# Patient Record
Sex: Female | Born: 1960 | Race: White | Hispanic: Yes | Smoking: Never smoker
Health system: Southern US, Community
[De-identification: ages and names within clinical notes are randomized; demographics above are authoritative.]

## PROBLEM LIST (undated history)

## (undated) DIAGNOSIS — Z8719 Personal history of other diseases of the digestive system: Secondary | ICD-10-CM

## (undated) DIAGNOSIS — R0989 Other specified symptoms and signs involving the circulatory and respiratory systems: Secondary | ICD-10-CM

## (undated) DIAGNOSIS — N920 Excessive and frequent menstruation with regular cycle: Secondary | ICD-10-CM

## (undated) DIAGNOSIS — K2270 Barrett's esophagus without dysplasia: Secondary | ICD-10-CM

## (undated) DIAGNOSIS — Z862 Personal history of diseases of the blood and blood-forming organs and certain disorders involving the immune mechanism: Secondary | ICD-10-CM

## (undated) DIAGNOSIS — N840 Polyp of corpus uteri: Secondary | ICD-10-CM

## (undated) DIAGNOSIS — G4730 Sleep apnea, unspecified: Secondary | ICD-10-CM

## (undated) DIAGNOSIS — R09A2 Foreign body sensation, throat: Secondary | ICD-10-CM

## (undated) DIAGNOSIS — L719 Rosacea, unspecified: Secondary | ICD-10-CM

## (undated) DIAGNOSIS — O039 Complete or unspecified spontaneous abortion without complication: Secondary | ICD-10-CM

## (undated) DIAGNOSIS — K219 Gastro-esophageal reflux disease without esophagitis: Secondary | ICD-10-CM

## (undated) DIAGNOSIS — C50912 Malignant neoplasm of unspecified site of left female breast: Secondary | ICD-10-CM

## (undated) DIAGNOSIS — R0602 Shortness of breath: Secondary | ICD-10-CM

## (undated) DIAGNOSIS — R198 Other specified symptoms and signs involving the digestive system and abdomen: Secondary | ICD-10-CM

## (undated) DIAGNOSIS — A048 Other specified bacterial intestinal infections: Secondary | ICD-10-CM

## (undated) DIAGNOSIS — M19049 Primary osteoarthritis, unspecified hand: Secondary | ICD-10-CM

## (undated) DIAGNOSIS — D219 Benign neoplasm of connective and other soft tissue, unspecified: Secondary | ICD-10-CM

## (undated) DIAGNOSIS — E785 Hyperlipidemia, unspecified: Secondary | ICD-10-CM

## (undated) HISTORY — DX: Gastro-esophageal reflux disease without esophagitis: K21.9

## (undated) HISTORY — PX: MYOMECTOMY ABDOMINAL APPROACH: SUR870

## (undated) HISTORY — DX: Barrett's esophagus without dysplasia: K22.70

## (undated) HISTORY — DX: Sleep apnea, unspecified: G47.30

## (undated) HISTORY — PX: DILATION AND CURETTAGE OF UTERUS: SHX78

## (undated) HISTORY — DX: Complete or unspecified spontaneous abortion without complication: O03.9

## (undated) HISTORY — PX: MYOMECTOMY: SHX85

## (undated) HISTORY — DX: Other specified symptoms and signs involving the circulatory and respiratory systems: R09.89

## (undated) HISTORY — PX: APPENDECTOMY: SHX54

## (undated) HISTORY — DX: Rosacea, unspecified: L71.9

## (undated) HISTORY — PX: WISDOM TOOTH EXTRACTION: SHX21

## (undated) HISTORY — PX: ESOPHAGOGASTRODUODENOSCOPY: SHX1529

## (undated) HISTORY — DX: Other specified bacterial intestinal infections: A04.8

## (undated) HISTORY — DX: Excessive and frequent menstruation with regular cycle: N92.0

## (undated) HISTORY — DX: Benign neoplasm of connective and other soft tissue, unspecified: D21.9

## (undated) HISTORY — DX: Foreign body sensation, throat: R09.A2

## (undated) HISTORY — DX: Other specified symptoms and signs involving the digestive system and abdomen: R19.8

---

## 2004-03-01 ENCOUNTER — Other Ambulatory Visit: Admission: RE | Admit: 2004-03-01 | Discharge: 2004-03-01 | Payer: Self-pay | Admitting: Obstetrics and Gynecology

## 2004-09-18 ENCOUNTER — Inpatient Hospital Stay (HOSPITAL_COMMUNITY): Admission: AD | Admit: 2004-09-18 | Discharge: 2004-09-18 | Payer: Self-pay | Admitting: Obstetrics and Gynecology

## 2004-09-24 ENCOUNTER — Ambulatory Visit (HOSPITAL_COMMUNITY): Admission: RE | Admit: 2004-09-24 | Discharge: 2004-09-24 | Payer: Self-pay | Admitting: Obstetrics and Gynecology

## 2004-10-14 ENCOUNTER — Emergency Department (HOSPITAL_COMMUNITY): Admission: EM | Admit: 2004-10-14 | Discharge: 2004-10-14 | Payer: Self-pay | Admitting: Emergency Medicine

## 2004-11-04 ENCOUNTER — Other Ambulatory Visit: Admission: RE | Admit: 2004-11-04 | Discharge: 2004-11-04 | Payer: Self-pay | Admitting: Gynecology

## 2004-12-01 ENCOUNTER — Ambulatory Visit: Payer: Self-pay | Admitting: Hematology & Oncology

## 2005-01-25 ENCOUNTER — Ambulatory Visit: Payer: Self-pay | Admitting: Hematology & Oncology

## 2005-03-08 ENCOUNTER — Ambulatory Visit (HOSPITAL_COMMUNITY): Admission: RE | Admit: 2005-03-08 | Discharge: 2005-03-08 | Payer: Self-pay | Admitting: Gynecology

## 2005-05-20 ENCOUNTER — Emergency Department (HOSPITAL_COMMUNITY): Admission: EM | Admit: 2005-05-20 | Discharge: 2005-05-20 | Payer: Self-pay | Admitting: Emergency Medicine

## 2005-07-05 ENCOUNTER — Other Ambulatory Visit: Admission: RE | Admit: 2005-07-05 | Discharge: 2005-07-05 | Payer: Self-pay | Admitting: Gynecology

## 2005-09-21 ENCOUNTER — Inpatient Hospital Stay (HOSPITAL_COMMUNITY): Admission: AD | Admit: 2005-09-21 | Discharge: 2005-09-21 | Payer: Self-pay | Admitting: Gynecology

## 2005-11-09 ENCOUNTER — Observation Stay (HOSPITAL_COMMUNITY): Admission: AD | Admit: 2005-11-09 | Discharge: 2005-11-09 | Payer: Self-pay | Admitting: Gynecology

## 2005-11-09 ENCOUNTER — Encounter (INDEPENDENT_AMBULATORY_CARE_PROVIDER_SITE_OTHER): Payer: Self-pay | Admitting: Specialist

## 2006-07-12 ENCOUNTER — Emergency Department (HOSPITAL_COMMUNITY): Admission: EM | Admit: 2006-07-12 | Discharge: 2006-07-12 | Payer: Self-pay | Admitting: Emergency Medicine

## 2006-08-15 HISTORY — PX: OTHER SURGICAL HISTORY: SHX169

## 2006-09-19 ENCOUNTER — Inpatient Hospital Stay (HOSPITAL_COMMUNITY): Admission: RE | Admit: 2006-09-19 | Discharge: 2006-09-21 | Payer: Self-pay | Admitting: Obstetrics and Gynecology

## 2006-09-19 ENCOUNTER — Encounter (INDEPENDENT_AMBULATORY_CARE_PROVIDER_SITE_OTHER): Payer: Self-pay | Admitting: *Deleted

## 2008-05-07 ENCOUNTER — Other Ambulatory Visit: Admission: RE | Admit: 2008-05-07 | Discharge: 2008-05-07 | Payer: Self-pay | Admitting: Gynecology

## 2008-08-15 HISTORY — PX: HYSTEROSCOPY: SHX211

## 2009-04-10 ENCOUNTER — Encounter (INDEPENDENT_AMBULATORY_CARE_PROVIDER_SITE_OTHER): Payer: Self-pay | Admitting: Obstetrics and Gynecology

## 2009-04-10 ENCOUNTER — Ambulatory Visit (HOSPITAL_COMMUNITY): Admission: RE | Admit: 2009-04-10 | Discharge: 2009-04-10 | Payer: Self-pay | Admitting: Obstetrics and Gynecology

## 2009-04-10 HISTORY — PX: OTHER SURGICAL HISTORY: SHX169

## 2010-09-05 ENCOUNTER — Encounter: Payer: Self-pay | Admitting: Obstetrics and Gynecology

## 2010-11-20 LAB — CBC
HCT: 36.3 % (ref 36.0–46.0)
MCHC: 34.3 g/dL (ref 30.0–36.0)
MCV: 85.4 fL (ref 78.0–100.0)
Platelets: 211 10*3/uL (ref 150–400)
RDW: 18.2 % — ABNORMAL HIGH (ref 11.5–15.5)

## 2010-11-20 LAB — HCG, SERUM, QUALITATIVE: Preg, Serum: NEGATIVE

## 2010-12-28 NOTE — H&P (Signed)
NAME:  Meagan [name], Meagan [name] NO.:  [id number]   MEDICAL RECORD NO.:  [id number]          PATIENT TYPE:  AMB   LOCATION:  SDC                           FACILITY:  [facility]   PHYSICIAN:  Juluis Mire, M.D.   DATE OF BIRTH:  06/15/1961   DATE OF ADMISSION:  04/10/2009  DATE OF DISCHARGE:                              HISTORY & PHYSICAL   The patient is a 50 year old gravida 4, para 0, abortus 4, presents for  hysteroscopy along with diagnostic laparoscopy standby.   The patient is being managed by Dr. Teodora Medici for infertility and  recurrent pregnancy loss.  We did a saline infusion ultrasound, revealed  an endometrial polypoid mass and some small recurrent fibroids.  She is  having pain with intercourse, which is making it difficult to conceive.  She had a previous myomectomy in 2008.  Because of this, we are going to  proceed with hysteroscopic evaluation and diagnostic laparoscopy.   ALLERGIES:  In terms of allergies, no known drug allergies.   MEDICATIONS:  None.   PAST MEDICAL HISTORY:  Usual childhood diseases.   SURGERY:  In terms of surgery, had previous myomectomy and has had 4  miscarriages.   FAMILY HISTORY:  Noncontributory.   SOCIAL HISTORY:  No tobacco or alcohol use.   REVIEW OF SYSTEMS:  Noncontributory.   PHYSICAL EXAMINATION:  GENERAL:  The patient is afebrile.  VITAL SIGNS:  Stable.  HEENT:  The patient is normocephalic.  Pupils are equal, round, and  reactive to light and accommodation.  Extraocular movements were intact.  Sclerae and conjunctivae were clear.  Oropharynx clear.  NECK:  Without thyromegaly.  BREASTS:  Not examined.  LUNGS:  Clear.  CARDIOVASCULAR SYSTEM:  Regular rate without murmurs or gallops.  ABDOMEN:  Benign.  No mass, organomegaly, or tenderness.  PELVIC:  Normal external genitalia.  Vaginal mucosa clear.  Cervix  unremarkable.  Uterus normal size, shape, and contour.  Adnexa free of  masses or tenderness.  EXTREMITIES:  Trace edema.  NEUROLOGIC:  Grossly within normal limits.   IMPRESSION:  1. Endometrial polyp.  2. Dyspareunia and pelvic pain, possibly secondary to pelvic      adhesions.  3. Prior myomectomy.  4. History of recurrent pregnancy losses.   PLAN:  The patient to undergo hysteroscopy along with diagnostic  laparoscopy.  The risks of surgery have been discussed including the  risk of infection.  Risk of hemorrhage that could require transfusion  with the risk of AIDS or hepatitis.  The risk of injury to adjacent  organs including bladder, bowel, ureters that could require further  exploratory surgery.  Risk of deep venous thrombosis and pulmonary  embolus.  The patient expressed understanding the potential risks and  complications.      Juluis Mire, M.D.  Electronically Signed     JSM/MEDQ  D:  04/10/2009  T:  04/10/2009  Job:  [id number]

## 2010-12-28 NOTE — Op Note (Signed)
Meagan [name], Meagan [name]                   ACCOUNT NO.:  [id number]   MEDICAL RECORD NO.:  [id number]          PATIENT TYPE:  AMB   LOCATION:  SDC                           [facility]   PHYSICIAN:  Juluis Mire, M.D.   DATE OF BIRTH:  Feb 24, 1961   DATE OF PROCEDURE:  04/10/2009  DATE OF DISCHARGE:                               OPERATIVE REPORT   PREOPERATIVE DIAGNOSES:  Endometrial polyp.  Pelvic pain and  dyspareunia.   POSTOPERATIVE DIAGNOSES:  Endometrial polyp.  Pelvic adhesions and  endometriosis.  Uterine fibroids.   OPERATIVE PROCEDURES:  Paracervical block.  Hysteroscopy with resection  of endometrial polyp and endometrial curettings.  Open laparoscopy,  lysis of adhesions and cautery of endometriotic implant.   SURGEON:  Juluis Mire, MD   ANESTHESIA:  General endotracheal.   ESTIMATED BLOOD LOSS:  Minimal.   PACKS AND DRAINS:  None.   INTRAOPERATIVE BLOOD PLACED:  None.   COMPLICATIONS:  None.   INDICATIONS:  As dictated in history and physical.   PROCEDURE IN DETAILS:  The patient was taken to OR and placed in supine  position.  After satisfactory level of general endotracheal anesthesia  was obtained, the patient was placed in the dorsal lithotomy position  using the Allen stirrups.  The abdomen, perineum and vagina were prepped  out with Betadine.  The patient was then draped out for hysteroscopy.  A  speculum was placed in the vaginal vault.  Cervix was grasped with a  single-tooth tenaculum.  A paracervical block with 1% Nesacaine was  instituted.  Uterus sounded to approximately 11 cm.  The cervix was  serially dilated to a size 33 Pratt dilator.  Operative hysteroscope was  introduced.  Intrauterine cavity was distended using sorbitol.  Visualization revealed a polyp extending from the uterine fundus, this  was resected and sent for pathology.  The remaining endometrial cavity  was clear.  Both tubal ostium were visualized, noted to be unremarkable.  Total deficit was 150 mL.  Endometrial curettings were also obtained.  A  Hulka tenaculum was then put in place, and the patient's bladder was  emptied by catheterization.   The patient was draped out for laparoscopy.  Subumbilical incision was  made with a knife and carried through subcutaneous tissue.  The fascia  was entered sharply and extended laterally.  The peritoneum was entered  with blunt pressure.  Open laparoscopic trocar was put in place and  secured.  The abdomen was insufflated with carbon dioxide.  The  laparoscope was introduced.  There was no evidence of injury to adjacent  organs.  A 5-mm trocar was put in place in the suprapubic area under  direct visualization.  Visualization revealed the appendix to be  retrocecal, unremarkable.  There was a few pericecal adhesions.  The  upper abdomen including liver tip of the gallbladder was clear.  Both  lateral gutters were clear.  Uterus was enlarged and somewhat irregular  secondary to uterine fibroids.  On the posterior wall, she had 1 small  subserosal fibroid.  The uterus was elevated.  There was some flimsy  adhesions from the right ovary to the pelvic sidewall and from the end  of the fallopian tube to the pelvic sidewall.  There was a piece of  epiploic from the sigmoid adherent to the left ovary.  The left tube was  unremarkable.  The sigmoid colon was a little adherent to the top of the  pelvic brim.  The cul-de-sac was otherwise clear.  We brought in the  monopolar scissors.  We first removed the piece of epiploic from the  left ovary.  At this point that was free and the tube was nonadherent.  The end of the tube was slightly phimosed, but otherwise looked healthy.  We then took down a little bit of the sigmoid colon from the pelvic  sidewall, so that the tube and ovary would be more free.  We then went  to the right side, elevated the ovary, we took down the flimsy adhesions  using the unipolar scissors.  At this  point in time, the ovary and right  were free.  The 1 implant of endometriosis on the uterine fundus was  also cauterized.  At this point in time, we visualized, had excellent  hemostasis.  Both tubes and ovaries were adequately free.  There was no  evidence of other pelvic pathology.  The abdomen was deflated with  carbon dioxide.  All trocars were removed.  Subumbilical fascia was  closed with figure-of-eight of 0 Vicryl.  Skin was closed with  interrupted subcuticulars of 4-0 Vicryl.  Suprapubic incision was closed  with Dermabond.  The Hulka tenaculum was removed.  The patient was  recatheterized.  Urine was clear.  At this point in time, the patient  was taken out of the dorsal lithotomy position.  Once alert and  extubated, transferred to recovery room in good condition.  Sponge,  instrument, and needle count reported as correct by circulating nurse  x2.      Juluis Mire, M.D.  Electronically Signed     JSM/MEDQ  D:  04/10/2009  T:  04/10/2009  Job:  [id number]

## 2010-12-31 NOTE — H&P (Signed)
Meagan [name], Meagan [name]                   ACCOUNT NO.:  [id number]   MEDICAL RECORD NO.:  [id number]          PATIENT TYPE:  INP   LOCATION:                                [facility]   PHYSICIAN:  Ivor Costa. Farrel Gobble, M.D. DATE OF BIRTH:  07-Jan-1961   DATE OF ADMISSION:  DATE OF DISCHARGE:                                HISTORY & PHYSICAL   CHIEF COMPLAINT:  PPROM at 15 weeks.   HISTORY OF PRESENT ILLNESS:  The patient is a 50 year old G3, P0 with an  estimated date of confinement of May 03, 2006, estimated gestational  age of [redacted] weeks who had been followed in the office closely in this  pregnancy because of multiple uterine fibroids and heavy vaginal bleeding  felt to be secondary to that.  The patient also was noted to have a large  subchorionic hematoma.  The patient ruptured four days ago, but failed to  call the office thinking it was release of the hematoma; however, she was  due to have a follow-up ultrasound to follow up the hematoma last week and  presented now for follow-up.  Her ultrasound unfortunately today is  consistent with PPROM.  There is anhydramnios with a fetus at 15 weeks in  the frank breech presentation.  Fetal heart rate was seen today.  The  hematoma also is present, but is markedly smaller in size.  The findings  were discussed with the patient as well as the gravity of the situation and  they elected to end the pregnancy at this point.  The patient was therefore  transferred from the office to labor and delivery for induction of labor.  Her pregnancy has been complicated, as mentioned above, by multiple uterine  fibroids, the four largest of which are between 6-8 cm; however, there are  at least eight fibroids that were seen by ultrasound and as mentioned above,  a large hematoma that has been followed closely in the pregnancy.  She is  A+.  Antibody negative.  RPR nonreactive.  Rubella immune.  Hepatitis B  surface nonreactive.  HIV nonreactive.  Of note,  the patient's AFP came back  elevated with a Down syndrome risk of 1 in 35 this morning.   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  The patient has multiple fibroid  uterus and had declined a multiple myomectomy in the past.  Her Pap smears  have been normal.  Please refer to the [facility].   PHYSICAL EXAMINATION:  GENERAL:  She is an appropriately sad-appearing  gravida in no acute distress.  HEART:  Regular rate.  LUNGS:  Clear to auscultation.  ABDOMEN:  Markedly large size greater than dates.  The fundal area was at  the xiphoid.  Multiple fibroids are palpable.  The uterus is nontender.  PELVIC:  Sterile speculum examination:  There is no fluid in the vagina.  The cervix is slightly open.  Bimanual examination:  The cervix is open  fingertip to 1 with long length.  Fetal  part was not palpable.   ASSESSMENT:  Premature preterm rupture of membranes with anhydramnios at 15  weeks with multiple uterine fibroids.  The patient was admitted to labor and  delivery for Cytotec induction of labor.  She is aware of increased  risk for postpartum hemorrhage and will therefore be typed and crossed for 2  units.  In addition, the patient will be started on antibiotics because of  the prolonged rupture.  Routine laboratories will otherwise be obtained  today.  All questions were addressed.      Ivor Costa. Farrel Gobble, M.D.  Electronically Signed     THL/MEDQ  D:  11/09/2005  T:  11/09/2005  Job:  [id number]

## 2010-12-31 NOTE — Op Note (Signed)
NAME:  Meagan [name], Meagan [name]            ACCOUNT NO.:  [id number]   MEDICAL RECORD NO.:  [id number]          PATIENT TYPE:  INP   LOCATION:  9160                          [facility]ACILITY:  [facility]   PHYSICIAN:  Juan H. Lily Peer, M.D.DATE OF BIRTH:  1961/04/15   DATE OF PROCEDURE:  11/09/2005  DATE OF DISCHARGE:                                 OPERATIVE REPORT   HISTORY:  The patient is a 50 year old, gravida 3, para 0 currently [redacted] weeks  gestation, who had presented to the office this morning complaining of  ruptured membranes since approximately three days ago.  The patient with  known history of leiomyomatous uteri had a subcarina hematoma.  The patient  was admitted for evacuation of fetus due to preterm premature rupture of  membranes and leiomyomatous uteri.  The patient had been typed and crossed  for two units of packed red blood cells.  She received Cytotec 200 mcg q.4h.  and received two doses.  She had an epidural for pain relief.  The patient  had 2047 hours the fetus is believed to be expressed in the introitus.  At  2057, delivery of the fetus.  Of note, the head separated from the rest of  the torso requiring application of ovum forceps to remove the fetus to  reconstruct the entire fetus and the placenta and cord was removed  afterwards.  The patient was started on Pitocin drip as a uterotonic agent.  She had received a gram of cefoxitin prophylactically on admission.  Her  vital signs were stable.   The patient will be observed for a few hours in the hospital and plan is to  have her continue on Methergine 0.2 mg IM q.4h. and we will place her on  Keflex 500 mg q.i.d. for a 10-day course.  I have given her a prescription  for Lortab 7.5/500 to take 1 p.o. q.4-6h. p.r.n. pain.  The patient's blood  was A positive.  Aerobic and anaerobic cultures were obtained from inside  the cervical canal intrauterine segment.  Also, a piece of fascia lata was  removed and submitted for chromosomal  analysis.  The father of the baby  visualized the fetus but we thought that the impact on the mother due to  separation of the head from the rest of the body would be too traumatic for  her, although footprints will be provided for her for her memory.  It  appears that the fetus was a female on gross inspection.  No other gross  abnormalities was noted at this time.  We will await for the chromosomal  analysis.  Of note, the patient was informed today that she when she had her  first trimester screen that she her fetus was at risk for aneuploidy.  The  patient will be placed on Lupron 11.25 mg IM when she is seen in the office  in two weeks and plan no proceeding with an abdominal myomectomy in three  months and once we get her iron levels and also plan on some form of iron  supplementation so that we can offer the patient to donate her  blood for  possible autologous transfusion if needed at the time of myomectomy.      Juan H. Lily Peer, M.D.  Electronically Signed     JHF/MEDQ  D:  11/09/2005  T:  11/09/2005  Job:  [id number]

## 2010-12-31 NOTE — H&P (Signed)
NAME:  Meagan [name], Meagan [name] NO.:  [id number]   MEDICAL RECORD NO.:  [id number]          PATIENT TYPE:  INP   LOCATION:                                [facility]   PHYSICIAN:  Juluis Mire, M.D.   DATE OF BIRTH:  07-21-1961   DATE OF ADMISSION:  09/19/2006  DATE OF DISCHARGE:                              HISTORY & PHYSICAL   HISTORY:  The patient is 50 year old gravida 4, para 0, abortus 49 female  who presents for myomectomy.   The patient has had a history of recurrent pregnancy losses.  Does have  multiple fibroids.  In view of this the patient now presents for  myomectomy.  She notes that her cycles are approximately 8 days.  She  had does have 4 days of heavy flow with associated back and pelvic  pressure.  We had done a saline infusion ultrasound that did reveal  multiple fibroids.  She does have one large fibroid with endometrial  compression.   ALLERGIES:  NO KNOWN DRUG ALLERGIES.   MEDICATIONS:  None.   PAST MEDICAL HISTORY:  Usual childhood diseases.  No significant  sequelae.  No previous surgical history.   OBSTETRICAL HISTORY:  She is had four miscarriages.   FAMILY HISTORY:  Noncontributory.   SOCIAL HISTORY:  No tobacco or alcohol use.  Review of systems is  noncontributory.   PHYSICAL EXAMINATION:  VITAL SIGNS:  The patient afebrile, stable vital  signs.  HEENT EXAMINATION:  The patient is normocephalic.  Pupils equal, round,  react to light accommodation.  LUNGS:  Clear.  CARDIOVASCULAR SYSTEM:  Regular rate without murmurs or gallops.  ABDOMINAL EXAM:  Benign.  No mass, organomegaly or tenderness.  PELVIC:  Normal external genitalia.  Vaginal is clear.  Cervix  unremarkable.  Uterus approximately 12 weeks in size irregular,  consistent with known fibroids.  Adnexa unremarkable.  EXTREMITIES:  Trace edema.  NEUROLOGICAL EXAM:  Grossly within normal limits.   IMPRESSION:  1. Multiple fibroids with associated symptomatology.  2.  History of recurrent pregnancy losses.   PLAN:  The patient will undergo exploratory surgery with myomectomy.  Risks of surgery have been discussed; including the risk of infection,  the risk of hemorrhage that could require transfusion, the risk of AIDS  or hepatitis, risk of injury to adjacent organs including bladder,  bowel, or ureters that could require further exploratory surgery; the  risk of deep venous thrombosis and pulmonary embolus.  She does  understand the potential for recurrent  fibroids, bleeding, continued symptomatology.  Finally we discussed that  removing the fibroids may not completely relieve this issue of  miscarriage.  Due to her age and surgery she could lead to increase  issues with infertility.  The patient understands the indications and  risks.      Juluis Mire, M.D.  Electronically Signed     JSM/MEDQ  D:  09/19/2006  T:  09/19/2006  Job:  [id number]

## 2010-12-31 NOTE — Op Note (Signed)
NAME:  Meagan [name], Meagan [name]            ACCOUNT NO.:  [id number]   MEDICAL RECORD NO.:  [id number]          PATIENT TYPE:  AMB   LOCATION:  SDC                           [facility]   PHYSICIAN:  Juluis Mire, M.D.   DATE OF BIRTH:  Jul 03, 1961   DATE OF PROCEDURE:  09/19/2006  DATE OF DISCHARGE:                               OPERATIVE REPORT   PREOPERATIVE DIAGNOSIS:  Uterine fibroids.   POSTOPERATIVE DIAGNOSES:  1. Uterine fibroids.  2. Pelvic adhesions.  3. Cystic enlargement of the left ovary.   PROCEDURE:  Exploratory laparotomy with multiple myomectomies, lysis of  adhesions, left ovarian cystectomy.   SURGEON:  Juluis Mire, M.D.   ASSISTANT:  Duke Salvia. Marcelle Overlie, M.D.   ANESTHESIA:  General endotracheal.   ESTIMATED BLOOD LOSS:  200-300 mL.   PACKS AND DRAINS:  None.   INTRAOPERATIVE BLOOD PLACED:  None.   COMPLICATIONS:  None.   INDICATIONS:  Dictated in the history and physical.   DESCRIPTION OF PROCEDURE:  Patient taken to the OR and placed in the  supine position.  After satisfactory level of general endotracheal  anesthesia was obtained, the patient's abdomen was prepped out with  Betadine.  Foley was placed to straight drain.  Patient was then draped  in sterile field.  Low transverse skin incision was made with a knife  and carried through subcutaneous tissue.  Fascia was identified and  entered sharply and incision in fascia extended laterally.  The fascia  was taken off the muscle superiorly and inferiorly.  Rectus muscles were  separated in the midline.  Peritoneum was entered sharply, incision of  perineum extended both superiorly and inferiorly.  The uterus was then  delivered through the incision.  Multiple fibroids were noted.  She had  dense adhesions particularly involving the left tube and ovary and the  ovary had two cystic enlargement areas that looked initially like  endometriomas.  At this point in time we performed multiple  myomectomies  over the uterus.  We had two incisions anteriorly to posteriorly, the  deep parts of the incisions were closed with figure-of-eights of 0  Vicryl.  The superficial serosa was closed with running suture of 3-0  Monocryl.  She had two pedunculated fibroids on the fundal area.  These  were excised and serosa was closed with a running suture of 3-0  Monocryl.  After this, it felt like all fibroids had been adequately  removed.  Small seedlings were removed from the surface of the uterus.  We had fairly good hemostasis and clear urine output at this point in  time.  We then performed lysis of adhesions involving the left tube and  ovary.  Left tube, the fimbriated end actually looked okay.  We then  removed two cysts from the left ovary.  These appeared to be simple in  nature with clear fluid.  The cortex was then closed with a running  suture of 4-0 Monocryl.  We had good hemostasis at this point, clear  urine output and all fibroids had been adequately removed.  We did not  feel like we  entered the endometrial cavity during the procedure, but  again there were numerous fibroids removed, so if the patient concedes,  cesarean section would be indicated.  At this point in time, the uterus  returned to the abdominal cavity.  Appendix visualized and noted to be  normal.  Interceed was put in place to hopefully prevent adhesions.  At  this point in time, perineum was closed with suture of 3-0 Vicryl.  Muscles were closed with interrupted sutures of 3-0 Vicryl, fascia  closed with a suture of 0 PDS.  Skin was closed staples and Steri-  Strips.  Sponge, instrument and needle count was reported as correct by  circulating nurse x2.  Foley catheter remained clear at time of closure.  The patient tolerated the procedure well and was returned to the  recovery room in good condition.      Juluis Mire, M.D.  Electronically Signed     JSM/MEDQ  D:  09/19/2006  T:  09/19/2006  Job:   [id number]

## 2010-12-31 NOTE — Discharge Summary (Signed)
NAME:  Meagan [name], Meagan [name]            ACCOUNT NO.:  [id number]   MEDICAL RECORD NO.:  [id number]          PATIENT TYPE:  INP   LOCATION:  9317                          [facility]ILITY[facility]   PHYSICIAN:  Juluis Mire, M.D.   DATE OF BIRTH:  11/16/1960   DATE OF ADMISSION:  09/19/2006  DATE OF DISCHARGE:  09/21/2006                               DISCHARGE SUMMARY   ADMISSION DIAGNOSIS:  Uterine fibroids.   POSTOPERATIVE DIAGNOSES:  1. Uterine fibroids.  2. Pelvic adhesions.  3. Left ovarian cyst.   OPERATIVE PROCEDURE:  Exploratory laparotomy with multiple myomectomies.  Lysis of adhesions. Left ovarian cystectomy.   For complete history and physical, please see dictated note.   COURSE IN THE HOSPITAL:  The patient underwent above-noted surgery.  Postoperative did well. Postoperative hemoglobin 8.6. At the time of  discharge, was afebrile with stable vital signs. Abdomen was soft. Bowel  sounds were active. She was passing flatus. Low-transverse incision was  intact. Voiding without difficulty.   In terms of complications, none encountered during stay in hospital.  Patient discharged home in stable condition.   DISPOSITION:  Routine postoperative instructions were given. She is to  avoid heavy lifting or driving a car. Discharged home on Tylox as needed  for pain. She is watch for signs of infection, nausea, vomiting or  increasing abdominal pain or discomfort.      Juluis Mire, M.D.  Electronically Signed     JSM/MEDQ  D:  09/21/2006  T:  09/21/2006  Job:  [id number]

## 2011-08-16 HISTORY — PX: POLYPECTOMY: SHX149

## 2011-08-22 NOTE — H&P (Addendum)
51 yo with menorrhagia and endometrial polyp presents for surgical mngt  PMHx:  Infertility, increase cholesterol PSHx:  Myomectomy, laparoscopy Shx:  Negative tobacco and drug use Meds:  lipitor All:  None  AF, VSS Gen - NAD CV - RRR Lungs - clear Abd - soft, NT PV - defered  [place] - endometrial mass noted after saline infusion.  Ovaries appear wnl  A/P:  Menorrhagia, endometrial mass - suspect polyp Hysteroscopy with dilation and curettage.  R/B/A d/w patient  Discussed procedure with pt and questions answered.  Informed consent.

## 2011-08-23 ENCOUNTER — Encounter (HOSPITAL_BASED_OUTPATIENT_CLINIC_OR_DEPARTMENT_OTHER): Payer: Self-pay | Admitting: *Deleted

## 2011-08-23 NOTE — Progress Notes (Signed)
NPO AFTER MN. ARRIVES AT 0600. NEEDS CBC, URINE PREG. AND EKG.

## 2011-08-25 ENCOUNTER — Encounter (HOSPITAL_BASED_OUTPATIENT_CLINIC_OR_DEPARTMENT_OTHER): Payer: Self-pay | Admitting: Anesthesiology

## 2011-08-25 ENCOUNTER — Encounter (HOSPITAL_BASED_OUTPATIENT_CLINIC_OR_DEPARTMENT_OTHER): Payer: Self-pay | Admitting: *Deleted

## 2011-08-25 ENCOUNTER — Other Ambulatory Visit: Payer: Self-pay | Admitting: Obstetrics and Gynecology

## 2011-08-25 ENCOUNTER — Ambulatory Visit (HOSPITAL_BASED_OUTPATIENT_CLINIC_OR_DEPARTMENT_OTHER): Payer: PRIVATE HEALTH INSURANCE | Admitting: Anesthesiology

## 2011-08-25 ENCOUNTER — Ambulatory Visit (HOSPITAL_BASED_OUTPATIENT_CLINIC_OR_DEPARTMENT_OTHER)
Admission: RE | Admit: 2011-08-25 | Discharge: 2011-08-25 | Disposition: A | Discharge status: Home / Self Care | Payer: PRIVATE HEALTH INSURANCE | Source: Ambulatory Visit | Attending: Obstetrics and Gynecology | Admitting: Obstetrics and Gynecology

## 2011-08-25 ENCOUNTER — Encounter (HOSPITAL_BASED_OUTPATIENT_CLINIC_OR_DEPARTMENT_OTHER)
Admission: RE | Disposition: A | Discharge status: Home / Self Care | Payer: Self-pay | Source: Ambulatory Visit | Attending: Obstetrics and Gynecology

## 2011-08-25 DIAGNOSIS — E7800 Pure hypercholesterolemia, unspecified: Secondary | ICD-10-CM | POA: Insufficient documentation

## 2011-08-25 DIAGNOSIS — Z79899 Other long term (current) drug therapy: Secondary | ICD-10-CM | POA: Insufficient documentation

## 2011-08-25 DIAGNOSIS — N840 Polyp of corpus uteri: Secondary | ICD-10-CM | POA: Insufficient documentation

## 2011-08-25 DIAGNOSIS — N979 Female infertility, unspecified: Secondary | ICD-10-CM | POA: Insufficient documentation

## 2011-08-25 DIAGNOSIS — N920 Excessive and frequent menstruation with regular cycle: Secondary | ICD-10-CM | POA: Insufficient documentation

## 2011-08-25 HISTORY — PX: HYSTEROSCOPY WITH D & C: SHX1775

## 2011-08-25 HISTORY — DX: Hyperlipidemia, unspecified: E78.5

## 2011-08-25 HISTORY — DX: Polyp of corpus uteri: N84.0

## 2011-08-25 HISTORY — DX: Personal history of diseases of the blood and blood-forming organs and certain disorders involving the immune mechanism: Z86.2

## 2011-08-25 HISTORY — DX: Primary osteoarthritis, unspecified hand: M19.049

## 2011-08-25 LAB — HCG, QUANTITATIVE, PREGNANCY: hCG, Beta Chain, Quant, S: <1 m[IU]/mL (ref <5)

## 2011-08-25 LAB — CBC
MCH: 27.1 pg (ref 26.0–34.0)
MCHC: 32.8 g/dL (ref 30.0–36.0)
Platelets: 247 10*3/uL (ref 150–400)
RDW: 15.7 % — ABNORMAL HIGH (ref 11.5–15.5)

## 2011-08-25 SURGERY — DILATATION AND CURETTAGE /HYSTEROSCOPY
Anesthesia: Spinal | Site: Uterus | Wound class: Clean Contaminated

## 2011-08-25 MED ORDER — HYDROCODONE-ACETAMINOPHEN 5-325 MG PO TABS
1.0000 | ORAL_TABLET | Freq: Once | ORAL | Status: AC
  Administered 2011-08-25: 1 via ORAL

## 2011-08-25 MED ORDER — FENTANYL CITRATE 0.05 MG/ML IJ SOLN
INTRAMUSCULAR | Status: DC | PRN
  Administered 2011-08-25: 50 ug via INTRAVENOUS
  Administered 2011-08-25: 25 ug via INTRAVENOUS

## 2011-08-25 MED ORDER — DEXTROSE 5 % IV SOLN
1.0000 g | INTRAVENOUS | Status: AC
  Administered 2011-08-25: 1 g via INTRAVENOUS

## 2011-08-25 MED ORDER — GLYCINE 1.5 % IR SOLN
Status: DC | PRN
  Administered 2011-08-25: 3000 mL

## 2011-08-25 MED ORDER — HYDROCODONE-ACETAMINOPHEN 5-500 MG PO TABS: 1.0000 | ORAL_TABLET | Freq: Four times a day (QID) | ORAL | Status: AC | PRN

## 2011-08-25 MED ORDER — LIDOCAINE HCL 1 % IJ SOLN
INTRAMUSCULAR | Status: DC | PRN
  Administered 2011-08-25: 10 mL

## 2011-08-25 MED ORDER — LACTATED RINGERS IV SOLN: INTRAVENOUS | Status: DC

## 2011-08-25 MED ORDER — FENTANYL CITRATE 0.05 MG/ML IJ SOLN: 25.0000 ug | INTRAMUSCULAR | Status: DC | PRN

## 2011-08-25 MED ORDER — PROMETHAZINE HCL 25 MG/ML IJ SOLN: 6.2500 mg | INTRAMUSCULAR | Status: DC | PRN

## 2011-08-25 MED ORDER — MIDAZOLAM HCL 5 MG/5ML IJ SOLN
INTRAMUSCULAR | Status: DC | PRN
  Administered 2011-08-25 (×2): 1 mg via INTRAVENOUS

## 2011-08-25 MED ORDER — MEPERIDINE HCL 25 MG/ML IJ SOLN: 6.2500 mg | INTRAMUSCULAR | Status: DC | PRN

## 2011-08-25 MED ORDER — BUPIVACAINE HCL 0.75 % IJ SOLN
INTRAMUSCULAR | Status: DC | PRN
  Administered 2011-08-25: 1.4 mL via INTRATHECAL

## 2011-08-25 MED ORDER — LACTATED RINGERS IV SOLN
INTRAVENOUS | Status: DC
  Administered 2011-08-25 (×2): via INTRAVENOUS

## 2011-08-25 SURGICAL SUPPLY — 33 items
CANISTER SUCTION 2500CC (MISCELLANEOUS) ×2 IMPLANT
CATH ROBINSON RED A/P 16FR (CATHETERS) IMPLANT
CLOTH BEACON ORANGE TIMEOUT ST (SAFETY) ×2 IMPLANT
CORD ACTIVE DISPOSABLE (ELECTRODE) ×1
CORD ELECTRO ACTIVE DISP (ELECTRODE) ×1 IMPLANT
COVER TABLE BACK 60X90 (DRAPES) ×2 IMPLANT
DRAPE CAMERA CLOSED 9X96 (DRAPES) ×2 IMPLANT
DRAPE LG THREE QUARTER DISP (DRAPES) ×2 IMPLANT
DRESSING TELFA 8X3 (GAUZE/BANDAGES/DRESSINGS) ×2 IMPLANT
ELECT LOOP GYNE PRO 24FR (CUTTING LOOP) ×2
ELECT REM PT RETURN 9FT ADLT (ELECTROSURGICAL) ×2
ELECT VAPORTRODE GRVD BAR (ELECTRODE) IMPLANT
ELECTRODE LOOP GYNE PRO 24FR (CUTTING LOOP) ×1 IMPLANT
ELECTRODE REM PT RTRN 9FT ADLT (ELECTROSURGICAL) ×1 IMPLANT
ELECTRODE ROLLER BARREL 22FR (ELECTROSURGICAL) IMPLANT
ELECTRODE VAPORCUT 22FR (ELECTROSURGICAL) IMPLANT
GLOVE BIO SURGEON STRL SZ 6.5 (GLOVE) ×1 IMPLANT
GLOVE BIOGEL PI IND STRL 7.0 (GLOVE) ×2 IMPLANT
GLOVE BIOGEL PI INDICATOR 7.0 (GLOVE) ×2
GLOVE ECLIPSE 6.5 STRL STRAW (GLOVE) ×1 IMPLANT
GLOVE INDICATOR 6.5 STRL GRN (GLOVE) ×1 IMPLANT
GLYCINE 1.5% IRRIG UROMATIC (IV SOLUTION) ×2 IMPLANT
GOWN PREVENTION PLUS LG XLONG (DISPOSABLE) ×2 IMPLANT
GOWN STRL NON-REIN LRG LVL3 (GOWN DISPOSABLE) ×1 IMPLANT
LEGGING LITHOTOMY PAIR STRL (DRAPES) ×2 IMPLANT
LOOP ANGLED CUTTING 22FR (CUTTING LOOP) IMPLANT
PACK BASIN DAY SURGERY FS (CUSTOM PROCEDURE TRAY) ×2 IMPLANT
PAD OB MATERNITY 4.3X12.25 (PERSONAL CARE ITEMS) ×2 IMPLANT
PAD PREP 24X48 CUFFED NSTRL (MISCELLANEOUS) ×2 IMPLANT
TOWEL OR 17X24 6PK STRL BLUE (TOWEL DISPOSABLE) ×4 IMPLANT
TRAY DSU PREP LF (CUSTOM PROCEDURE TRAY) ×2 IMPLANT
TUBING HYDROFLEX HYSTEROSCOPY (TUBING) ×2 IMPLANT
WATER STERILE IRR 500ML POUR (IV SOLUTION) ×2 IMPLANT

## 2011-08-25 NOTE — Anesthesia Procedure Notes (Signed)
Spinal  Patient location during procedure: OR Staffing Anesthesiologist: Phillips Grout Performed by: anesthesiologist  Preanesthetic Checklist Completed: patient identified, site marked, surgical consent, pre-op evaluation, timeout performed, IV checked, risks and benefits discussed and monitors and equipment checked Spinal Block Patient position: sitting Prep: Betadine Patient monitoring: heart rate, continuous pulse ox and blood pressure Approach: midline Location: L3-4 Injection technique: single-shot Needle Needle type: Spinocan and Pencil-Tip  Needle gauge: 24 G Needle length: 9 cm Needle insertion depth: 5 cm Additional Notes Expiration date of kit checked and confirmed. Patient tolerated procedure well, without complications.

## 2011-08-25 NOTE — Transfer of Care (Signed)
Immediate Anesthesia Transfer of Care Note  Patient: Meagan [name]  Procedure(s) Performed:  DILATATION AND CURETTAGE /HYSTEROSCOPY  Patient Location: PACU  Anesthesia Type: MAC/SAB  Level of Consciousness: awake, alert , oriented and patient cooperative  Airway & Oxygen Therapy: Patient Spontanous Breathing and Patient connected to face mask oxygen  Post-op Assessment: Report given to PACU RN and Post -op Vital signs reviewed and stable  Post vital signs: Reviewed and stable  Complications: No apparent anesthesia complications

## 2011-08-25 NOTE — Anesthesia Preprocedure Evaluation (Addendum)
Anesthesia Evaluation  Patient identified by MRN, date of birth, ID band Patient awake    Reviewed: Allergy & Precautions, H&P , NPO status , Patient's Chart, lab work & pertinent test results  Airway Mallampati: II TM Distance: >3 FB Neck ROM: full    Dental No notable dental hx.    Pulmonary neg pulmonary ROS,  clear to auscultation  Pulmonary exam normal       Cardiovascular Exercise Tolerance: Good neg cardio ROS regular Normal    Neuro/Psych Negative Neurological ROS  Negative Psych ROS   GI/Hepatic negative GI ROS, Neg liver ROS,   Endo/Other  Negative Endocrine ROS  Renal/GU negative Renal ROS  Genitourinary negative   Musculoskeletal   Abdominal   Peds  Hematology negative hematology ROS (+)   Anesthesia Other Findings   Reproductive/Obstetrics negative OB ROS                           Anesthesia Physical Anesthesia Plan  ASA: I  Anesthesia Plan: Spinal   Post-op Pain Management:    Induction:   Airway Management Planned:   Additional Equipment:   Intra-op Plan:   Post-operative Plan:   Informed Consent: I have reviewed the patients History and Physical, chart, labs and discussed the procedure including the risks, benefits and alternatives for the proposed anesthesia with the patient or authorized representative who has indicated his/her understanding and acceptance.   Dental Advisory Given  Plan Discussed with: CRNA  Anesthesia Plan Comments: (Pt drank water at 0530. Agrees to SAB)       Anesthesia Quick Evaluation

## 2011-08-25 NOTE — Op Note (Signed)
Pre op dx:  Menorrhagia, endometrial mass  Post op dx:  Same, path pending  Procedure:  Cervical block, hysteroscopy, D&C  Surgeon:  Zelphia Cairo, MD  EBL:  minimal  Fluid Deficit:  50cc, glycine  Specimen:  EMC  Complications:  none  Condition:  stable  Anesthesia:  Spinal, IV sedation  Pt was taken to the operating room with IV running.  She was given anesthesia and placed in the dorsal lithotomy position using allen stirrups.  Prepped and draped in sterile fashion.  In and out catheter used to drain bladder for clear urine. 1 cc of 1% lidocaine used at 12 oclock of the cervix and the single tooth tenaculum placed.  The remaining 9cc used to perform a cervical block.  Cervix was serially dilated using pratt dilators.  Diagnostic hysteroscopy inserted and a survey of the endometrial cavity performed.  Multiple endometrial masses noted.  Hysteroscope removed and gentle curettage performed.  Specimen placed on telfa and passed off to be sent to pathology.  Hysteroscope re-inserted and no further masses noted. Hysteroscope was removed. tenaculum was removed.  Cervix was hemostatic.  Speculum was removed and pt was taken to the recovery room in stable condition.

## 2011-08-25 NOTE — Anesthesia Postprocedure Evaluation (Signed)
  Anesthesia Post-op Note  Patient: Meagan [name]  Procedure(s) Performed:  DILATATION AND CURETTAGE /HYSTEROSCOPY  Patient Location: PACU  Anesthesia Type: Spinal  Level of Consciousness: awake and alert   Airway and Oxygen Therapy: Patient Spontanous Breathing  Post-op Pain: mild  Post-op Assessment: Post-op Vital signs reviewed, Patient's Cardiovascular Status Stable, Respiratory Function Stable, Patent Airway and No signs of Nausea or vomiting  Post-op Vital Signs: stable  Complications: No apparent anesthesia complications

## 2011-08-26 ENCOUNTER — Encounter (HOSPITAL_BASED_OUTPATIENT_CLINIC_OR_DEPARTMENT_OTHER): Payer: Self-pay | Admitting: Obstetrics and Gynecology

## 2012-07-31 ENCOUNTER — Telehealth: Payer: Self-pay | Admitting: Obstetrics and Gynecology

## 2012-07-31 NOTE — Telephone Encounter (Signed)
Tc to pt. Informed pt may keep appt. Pt agrees.

## 2012-08-01 ENCOUNTER — Ambulatory Visit (INDEPENDENT_AMBULATORY_CARE_PROVIDER_SITE_OTHER): Payer: Self-pay | Admitting: Obstetrics and Gynecology

## 2012-08-01 ENCOUNTER — Encounter: Payer: Self-pay | Admitting: Obstetrics and Gynecology

## 2012-08-01 ENCOUNTER — Other Ambulatory Visit: Payer: Self-pay | Admitting: Obstetrics and Gynecology

## 2012-08-01 ENCOUNTER — Ambulatory Visit (INDEPENDENT_AMBULATORY_CARE_PROVIDER_SITE_OTHER): Payer: PRIVATE HEALTH INSURANCE | Admitting: Obstetrics and Gynecology

## 2012-08-01 VITALS — BP 102/56 | Ht 63.0 in | Wt 137.0 lb

## 2012-08-01 DIAGNOSIS — A048 Other specified bacterial intestinal infections: Secondary | ICD-10-CM | POA: Insufficient documentation

## 2012-08-01 DIAGNOSIS — Z01419 Encounter for gynecological examination (general) (routine) without abnormal findings: Secondary | ICD-10-CM

## 2012-08-01 DIAGNOSIS — E785 Hyperlipidemia, unspecified: Secondary | ICD-10-CM | POA: Insufficient documentation

## 2012-08-01 DIAGNOSIS — Z124 Encounter for screening for malignant neoplasm of cervix: Secondary | ICD-10-CM

## 2012-08-01 NOTE — Progress Notes (Signed)
The patient reports: no complaints  Contraception: s/p myomectomy never able to conceive afterwards  Last mammogram: per pt 06/2011 Normal Last pap: per pt 06/2011 Normal  GC/Chlamydia cultures offered:declined HIV/RPR/HbsAg offered:  declined HSV 1 and 2 glycoprotein offered: declined  Menstrual cycle regular and monthly: Yes between every 24-26 days Menstrual flow normal: Yes  Urinary symptoms: none Normal bowel movements: Yes Reports abuse at home: No:   Subjective:   Meagan [name] is a 51 y.o. female, G5P0050, who presents for an annual exam.     History  Social History . Marital Status: Unknown   Spouse Name: N/A   Number of Children: N/A . Years of Education: N/A  Social History Main Topics . Smoking status: Never Smoker  . Smokeless tobacco: Never Used . Alcohol Use: Yes    Comment: occasional wine  . Drug Use: No . Sexually Active: Yes   Birth Control/ Protection: None  Other Topics Concern . None  Social History Narrative . None   Menstrual cycle:   LMP: Patient's last menstrual period was 07/30/2012.           Cycle:between 24 - 26 days, bleeding for 5-6 days, with 2 heavy days. Able to wear 1 pad every 3 hours. Blood Clots during end of cycle, averaging the size of a quarter. Cramping before cycle starts.   The following portions of the patient's history were reviewed and updated as appropriate: allergies, current medications, past family history, past medical history, past social history, past surgical history and problem list.  Review of Systems Pertinent items are noted in HPI. Breast:Negative for breast lump,nipple discharge or nipple retraction Gastrointestinal: Negative for abdominal pain, change in bowel habits or rectal bleeding Urinary:negative   Objective:   BP 102/56  Ht 5\' 3"  (1.6 m)  Wt 137 lb (62.143 kg)  BMI 24.27 kg/m2  LMP 07/30/2012    Weight:  Wt Readings from Last 1 Encounters: 08/01/12 137 lb (62.143 kg)         BMI: Body  mass index is 24.27 kg/(m^2).  General Appearance: Alert, appropriate appearance for age. No acute distress HEENT: Grossly normal Neck / Thyroid: Supple, no masses, nodes or enlargement Lungs: clear to auscultation bilaterally Back: No CVA tenderness Breast Exam: No masses or nodes.No dimpling, nipple retraction or discharge. Cardiovascular: Regular rate and rhythm. S1, S2, no murmur Gastrointestinal: Soft, non-tender, no masses or organomegaly Pelvic Exam: Vulva and vagina appear normal. Bimanual exam reveals normal  Adnexa.Uterus is enlarged to 12 weeks size and non-tender Rectovaginal: normal rectal, no masses Lymphatic Exam: Non-palpable nodes in neck, clavicular, axillary, or inguinal regions Skin: no rash or abnormalities Neurologic: Normal gait and speech, no tremor  Psychiatric: Alert and oriented, appropriate affect.     Assessment:   Normal gyn exam    Plan:   mammogram pap smear return annually or prn STD screening: declined Contraception:no method Multiple SAB's discussed Fertility and Conception discussed  IVF discussed Feelings of Bloating after surgery discussed GI Dr. Eileen Stanford. Pt states she has apt scheduled MMG @ [facility]    Silverio Lay MD

## 2012-08-01 NOTE — Progress Notes (Signed)
The patient reports: no complaints  Contraception: s/p myomectomy  Last mammogram: per pt 06/2011 Normal Last pap: per pt 06/2011 Normal  GC/Chlamydia cultures offered:declined HIV/RPR/HbsAg offered:  declined HSV 1 and 2 glycoprotein offered: declined  Menstrual cycle regular and monthly: Yes between every 24-26 days Menstrual flow normal: Yes  Urinary symptoms: none Normal bowel movements: Yes Reports abuse at home: No:   Subjective:    Meagan [name] is a 51 y.o. female, G5P0050, who presents for an annual exam.     History   Social History  . Marital Status: Unknown    Spouse Name: N/A    Number of Children: N/A  . Years of Education: N/A   Social History Main Topics  . Smoking status: Never Smoker   . Smokeless tobacco: Never Used  . Alcohol Use: Yes     Comment: occasional wine   . Drug Use: No  . Sexually Active: Yes    Birth Control/ Protection: None   Other Topics Concern  . None   Social History Narrative  . None    Menstrual cycle:   LMP: Patient's last menstrual period was 07/30/2012.           Cycle:between 24 - 26 days, bleeding for 5-6 days, with 2 heavy days. Able to wear 1 pad every 3 hours. Blood Clots during end of cycle, averaging the size of a quarter. Cramping before cycle starts.   The following portions of the patient's history were reviewed and updated as appropriate: allergies, current medications, past family history, past medical history, past social history, past surgical history and problem list.  Review of Systems Pertinent items are noted in HPI. Breast:Negative for breast lump,nipple discharge or nipple retraction Gastrointestinal: Negative for abdominal pain, change in bowel habits or rectal bleeding Urinary:negative   Objective:    BP 102/56  Ht 5\' 3"  (1.6 m)  Wt 137 lb (62.143 kg)  BMI 24.27 kg/m2  LMP 07/30/2012    Weight:  Wt Readings from Last 1 Encounters:  08/01/12 137 lb (62.143 kg)          BMI: Body mass index is  24.27 kg/(m^2).  General Appearance: Alert, appropriate appearance for age. No acute distress HEENT: Grossly normal Neck / Thyroid: Supple, no masses, nodes or enlargement Lungs: clear to auscultation bilaterally Back: No CVA tenderness Breast Exam: No masses or nodes.No dimpling, nipple retraction or discharge. Cardiovascular: Regular rate and rhythm. S1, S2, no murmur Gastrointestinal: Soft, non-tender, no masses or organomegaly Pelvic Exam: Vulva and vagina appear normal. Bimanual exam reveals normal uterus and adnexa. Rectovaginal: normal rectal, no masses Lymphatic Exam: Non-palpable nodes in neck, clavicular, axillary, or inguinal regions Skin: no rash or abnormalities Neurologic: Normal gait and speech, no tremor  Psychiatric: Alert and oriented, appropriate affect.     Assessment:    Normal gyn exam    Plan:    mammogram pap smear return annually or prn STD screening: declined Contraception:no method Multiple SAB's discussed Fertility and Conception discussed  IVF discussed Feelings of Bloating after surgery discussed GI Dr. Eileen Stanford. Pt states she has apt scheduled   Silverio Lay MD

## 2012-08-02 LAB — PAP IG W/ RFLX HPV ASCU

## 2012-08-06 ENCOUNTER — Ambulatory Visit: Payer: Self-pay | Admitting: Family Medicine

## 2012-08-06 ENCOUNTER — Ambulatory Visit: Payer: PRIVATE HEALTH INSURANCE | Admitting: Family Medicine

## 2012-08-09 ENCOUNTER — Ambulatory Visit (INDEPENDENT_AMBULATORY_CARE_PROVIDER_SITE_OTHER): Payer: PRIVATE HEALTH INSURANCE | Admitting: Family Medicine

## 2012-08-09 ENCOUNTER — Encounter: Payer: Self-pay | Admitting: Family Medicine

## 2012-08-09 ENCOUNTER — Other Ambulatory Visit: Payer: Self-pay | Admitting: *Deleted

## 2012-08-09 VITALS — BP 108/73 | HR 74 | Temp 98.6°F | Resp 16 | Ht 68.0 in | Wt 136.0 lb

## 2012-08-09 DIAGNOSIS — K5900 Constipation, unspecified: Secondary | ICD-10-CM

## 2012-08-09 DIAGNOSIS — K219 Gastro-esophageal reflux disease without esophagitis: Secondary | ICD-10-CM

## 2012-08-09 DIAGNOSIS — K5909 Other constipation: Secondary | ICD-10-CM

## 2012-08-09 DIAGNOSIS — K2990 Gastroduodenitis, unspecified, without bleeding: Secondary | ICD-10-CM

## 2012-08-09 DIAGNOSIS — K2970 Gastritis, unspecified, without bleeding: Secondary | ICD-10-CM | POA: Insufficient documentation

## 2012-08-09 MED ORDER — DEXLANSOPRAZOLE 60 MG PO CPDR: 60.0000 mg | DELAYED_RELEASE_CAPSULE | Freq: Every day | ORAL | Status: DC

## 2012-08-09 NOTE — Patient Instructions (Addendum)
Buy OTC generic senakot-S and take 2 tabs every night to help with your constipation.

## 2012-08-09 NOTE — Assessment & Plan Note (Signed)
She'll start a trial of senakot S generic, 2 tabs po qhs.  Therapeutic expectations and side effect profile of medication discussed today.  Patient's questions answered.

## 2012-08-09 NOTE — Progress Notes (Signed)
Office Note 08/09/2012  CC:  Chief Complaint  Patient presents with  . new patient  . seen 3 week ago, 08/02/2012, by GYN doctor, test positive fo  . Nausea    feekubg bi better    HPI:  Meagan [name] is a 51 y.o. [place] female who is here to establish care. Patient's most recent primary MD: none.  GYN (Dr. Thamas Jaegers) Old records in EPIC/HL reviewed prior to or during today's visit.  Describes postprandial bloating, reflux, chest fullness, mild nausea--since 08/2011.  Sx's gradually worsened until her nurse practitioner at [employer] did an H. Pylori test and it was positive and she was rx'd predpack and although it caused GI upset, mild HA, and mild diarrhea she finished the entire course.  She basically says she feels no better and is asking for further e/m.   Rare NSAID use.  Bowels: describes chronic constipation.  Usually has BM every 2-3 days, except during menses she has one daily. Stool is hard and difficult to pass.  No BRBPR.  No hx of hemorrhoids. Tried mirilax and after daily use for a while she didn't see improvement.    Past Medical History  Diagnosis Date  . SAB (spontaneous abortion)   . Hyperlipidemia   . Endometrial polyp   . Endometriosis   . History of iron deficiency anemia   . Acid indigestion   . Arthritis of hand   . Fibroids   . Helicobacter pylori (H. pylori)     positive serology 07/2012    Past Surgical History  Procedure Date  . Myomectomy abdominal approach   . Polypectomy 2013  . Wisdom tooth extraction   . Hysteroscopy 2010  . Exp. lap. left ovarian cystectomy / lysis adhesions/ myomectomy 2008  . D & c hysteroscopy/ polypectomy/ dx laparoscopy/ lysis adhesions/ ablation endometriosis 04-10-2009  . Hysteroscopy w/d&c 08/25/2011    Procedure: DILATATION AND CURETTAGE /HYSTEROSCOPY;  Surgeon: Zelphia Cairo;  Location: [facility];  Service: Gynecology;;  . Myomectomy     Family History  Problem Relation Age of  Onset  . Cancer Father     prostate  . Hypertension Father   . Anuerysm Father   . Heart disease Father   . Cancer Maternal Grandmother     uterus  . Heart disease Mother   . Heart disease Maternal Aunt   . Heart disease Maternal Uncle   . Heart disease Paternal Uncle     History   Social History  . Marital Status: Legally Separated    Spouse Name: N/A    Number of Children: N/A  . Years of Education: N/A   Occupational History  . Not on file.   Social History Main Topics  . Smoking status: Never Smoker   . Smokeless tobacco: Never Used  . Alcohol Use: Yes     Comment: OCCASIONAL  . Drug Use: No  . Sexually Active: Yes    Birth Control/ Protection: None   Other Topics Concern  . Not on file   Social History Narrative   Married but separated.  No children.Orig from [place], [place].  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     Outpatient Encounter Prescriptions as of 08/09/2012  Medication Sig Dispense Refill  . doxycycline (MONODOX) 100 MG capsule Take 100 mg by mouth 2 (two) times daily.      Marland Kitchen doxycycline (MONODOX) 75 MG capsule  Take 75 mg by mouth 2 (two) times daily.      . metroNIDAZOLE (METROGEL) 0.75 % gel Apply topically 2 (two) times daily.      Marland Kitchen atorvastatin (LIPITOR) 20 MG tablet Take 20 mg by mouth daily.      Marland Kitchen dexlansoprazole (DEXILANT) 60 MG capsule Take 1 capsule (60 mg total) by mouth daily.  25 capsule  0  . [DISCONTINUED] amoxicillin-clarithromycin-lansoprazole (PREVPAC) combo pack Take by mouth 2 (two) times daily. Follow package directions.      . [DISCONTINUED] amoxicillin-clarithromycin-lansoprazole (PREVPAC) combo pack Take by mouth 2 (two) times daily. Follow package directions.      . [DISCONTINUED] Atorvastatin Calcium (LIPITOR PO) Take 1 tablet by mouth every evening.        . [DISCONTINUED] metroNIDAZOLE (METROGEL) 0.75 % gel Apply  topically 2 (two) times daily.        No Known Allergies  ROS Review of Systems  Constitutional: Negative for fever and fatigue.  HENT: Negative for congestion and sore throat.   Eyes: Negative for visual disturbance.  Respiratory: Negative for cough and shortness of breath.   Cardiovascular: Negative for palpitations. Chest pain: chest burning and pressure postprandially.  Gastrointestinal: Positive for constipation. Negative for nausea, blood in stool and abdominal distention.  Genitourinary: Negative for dysuria.  Musculoskeletal: Negative for back pain and joint swelling.  Skin: Negative for rash.  Neurological: Negative for weakness and headaches.  Hematological: Negative for adenopathy.    PE; Blood pressure 108/73, pulse 74, temperature 98.6 F (37 C), temperature source Oral, resp. rate 16, height 5\' 8"  (1.727 m), weight 136 lb (61.689 kg), last menstrual period 07/30/2012, SpO2 97.00%. Gen: Alert, well appearing.  Patient is oriented to person, place, time, and situation. ENT:  Eyes: no injection, icteris, swelling, or exudate.  EOMI, PERRLA. Nose: no drainage or turbinate edema/swelling.  No injection or focal lesion.  Mouth: lips without lesion/swelling.  Oral mucosa pink and moist.  Dentition intact and without obvious caries or gingival swelling.  Oropharynx without erythema, exudate, or swelling.  Neck - No masses or thyromegaly or limitation in range of motion CV: RRR, no m/r/g.   LUNGS: CTA bilat, nonlabored resps, good aeration in all lung fields. ABD: soft, NT, ND, BS normal.  No hepatospenomegaly or mass.  No bruits.  Pertinent labs:  None today  ASSESSMENT AND PLAN:   New pt: obtain old records.  Gastritis With GERD. Gave GERD diet handout. Gave #25 samples of dexilant 60mg , take 1 qd.  Therapeutic expectations and side effect profile of medication discussed today.  Patient's questions answered. Will arrange H. Pylori--urea breath test.   An After  Visit Summary was printed and given to the patient.  Return if symptoms worsen or fail to improve.

## 2012-08-09 NOTE — Assessment & Plan Note (Signed)
With GERD. Gave GERD diet handout. Gave #25 samples of dexilant 60mg , take 1 qd.  Therapeutic expectations and side effect profile of medication discussed today.  Patient's questions answered. Will arrange H. Pylori--urea breath test.

## 2012-08-10 ENCOUNTER — Telehealth: Payer: Self-pay | Admitting: Family Medicine

## 2012-08-10 ENCOUNTER — Other Ambulatory Visit: Payer: Self-pay | Admitting: Family Medicine

## 2012-08-10 DIAGNOSIS — R768 Other specified abnormal immunological findings in serum: Secondary | ICD-10-CM

## 2012-08-10 DIAGNOSIS — K2970 Gastritis, unspecified, without bleeding: Secondary | ICD-10-CM

## 2012-08-10 DIAGNOSIS — K219 Gastro-esophageal reflux disease without esophagitis: Secondary | ICD-10-CM

## 2012-08-10 NOTE — Telephone Encounter (Signed)
PATIENT NOTIFIED OF NEED TO CONTINUE MEDICATION DR. Milinda Cave PRESCRIBED. WILL BE NOTIFIED BY [facility] OF DATE AND TIME TO BE REFERRED TO GASTROENTEROLOGY FOR FOLLOW UP OF H-PYLORIC PROBLEM. PATIENT UNDERSTANDS THIS AND NO QUESTIONS VOICED.

## 2012-08-10 NOTE — Telephone Encounter (Signed)
I spoke with Dr. Erick Blinks at [facility] and asked questions about H pylori testing for dx and also testing for eradication. He recommended the patient be referred for endoscopy and bx for H. Pylori OR he recommended we just assume that their has been insufficient eradication and try a different antibiotic regimen.  For confirmation of eradication they use the h pylori stool antigen test ([facility]) but pt must be off PPI for 10-14 days to get this test.  Since pt has been so symptomatic for 50yr and had significant GI issues with the prevpac, I think the next best step is to KEEP HER ON THE PPI that I just started on her, and refer her to [facility] for further e/m (endoscopy).    Fannie Knee: Please call pt and notify her of the plan and make sure she's okay with this before I place the referral order.-thx

## 2012-08-13 ENCOUNTER — Ambulatory Visit: Payer: PRIVATE HEALTH INSURANCE | Admitting: Internal Medicine

## 2012-08-16 ENCOUNTER — Other Ambulatory Visit: Payer: Self-pay | Admitting: Family Medicine

## 2012-08-21 ENCOUNTER — Encounter: Payer: Self-pay | Admitting: Internal Medicine

## 2012-08-30 ENCOUNTER — Encounter: Payer: Self-pay | Admitting: Internal Medicine

## 2012-08-31 ENCOUNTER — Ambulatory Visit (INDEPENDENT_AMBULATORY_CARE_PROVIDER_SITE_OTHER): Payer: PRIVATE HEALTH INSURANCE | Admitting: Internal Medicine

## 2012-08-31 ENCOUNTER — Encounter: Payer: Self-pay | Admitting: Internal Medicine

## 2012-08-31 VITALS — BP 100/60 | HR 68 | Ht 62.75 in | Wt 135.2 lb

## 2012-08-31 DIAGNOSIS — K2990 Gastroduodenitis, unspecified, without bleeding: Secondary | ICD-10-CM

## 2012-08-31 DIAGNOSIS — R1013 Epigastric pain: Secondary | ICD-10-CM

## 2012-08-31 DIAGNOSIS — Z8619 Personal history of other infectious and parasitic diseases: Secondary | ICD-10-CM

## 2012-08-31 DIAGNOSIS — K5900 Constipation, unspecified: Secondary | ICD-10-CM

## 2012-08-31 DIAGNOSIS — K2970 Gastritis, unspecified, without bleeding: Secondary | ICD-10-CM

## 2012-08-31 MED ORDER — LINACLOTIDE 145 MCG PO CAPS: 145.0000 ug | ORAL_CAPSULE | Freq: Every day | ORAL | Status: DC

## 2012-08-31 NOTE — Progress Notes (Signed)
Patient ID: Meagan [name], female   DOB: 1960-11-10, 52 y.o.   MRN: [id number]  SUBJECTIVE: HPI [name] is a 52 year old female with a past medical history of endometriosis, iron deficiency, H. pylori infection, and GERD who is seen in consultation at the request of Dr. Milinda Cave for evaluation of epigastric abdominal pain and history of H. pylori. The patient reports being diagnosed with H. pylori by serology in December and she completed 14 days of therapy with PrevPak.  This seemed to help her symptoms initially, but some of her epigastric abdominal pain and nausea returned. Her PPI was switched to Dexilant but she has remained on Dexilant 60 mg daily. With this she still continues to have intermittent breakthrough heartburn. She also has noted some ongoing abdominal bloating. She is able to eat, and denies dysphagia or odynophagia. She does report a long-standing history of constipation. She has tried MiraLAX on a daily basis without benefit. She was switched to senna plus Colace 2 tablets at night and is also found this to be ineffective. Despite senna S. she still has bowel movements only every 2-3 days. She also notes her stools to be hard. She does have a history of hemorrhoids and has seen occasional red blood on the toilet tissue only. She's had no diarrhea or other change in bowel habits. She denies lower abdominal pain. No family history of colorectal cancer. She does have a family history in a second-degree relative of stomach cancer  Urease breath testing to confirm eradication was ordered, but this test was delayed given that her primary care do not want her to hold PPI for 14 days due to symptoms  Review of Systems  As per history of present illness, otherwise negative   Past Medical History  Diagnosis Date  . SAB (spontaneous abortion)   . Hyperlipidemia   . Endometrial polyp   . Endometriosis   . History of iron deficiency anemia   . Acid indigestion   . Arthritis of hand   .  Fibroids   . Helicobacter pylori (H. pylori)     positive serology 07/2012  . Anemia   . Sleep apnea   . Rosacea     Current Outpatient Prescriptions  Medication Sig Dispense Refill  . dexlansoprazole (DEXILANT) 60 MG capsule Take 1 capsule (60 mg total) by mouth daily.  25 capsule  0  . metroNIDAZOLE (METROGEL) 0.75 % gel Apply topically 2 (two) times daily.      Marland Kitchen atorvastatin (LIPITOR) 20 MG tablet Take 20 mg by mouth daily.      . Linaclotide (LINZESS) 145 MCG CAPS Take 1 capsule (145 mcg total) by mouth daily.  30 capsule  6    No Known Allergies  Family History  Problem Relation Age of Onset  . Prostate cancer Father   . Hypertension Father   . Anuerysm Father   . Heart disease Father   . Uterine cancer Maternal Grandmother   . Heart disease Mother   . Heart disease Maternal Aunt   . Heart disease Maternal Uncle   . Heart disease Paternal Uncle   . Stomach cancer Other   . Diabetes Other     half sister and brother  . Fibroids Sister     uterine    History  Substance Use Topics  . Smoking status: Never Smoker   . Smokeless tobacco: Never Used  . Alcohol Use: Yes     Comment: OCCASIONAL    OBJECTIVE: BP 100/60  Pulse 68  Ht 5' 2.75" (1.594 m)  Wt 135 lb 4 oz (61.349 kg)  BMI 24.15 kg/m2  LMP 08/20/2012 Constitutional: Well-developed and well-nourished. No distress. HEENT: Normocephalic and atraumatic. Oropharynx is clear and moist. No oropharyngeal exudate. Conjunctivae are normal. No scleral icterus. Cardiovascular: Normal rate, regular rhythm and intact distal pulses. No M/R/G Pulmonary/chest: Effort normal and breath sounds normal. No wheezing, rales or rhonchi. Abdominal: Soft, nontender, nondistended. Bowel sounds active throughout. There are no masses palpable. No hepatosplenomegaly. Extremities: no clubbing, cyanosis, or edema Lymphadenopathy: No cervical adenopathy noted. Neurological: Alert and oriented to person place and time. Skin: Skin is  warm and dry. No rashes noted. Psychiatric: Normal mood and affect. Behavior is normal.  Labs and Imaging -- CBC    Component Value Date/Time   WBC 6.6 08/25/2011 0627   RBC 3.99 08/25/2011 0627   HGB 10.8* 08/25/2011 0627   HCT 32.9* 08/25/2011 0627   PLT 247 08/25/2011 0627   MCV 82.5 08/25/2011 0627   MCH 27.1 08/25/2011 0627   MCHC 32.8 08/25/2011 0627   RDW 15.7* 08/25/2011 0627   ASSESSMENT AND PLAN: 52 year old female with a past medical history of endometriosis, iron deficiency, H. pylori infection, and GERD who is seen in consultation at the request of Dr. Milinda Cave for evaluation of epigastric abdominal pain and history of H. Pylori.  1.  History of H. Pylori/GERD/epigastric -- given her ongoing symptoms that are not fully suppressed by daily PPI, I recommended upper endoscopy. We discussed the test today including the risks and benefits and she is agreeable to proceed. This will allow [place] to biopsy the stomach and exclude H. Pylori/confirm eradication. Now she will continue with Dexilant 60 mg daily.  2.  Constipation -- her constipation is chronic and long-standing. She has failed a trial of MiraLAX and Senokot plus docusate.  I will give her a trial of LINZESS 145 mcg daily. She is instructed to take this 30 minutes before her first meal of the day. We discussed the main side effects which is diarrhea and if this occurs she is asked to let us know. She voices understanding. I recommended colonoscopy for her in December of this year as she will be turning 52 years old.

## 2012-08-31 NOTE — Patient Instructions (Addendum)
You have been scheduled for an endoscopy with propofol. Please follow written instructions given to you at your visit today. If you use inhalers (even only as needed) or a CPAP machine, please bring them with you on the day of your procedure.  Continue taking Dexilant  Stop taking Senna  We have sent the following medications to your pharmacy for you to pick up at your convenience: Linzess 

## 2012-09-03 ENCOUNTER — Encounter: Payer: Self-pay | Admitting: Internal Medicine

## 2012-09-04 ENCOUNTER — Telehealth: Payer: Self-pay | Admitting: Internal Medicine

## 2012-09-04 NOTE — Telephone Encounter (Signed)
She should contact her primary care for recommendations regarding statin therapy and muscle aching

## 2012-09-04 NOTE — Telephone Encounter (Signed)
Informed pt that it should be OK to take both meds at the same time: 30 minutes prior to breakfast. She then asked about the Lipitor because it is making her muscles sore. Informed pt she should ask Dr Milinda Cave about that, but she said he didn't prescribe it, a nurse did. She states Dr Milinda Cave said to ask you. I cannot find a lab in [facility] for her cholesterol; please advise. Thanks.

## 2012-09-05 NOTE — Telephone Encounter (Signed)
Informed pt we do not manage cholesterol meds and she should contact her PCP and perhaps, he could change her meds. Pt stated understanding.

## 2012-09-07 ENCOUNTER — Telehealth: Payer: Self-pay | Admitting: Family Medicine

## 2012-09-07 NOTE — Telephone Encounter (Signed)
Notified pt.  She is agreeable with taking cholesterol medication, but would like something other than Lipitor.  Lipitor is causing some muscle pain.  She would like lowest dose possible and something with minimal side effects. Please advise.

## 2012-09-07 NOTE — Telephone Encounter (Signed)
Pls notify pt that her labs done 08/30/12 show her "bad cholesterol" is still too high and her lipitor needs to be increased to 40mg  once daily.  All other blood tests were fine.  If she's agreeable, pls send eRx for atorvastatin 40mg  once daily, #30, RF x 2.  Needs fasting cholesterol panel recheck in 2-3 months.-thx

## 2012-09-24 MED ORDER — PRAVASTATIN SODIUM 40 MG PO TABS: 40.0000 mg | ORAL_TABLET | Freq: Every day | ORAL | Status: DC

## 2012-09-24 NOTE — Telephone Encounter (Signed)
OK.  D/c lipitor.  We can try pravastatin.  I'll do rx.  Needs recheck of fasting cholesterol panel in 2 months.-thx

## 2012-09-24 NOTE — Telephone Encounter (Signed)
c 

## 2012-09-26 NOTE — Telephone Encounter (Signed)
I have attempted to contact this patient by phone with the following results: left message to return my call on answering machine (home).  

## 2012-09-29 ENCOUNTER — Other Ambulatory Visit: Payer: Self-pay

## 2012-10-01 ENCOUNTER — Ambulatory Visit (AMBULATORY_SURGERY_CENTER): Payer: PRIVATE HEALTH INSURANCE | Admitting: Internal Medicine

## 2012-10-01 ENCOUNTER — Encounter: Payer: Self-pay | Admitting: Internal Medicine

## 2012-10-01 VITALS — BP 117/61 | HR 59 | Temp 96.8°F | Resp 0 | Ht 62.75 in | Wt 135.0 lb

## 2012-10-01 DIAGNOSIS — K2270 Barrett's esophagus without dysplasia: Secondary | ICD-10-CM

## 2012-10-01 DIAGNOSIS — A048 Other specified bacterial intestinal infections: Secondary | ICD-10-CM

## 2012-10-01 DIAGNOSIS — R1013 Epigastric pain: Secondary | ICD-10-CM

## 2012-10-01 DIAGNOSIS — K2990 Gastroduodenitis, unspecified, without bleeding: Secondary | ICD-10-CM

## 2012-10-01 DIAGNOSIS — K2970 Gastritis, unspecified, without bleeding: Secondary | ICD-10-CM

## 2012-10-01 MED ORDER — SODIUM CHLORIDE 0.9 % IV SOLN: 500.0000 mL | INTRAVENOUS | Status: DC

## 2012-10-01 NOTE — Op Note (Signed)
[facility] [address] [place], [address]   ENDOSCOPY PROCEDURE REPORT  PATIENT: Meagan [name], Meagan [name]  MR#: [id number] BIRTHDATE: 25-Jul-1961 , 51  yrs. old GENDER: Female ENDOSCOPIST: Beverley Fiedler, MD REFERRED BY:  Earley Favor PROCEDURE DATE:  10/01/2012 PROCEDURE:  EGD w/ biopsy ASA CLASS:     Class II INDICATIONS:  Epigastric pain.   history of esophageal reflux. history of H. pylori MEDICATIONS: MAC sedation, administered by CRNA, Propofol (Diprivan), Fentanyl-Quick Pick, and Propofol (Diprivan) 320 mg IV  TOPICAL ANESTHETIC: Cetacaine Spray  DESCRIPTION OF PROCEDURE: After the risks benefits and alternatives of the procedure were thoroughly explained, informed consent was obtained.  The LB GIF-H180 [id number] endoscope was introduced through the mouth and advanced to the second portion of the duodenum. Without limitations.  The instrument was slowly withdrawn as the mucosa was fully examined.    ESOPHAGUS: A 3 cm hiatal hernia was noted (35-38 cm from the incisors).   There was evidence of suspected Barrett's esophagus 33 cm from the incisors (2 cm segment)  Multiple biopsies were performed using cold forceps.  if confirmed, Prague criteria [phone]) Sample sent for histology.  STOMACH: The mucosa of the stomach appeared normal.  Biopsies were taken in the antrum and angularis given history of H. pylori.  DUODENUM: The duodenal mucosa showed no abnormalities in the bulb and second portion of the duodenum. Retroflexed views revealed a hiatal hernia.     The scope was then withdrawn from the patient and the procedure completed.  COMPLICATIONS: There were no complications. ENDOSCOPIC IMPRESSION: 1.   3 cm hiatal hernia 2.   There was evidence of possible Barrett's esophagus (short segment); multiple biopsies 3.   The mucosa of the stomach appeared normal; biopsies were taken in the antrum and angularis 4.   The duodenal mucosa showed no abnormalities in  the bulb and second portion of the duodenum  RECOMMENDATIONS: 1.  Await biopsy results 2.  Follow-up of helicobacter pylori status, treat if indicated 3.  Continue taking your PPI (Dexilant) once daily.  It is best to be taken 20-30 minutes prior to breakfast meal.  eSigned:  Beverley Fiedler, MD 10/01/2012 10:30 AM NW:GNFAOZH McGowen, MD and The Patient

## 2012-10-01 NOTE — Patient Instructions (Addendum)
YOU HAD AN ENDOSCOPIC PROCEDURE TODAY AT THE [facility]: Refer to the procedure report that was given to you for any specific questions about what was found during the examination.  If the procedure report does not answer your questions, please call your gastroenterologist to clarify.  If you requested that your care partner not be given the details of your procedure findings, then the procedure report has been included in a sealed envelope for you to review at your convenience later.  YOU SHOULD EXPECT: Some feelings of bloating in the abdomen. Passage of more gas than usual.  Walking can help get rid of the air that was put into your GI tract during the procedure and reduce the bloating. If you had a lower endoscopy (such as a colonoscopy or flexible sigmoidoscopy) you may notice spotting of blood in your stool or on the toilet paper. If you underwent a bowel prep for your procedure, then you may not have a normal bowel movement for a few days.  DIET: Your first meal following the procedure should be a light meal and then it is ok to progress to your normal diet.  A half-sandwich or bowl of soup is an example of a good first meal.  Heavy or fried foods are harder to digest and may make you feel nauseous or bloated.  Likewise meals heavy in dairy and vegetables can cause extra gas to form and this can also increase the bloating.  Drink plenty of fluids but you should avoid alcoholic beverages for 24 hours.  ACTIVITY: Your care partner should take you home directly after the procedure.  You should plan to take it easy, moving slowly for the rest of the day.  You can resume normal activity the day after the procedure however you should NOT DRIVE or use heavy machinery for 24 hours (because of the sedation medicines used during the test).    SYMPTOMS TO REPORT IMMEDIATELY: A gastroenterologist can be reached at any hour.  During normal business hours, 8:30 AM to 5:00 PM Monday through Friday,  call [phone].  After hours and on weekends, please call the GI answering service at [phone] who will take a message and have the physician on call contact you.    Following upper endoscopy (EGD)  Vomiting of blood or coffee ground material  New chest pain or pain under the shoulder blades  Painful or persistently difficult swallowing  New shortness of breath  Fever of 100F or higher  Black, tarry-looking stools  FOLLOW UP: If any biopsies were taken you will be contacted by phone or by letter within the next 1-3 weeks.  Call your gastroenterologist if you have not heard about the biopsies in 3 weeks.  Our staff will call the home number listed on your records the next business day following your procedure to check on you and address any questions or concerns that you may have at that time regarding the information given to you following your procedure. This is a courtesy call and so if there is no answer at the home number and we have not heard from you through the emergency physician on call, we will assume that you have returned to your regular daily activities without incident.  SIGNATURES/CONFIDENTIALITY: You and/or your care partner have signed paperwork which will be entered into your electronic medical record.  These signatures attest to the fact that that the information above on your After Visit Summary has been reviewed and is understood.  Full   responsibility of the confidentiality of this discharge information lies with you and/or your care-partner.  Resume medications. Information given on hiatal hernia with discharge instructions.

## 2012-10-01 NOTE — Progress Notes (Addendum)
NO EGG OR SOY ALLERGY. EGGS DO GIVE PT HEART BURN. EWM  NO PROBLEMS WITH SEDATION WITH PAST PROCEDURES. EWM

## 2012-10-01 NOTE — Progress Notes (Signed)
Patient did not experience any of the following events: a burn prior to discharge; a fall within the facility; wrong site/side/patient/procedure/implant event; or a hospital transfer or hospital admission upon discharge from the facility. (G8907) Patient did not have preoperative order for IV antibiotic SSI prophylaxis. (G8918)  

## 2012-10-02 ENCOUNTER — Telehealth: Payer: Self-pay | Admitting: *Deleted

## 2012-10-02 NOTE — Telephone Encounter (Signed)
No answer message left for the patient. 

## 2012-10-05 ENCOUNTER — Encounter: Payer: Self-pay | Admitting: Family Medicine

## 2012-10-05 ENCOUNTER — Encounter: Payer: Self-pay | Admitting: Internal Medicine

## 2012-10-05 DIAGNOSIS — K2270 Barrett's esophagus without dysplasia: Secondary | ICD-10-CM | POA: Insufficient documentation

## 2012-10-08 ENCOUNTER — Encounter: Payer: Self-pay | Admitting: Family Medicine

## 2012-10-11 ENCOUNTER — Telehealth: Payer: Self-pay | Admitting: *Deleted

## 2012-10-11 MED ORDER — ONDANSETRON HCL 4 MG PO TABS: 4.0000 mg | ORAL_TABLET | Freq: Three times a day (TID) | ORAL | Status: DC | PRN

## 2012-10-11 NOTE — Telephone Encounter (Signed)
With diagnosis of Barrett's esophagus, current recommendations support acid suppression with PPI on a daily basis indefinitely She may benefit from a daily probiotic such as align, which could help with abdominal bloating and bowel regularity I am fine with simethicone as needed for bloating Zofran 4 mg every 8 hours when necessary for nausea is acceptable

## 2012-10-11 NOTE — Telephone Encounter (Signed)
lmom for pt to call back

## 2012-10-11 NOTE — Telephone Encounter (Signed)
Informed pt of Dr Lauro Franklin orders recommendations. I'm not sure if she understood the OTC meds. [facility] will [facility] it to her Zofran order and help find the other meds. She reports she stopped taking the Linzess d/t no stool. Wrote that she could increase to 2 pills daily if needed.

## 2012-10-11 NOTE — Telephone Encounter (Signed)
Pt called requesting results of her path report. Informed pt she has Barrett's Esophagus and that we are mailing her a letter. She still c/o nausea and bloating; she is taking Dexilant and reports the bloating is somewhat improved. Pt wants to know if she will be on Dexilant forever, can she have something for nausea? I recommended Gas X for bloating and she wants to hear it from the doctor. I am sending her a copy of her path, and pamphlets on GAS, GAS DIET, and Barrett's. Thanks.

## 2012-10-14 ENCOUNTER — Other Ambulatory Visit: Payer: Self-pay | Admitting: Internal Medicine

## 2012-10-17 ENCOUNTER — Telehealth: Payer: Self-pay | Admitting: Internal Medicine

## 2012-10-17 MED ORDER — DEXLANSOPRAZOLE 60 MG PO CPDR: 60.0000 mg | DELAYED_RELEASE_CAPSULE | Freq: Every day | ORAL | Status: DC

## 2012-10-17 NOTE — Telephone Encounter (Signed)
Sent in Rx for dexilant to [facility]

## 2012-10-18 ENCOUNTER — Telehealth: Payer: Self-pay | Admitting: Gastroenterology

## 2012-10-18 NOTE — Telephone Encounter (Signed)
Pt called in this morning complaining of not being able to keep her Dexiant and her breakfast down.  She is unsure if she should keep taking it since she cannot keep it down.  She is very hard to understand and when I ask her what she is eating, if the Zofran is helping her, if this is happening every morning? She doesn't seem to understand what I am asking.  Per Dr. Rhea Belton, he thinks she needs to come in and be seen by an extender tomorrow if possible. I left a vm for pt to call me back so I can schedule her.

## 2012-10-30 ENCOUNTER — Telehealth: Payer: Self-pay | Admitting: *Deleted

## 2012-10-30 DIAGNOSIS — E785 Hyperlipidemia, unspecified: Secondary | ICD-10-CM

## 2012-10-30 NOTE — Telephone Encounter (Signed)
Patient states that her cholesterol medication [pravastatin, change 02.10.14 from Lipitor d/t muscle pain] was suppose to be 20 mg but when she went to pharmacy the Rx was for 40 mg daily; pt states this is "too high dosage" for her and request to be changes to 20 mg daily/SLS Please advise.

## 2012-11-01 NOTE — Telephone Encounter (Signed)
Patient calls this morning regarding Pravastatin 40 mg.  States that it is her understanding that she was to take 20 mg/day - feels like 40 mg is too strong. RN discussed if it was the patient's understanding that the dosage was to be 20 mg and the tablet is scored she can divide to take the 20 rather than 40 mg dosage.  RN reassured patient that her request regarding this was in the process of being reviewed by her provider as noted in the 10/30/12 note pending.  On deeper review, RN notes that order was changed to a different drug but that the ordered dosage was 40 mg.  She need clarification as to what she is to take 20 mg or 40 mg.  If a dosage change is given, she wants this called in to her drug store.

## 2012-11-01 NOTE — Telephone Encounter (Signed)
Please advise 

## 2012-11-01 NOTE — Telephone Encounter (Signed)
I changed her to pravastatin 40mg  1 qd on 09/07/12 after she had myalgias on lipitor. I chose the starting dose of 40mg  qd based on the fact that it is a much less potent drug than lipitor. If she has been taking the whole 40mg  tab and has no muscle complaints then I would like her to continue 1 whole tab daily AND come in for FLP (lab visit only) at her earliest convenience. If she feels any muscle side effects, then she can take 1/2 tab daily and see if this helps diminish these side effects. Let me know-thx

## 2012-11-02 NOTE — Telephone Encounter (Signed)
A month is fine.-thx

## 2012-11-02 NOTE — Telephone Encounter (Signed)
Patient informed, understood & agreed/SLS  Pt has not been taking the medication; informed her that provider would like her to repeat cholesterol panel  Lab after taking the medication for a month; lab order placed/SLS Please advise if you want pt to wait longer before lab.

## 2013-01-25 ENCOUNTER — Telehealth: Payer: Self-pay | Admitting: Internal Medicine

## 2013-01-28 MED ORDER — DEXLANSOPRAZOLE 60 MG PO CPDR: 60.0000 mg | DELAYED_RELEASE_CAPSULE | Freq: Every day | ORAL | Status: DC

## 2013-01-28 NOTE — Telephone Encounter (Signed)
[facility] [address]

## 2013-02-01 ENCOUNTER — Telehealth: Payer: Self-pay | Admitting: Internal Medicine

## 2013-02-01 MED ORDER — DEXLANSOPRAZOLE 60 MG PO CPDR: 60.0000 mg | DELAYED_RELEASE_CAPSULE | Freq: Every day | ORAL | Status: DC

## 2013-02-01 NOTE — Telephone Encounter (Signed)
Spoke to pt. Told her Rx is ready to be picked up; confirmed with [facility].

## 2013-03-21 ENCOUNTER — Encounter (HOSPITAL_COMMUNITY): Payer: Self-pay | Admitting: Pharmacist

## 2013-03-22 ENCOUNTER — Encounter: Payer: Self-pay | Admitting: Internal Medicine

## 2013-03-25 ENCOUNTER — Encounter (HOSPITAL_COMMUNITY)
Admission: RE | Admit: 2013-03-25 | Discharge: 2013-03-25 | Disposition: A | Discharge status: Home / Self Care | Payer: PRIVATE HEALTH INSURANCE | Source: Ambulatory Visit | Attending: Obstetrics and Gynecology | Admitting: Obstetrics and Gynecology

## 2013-03-25 ENCOUNTER — Encounter (HOSPITAL_COMMUNITY): Payer: Self-pay

## 2013-03-25 DIAGNOSIS — Z01818 Encounter for other preprocedural examination: Secondary | ICD-10-CM | POA: Insufficient documentation

## 2013-03-25 DIAGNOSIS — Z01812 Encounter for preprocedural laboratory examination: Secondary | ICD-10-CM | POA: Insufficient documentation

## 2013-03-25 HISTORY — DX: Shortness of breath: R06.02

## 2013-03-25 HISTORY — DX: Personal history of other diseases of the digestive system: Z87.19

## 2013-03-25 LAB — CBC
HCT: 27.6 % — ABNORMAL LOW (ref 36.0–46.0)
Hemoglobin: 8.7 g/dL — ABNORMAL LOW (ref 12.0–15.0)
MCH: 22.8 pg — ABNORMAL LOW (ref 26.0–34.0)
MCHC: 31.5 g/dL (ref 30.0–36.0)
RBC: 3.82 MIL/uL — ABNORMAL LOW (ref 3.87–5.11)

## 2013-03-25 NOTE — Patient Instructions (Addendum)
Your procedure is scheduled on:03/29/13  Enter through the Main Entrance at :6am Pick up desk phone and dial [phone] and inform us of your arrival.  Please call [phone] if you have any problems the morning of surgery.  Remember: Do not eat food or drink liquids, including water, after midnight:Thursday   You may brush your teeth the morning of surgery.  Take these meds the morning of surgery with a sip of water:Dexilant  DO NOT wear jewelry, eye make-up, lipstick,body lotion, or dark fingernail polish.  (Polished toes are ok) You may wear deodorant.  If you are to be admitted after surgery, leave suitcase in car until your room has been assigned. Patients discharged on the day of surgery will not be allowed to drive home. Wear loose fitting, comfortable clothes for your ride home.

## 2013-03-26 ENCOUNTER — Ambulatory Visit: Payer: PRIVATE HEALTH INSURANCE | Admitting: Internal Medicine

## 2013-03-28 ENCOUNTER — Encounter (HOSPITAL_COMMUNITY): Payer: Self-pay | Admitting: Anesthesiology

## 2013-03-28 NOTE — Anesthesia Preprocedure Evaluation (Addendum)
Anesthesia Evaluation  Patient identified by MRN, date of birth, ID band Patient awake    Reviewed: Allergy & Precautions, H&P , NPO status , Patient's Chart, lab work & pertinent test results  Airway Mallampati: II TM Distance: >3 FB Neck ROM: Full    Dental no notable dental hx. (+) Teeth Intact   Pulmonary shortness of breath and with exertion, sleep apnea , Recent URI , Residual Cough,  breath sounds clear to auscultation  Pulmonary exam normal       Cardiovascular negative cardio ROS  Rhythm:Regular Rate:Normal     Neuro/Psych negative neurological ROS  negative psych ROS   GI/Hepatic Neg liver ROS, hiatal hernia, GERD-  Medicated and Controlled,Hx/o Barrett's Esophagus   Endo/Other  negative endocrine ROSHyperlipidemia  Renal/GU negative Renal ROS  negative genitourinary   Musculoskeletal  (+) Arthritis -, Osteoarthritis,    Abdominal   Peds  Hematology  (+) Blood dyscrasia, anemia ,   Anesthesia Other Findings   Reproductive/Obstetrics Endometrial Polyp                         Anesthesia Physical Anesthesia Plan  ASA: II  Anesthesia Plan: General   Post-op Pain Management:    Induction: Intravenous  Airway Management Planned: LMA  Additional Equipment:   Intra-op Plan:   Post-operative Plan: Extubation in OR  Informed Consent: I have reviewed the patients History and Physical, chart, labs and discussed the procedure including the risks, benefits and alternatives for the proposed anesthesia with the patient or authorized representative who has indicated his/her understanding and acceptance.   Dental advisory given  Plan Discussed with: Anesthesiologist, CRNA and Surgeon  Anesthesia Plan Comments: (Would use LMA Supreme)        Anesthesia Quick Evaluation

## 2013-03-28 NOTE — H&P (Addendum)
52 yo with irregular heavy menses and recurrent em polyp presents for surgical mngt.  Pt declines hysterectomy & ablation b/c of desire to mntn fertility  PMHx:  Neg PSHx:  Hysteroscopy All:  None Meds:  MTV SHx:  Negative  Af, vss Gen - NAD ABd - soft, NT CV - RRR Lungs - clear PV - uterus enlarged, mobile, NT.  No adnexal masses  [place] - 19mm endometrial mass, multiple submucus fibroids  A/P:  Menorrhagia, endometrial mass, desires fertility Hysteroscopy, D&C R/b/a reviewed, questions answered.  Informed constent

## 2013-03-29 ENCOUNTER — Ambulatory Visit (HOSPITAL_COMMUNITY): Payer: PRIVATE HEALTH INSURANCE | Admitting: Anesthesiology

## 2013-03-29 ENCOUNTER — Encounter (HOSPITAL_COMMUNITY): Payer: Self-pay | Admitting: Anesthesiology

## 2013-03-29 ENCOUNTER — Ambulatory Visit (HOSPITAL_COMMUNITY)
Admission: RE | Admit: 2013-03-29 | Discharge: 2013-03-29 | Disposition: A | Discharge status: Home / Self Care | Payer: PRIVATE HEALTH INSURANCE | Source: Ambulatory Visit | Attending: Obstetrics and Gynecology | Admitting: Obstetrics and Gynecology

## 2013-03-29 ENCOUNTER — Encounter (HOSPITAL_COMMUNITY)
Admission: RE | Disposition: A | Discharge status: Home / Self Care | Payer: Self-pay | Source: Ambulatory Visit | Attending: Obstetrics and Gynecology

## 2013-03-29 DIAGNOSIS — K219 Gastro-esophageal reflux disease without esophagitis: Secondary | ICD-10-CM | POA: Insufficient documentation

## 2013-03-29 DIAGNOSIS — N9489 Other specified conditions associated with female genital organs and menstrual cycle: Secondary | ICD-10-CM | POA: Insufficient documentation

## 2013-03-29 DIAGNOSIS — D250 Submucous leiomyoma of uterus: Secondary | ICD-10-CM | POA: Insufficient documentation

## 2013-03-29 DIAGNOSIS — G4730 Sleep apnea, unspecified: Secondary | ICD-10-CM | POA: Insufficient documentation

## 2013-03-29 DIAGNOSIS — D649 Anemia, unspecified: Secondary | ICD-10-CM | POA: Insufficient documentation

## 2013-03-29 DIAGNOSIS — N840 Polyp of corpus uteri: Secondary | ICD-10-CM | POA: Insufficient documentation

## 2013-03-29 DIAGNOSIS — M1990 Unspecified osteoarthritis, unspecified site: Secondary | ICD-10-CM | POA: Insufficient documentation

## 2013-03-29 DIAGNOSIS — N920 Excessive and frequent menstruation with regular cycle: Secondary | ICD-10-CM | POA: Insufficient documentation

## 2013-03-29 HISTORY — PX: DILATATION & CURETTAGE/HYSTEROSCOPY WITH TRUECLEAR: SHX6353

## 2013-03-29 SURGERY — DILATATION & CURETTAGE/HYSTEROSCOPY WITH TRUCLEAR
Anesthesia: General | Site: Vagina | Wound class: Clean Contaminated

## 2013-03-29 MED ORDER — FENTANYL CITRATE 0.05 MG/ML IJ SOLN
INTRAMUSCULAR | Status: AC
  Filled 2013-03-29: qty 2

## 2013-03-29 MED ORDER — GLYCOPYRROLATE 0.2 MG/ML IJ SOLN
INTRAMUSCULAR | Status: AC
  Filled 2013-03-29: qty 1

## 2013-03-29 MED ORDER — FENTANYL CITRATE 0.05 MG/ML IJ SOLN
INTRAMUSCULAR | Status: AC
  Administered 2013-03-29: 25 ug via INTRAVENOUS
  Filled 2013-03-29: qty 2

## 2013-03-29 MED ORDER — HYDROCODONE-IBUPROFEN 7.5-200 MG PO TABS: 1.0000 | ORAL_TABLET | Freq: Three times a day (TID) | ORAL | Status: DC | PRN

## 2013-03-29 MED ORDER — MIDAZOLAM HCL 5 MG/5ML IJ SOLN
INTRAMUSCULAR | Status: DC | PRN
  Administered 2013-03-29: 2 mg via INTRAVENOUS

## 2013-03-29 MED ORDER — SODIUM CHLORIDE 0.9 % IR SOLN
Status: DC | PRN
  Administered 2013-03-29: 3000 mL

## 2013-03-29 MED ORDER — METOCLOPRAMIDE HCL 5 MG/ML IJ SOLN: 10.0000 mg | Freq: Once | INTRAMUSCULAR | Status: DC | PRN

## 2013-03-29 MED ORDER — PROPOFOL 10 MG/ML IV BOLUS
INTRAVENOUS | Status: DC | PRN
  Administered 2013-03-29: 200 mg via INTRAVENOUS

## 2013-03-29 MED ORDER — FENTANYL CITRATE 0.05 MG/ML IJ SOLN
INTRAMUSCULAR | Status: DC | PRN
  Administered 2013-03-29: 100 ug via INTRAVENOUS

## 2013-03-29 MED ORDER — KETOROLAC TROMETHAMINE 30 MG/ML IJ SOLN
INTRAMUSCULAR | Status: DC | PRN
  Administered 2013-03-29: 30 mg via INTRAVENOUS

## 2013-03-29 MED ORDER — MEPERIDINE HCL 25 MG/ML IJ SOLN: 6.2500 mg | INTRAMUSCULAR | Status: DC | PRN

## 2013-03-29 MED ORDER — LIDOCAINE HCL (CARDIAC) 20 MG/ML IV SOLN
INTRAVENOUS | Status: AC
  Filled 2013-03-29: qty 5

## 2013-03-29 MED ORDER — DEXAMETHASONE SODIUM PHOSPHATE 4 MG/ML IJ SOLN
INTRAMUSCULAR | Status: DC | PRN
  Administered 2013-03-29: 10 mg via INTRAVENOUS

## 2013-03-29 MED ORDER — ONDANSETRON HCL 4 MG/2ML IJ SOLN
INTRAMUSCULAR | Status: AC
  Filled 2013-03-29: qty 2

## 2013-03-29 MED ORDER — AZITHROMYCIN 250 MG PO TABS: ORAL_TABLET | ORAL | Status: DC

## 2013-03-29 MED ORDER — PROPOFOL 10 MG/ML IV EMUL
INTRAVENOUS | Status: AC
  Filled 2013-03-29: qty 20

## 2013-03-29 MED ORDER — ONDANSETRON HCL 4 MG/2ML IJ SOLN
INTRAMUSCULAR | Status: DC | PRN
  Administered 2013-03-29: 4 mg via INTRAVENOUS

## 2013-03-29 MED ORDER — GLYCOPYRROLATE 0.2 MG/ML IJ SOLN
INTRAMUSCULAR | Status: DC | PRN
  Administered 2013-03-29: 0.1 mg via INTRAVENOUS

## 2013-03-29 MED ORDER — LACTATED RINGERS IV SOLN
INTRAVENOUS | Status: DC
  Administered 2013-03-29: 08:00:00 via INTRAVENOUS
  Administered 2013-03-29: 125 mL/h via INTRAVENOUS

## 2013-03-29 MED ORDER — LIDOCAINE HCL (CARDIAC) 20 MG/ML IV SOLN
INTRAVENOUS | Status: DC | PRN
  Administered 2013-03-29: 60 mg via INTRAVENOUS

## 2013-03-29 MED ORDER — FENTANYL CITRATE 0.05 MG/ML IJ SOLN: 25.0000 ug | INTRAMUSCULAR | Status: DC | PRN

## 2013-03-29 MED ORDER — DEXAMETHASONE SODIUM PHOSPHATE 10 MG/ML IJ SOLN
INTRAMUSCULAR | Status: AC
  Filled 2013-03-29: qty 1

## 2013-03-29 MED ORDER — MIDAZOLAM HCL 2 MG/2ML IJ SOLN
INTRAMUSCULAR | Status: AC
  Filled 2013-03-29: qty 2

## 2013-03-29 MED ORDER — LIDOCAINE HCL 1 % IJ SOLN
INTRAMUSCULAR | Status: DC | PRN
  Administered 2013-03-29: 10 mL

## 2013-03-29 MED ORDER — EPHEDRINE SULFATE 50 MG/ML IJ SOLN
INTRAMUSCULAR | Status: DC | PRN
  Administered 2013-03-29: 5 mg via INTRAVENOUS

## 2013-03-29 MED ORDER — KETOROLAC TROMETHAMINE 30 MG/ML IJ SOLN
INTRAMUSCULAR | Status: AC
  Filled 2013-03-29: qty 1

## 2013-03-29 MED ORDER — FERRALET 90 90-1 MG PO TABS: 1.0000 | ORAL_TABLET | ORAL | Status: DC

## 2013-03-29 SURGICAL SUPPLY — 23 items
BLADE INCISOR TRUC PLUS 2.9 (ABLATOR) IMPLANT
CANISTERS HI-FLOW 3000CC (CANNISTER) IMPLANT
CATH ROBINSON RED A/P 16FR (CATHETERS) ×2 IMPLANT
CLOTH BEACON ORANGE TIMEOUT ST (SAFETY) ×2 IMPLANT
CONTAINER PREFILL 10% NBF 60ML (FORM) ×4 IMPLANT
DRAPE HYSTEROSCOPY (DRAPE) ×2 IMPLANT
DRESSING TELFA 8X3 (GAUZE/BANDAGES/DRESSINGS) ×2 IMPLANT
ELECT REM PT RETURN 9FT ADLT (ELECTROSURGICAL) ×2
ELECTRODE REM PT RTRN 9FT ADLT (ELECTROSURGICAL) ×1 IMPLANT
GLOVE BIO SURGEON STRL SZ 6.5 (GLOVE) ×2 IMPLANT
GLOVE BIOGEL PI IND STRL 7.0 (GLOVE) ×1 IMPLANT
GLOVE BIOGEL PI INDICATOR 7.0 (GLOVE) ×1
GOWN STRL REIN XL XLG (GOWN DISPOSABLE) ×4 IMPLANT
INCISOR TRUC PLUS BLADE 2.9 (ABLATOR) ×2
KIT HYSTEROSCOPY TRUCLEAR (ABLATOR) IMPLANT
MORCELLATOR RECIP TRUCLEAR 4.0 (ABLATOR) IMPLANT
NDL SPNL 22GX3.5 QUINCKE BK (NEEDLE) ×1 IMPLANT
NEEDLE SPNL 22GX3.5 QUINCKE BK (NEEDLE) ×2 IMPLANT
PACK VAGINAL MINOR WOMEN LF (CUSTOM PROCEDURE TRAY) ×2 IMPLANT
PAD OB MATERNITY 4.3X12.25 (PERSONAL CARE ITEMS) ×2 IMPLANT
SYR CONTROL 10ML LL (SYRINGE) ×2 IMPLANT
TOWEL OR 17X24 6PK STRL BLUE (TOWEL DISPOSABLE) ×4 IMPLANT
WATER STERILE IRR 1000ML POUR (IV SOLUTION) ×2 IMPLANT

## 2013-03-29 NOTE — Anesthesia Postprocedure Evaluation (Signed)
  Anesthesia Post-op Note  Anesthesia Post Note  Patient: Meagan [name]  Procedure(s) Performed: Procedure(s) (LRB): DILATATION & CURETTAGE/HYSTEROSCOPY WITH TRUECLEAR (N/A)  Anesthesia type: General  Patient location: PACU  Post pain: Pain level controlled  Post assessment: Post-op Vital signs reviewed  Last Vitals:  Filed Vitals:   03/29/13 0930  BP:   Pulse: 73  Temp: 36.3 C  Resp: 16    Post vital signs: Reviewed  Level of consciousness: sedated  Complications: No apparent anesthesia complications

## 2013-03-29 NOTE — Transfer of Care (Signed)
Immediate Anesthesia Transfer of Care Note  Patient: Meagan [name]  Procedure(s) Performed: Procedure(s): DILATATION & CURETTAGE/HYSTEROSCOPY WITH TRUECLEAR (N/A)  Patient Location: PACU  Anesthesia Type:General  Level of Consciousness: awake, alert  and oriented  Airway & Oxygen Therapy: Patient Spontanous Breathing and Patient connected to nasal cannula oxygen  Post-op Assessment: Report given to PACU RN and Post -op Vital signs reviewed and stable  Post vital signs: Reviewed and stable  Complications: No apparent anesthesia complications

## 2013-03-29 NOTE — Anesthesia Procedure Notes (Signed)
Procedure Name: Intubation Date/Time: 03/29/2013 7:37 AM Performed by: Graciela Husbands Pre-anesthesia Checklist: Suction available, Emergency Drugs available, Timeout performed, Patient being monitored and Patient identified Patient Re-evaluated:Patient Re-evaluated prior to inductionOxygen Delivery Method: Circle system utilized Preoxygenation: Pre-oxygenation with 100% oxygen Intubation Type: IV induction Ventilation: Mask ventilation without difficulty LMA: LMA inserted LMA Size: 4.0 Number of attempts: 1 Placement Confirmation: breath sounds checked- equal and bilateral and positive ETCO2 Tube secured with: Tape Dental Injury: Teeth and Oropharynx as per pre-operative assessment

## 2013-03-29 NOTE — Op Note (Signed)
NAMEARIJANA, Meagan [name]  MEDICAL RECORD NO.:  [id number]  LOCATION:  [facility]                          FACILITY:  [facility]  PHYSICIAN:  Zelphia Cairo, MD    DATE OF BIRTH:  [birth year]  DATE OF PROCEDURE:  03/29/2013 DATE OF DISCHARGE:                              OPERATIVE REPORT   PREOPERATIVE DIAGNOSES: 1. Menorrhagia. 2. Irregular vaginal bleeding. 3. Recurrent endometrial polyp.  POSTOPERATIVE DIAGNOSES: 1. Menorrhagia. 2. Irregular vaginal bleeding. 3. Recurrent endometrial polyp, pathology pending.  PROCEDURES: 1. Cervical block. 2. Hysteroscopy. 3. Resection of endometrial polyp using Truclear.  SURGEON:  Zelphia Cairo, MD  ANESTHESIA:  General.  COMPLICATIONS:  None.  SPECIMEN:  Endometrial polyp.  FLUID DEFICIT:  270 mL.  DESCRIPTION OF PROCEDURE:  The patient was taken to the operating room. After informed consent was obtained, she was given general anesthesia, placed in a dorsal lithotomy position using Allen stirrups.  She was prepped and draped in sterile fashion and in-and-out catheter was used to drain her bladder.  Bivalve speculum was placed in the vagina and 1 mL of 1% lidocaine was injected into the cervix at 12 o'clock.  Single- tooth tenaculum was attached to the anterior lip of the cervix and the remaining lidocaine was used to perform a cervical block.  The cervix was then serially dilated using Pratt dilators.  Hysteroscope was inserted.  Flat blood clot was noted within the endometrium.  This was removed using the Truclear device.  A few areas of fluffy polypoid type endometrium were then resected to the base. Truclear and hysteroscope were then removed.  The tenaculum was removed. The cervix was hemostatic.  Speculum was removed.  The patient was taken to the recovery room in stable condition.     Zelphia Cairo, MD     GA/MEDQ  D:  03/29/2013  T:  03/29/2013  Job:  [id number]

## 2013-04-01 ENCOUNTER — Encounter (HOSPITAL_COMMUNITY): Payer: Self-pay | Admitting: Obstetrics and Gynecology

## 2013-05-08 ENCOUNTER — Encounter: Payer: Self-pay | Admitting: Internal Medicine

## 2013-05-17 ENCOUNTER — Encounter: Payer: Self-pay | Admitting: Internal Medicine

## 2013-06-20 ENCOUNTER — Other Ambulatory Visit: Payer: Self-pay

## 2013-07-30 ENCOUNTER — Ambulatory Visit (AMBULATORY_SURGERY_CENTER): Payer: Self-pay

## 2013-07-30 VITALS — Ht 63.0 in | Wt 130.4 lb

## 2013-07-30 DIAGNOSIS — Z1211 Encounter for screening for malignant neoplasm of colon: Secondary | ICD-10-CM

## 2013-07-30 MED ORDER — MOVIPREP 100 G PO SOLR: ORAL | Status: DC

## 2013-07-31 ENCOUNTER — Telehealth: Payer: Self-pay | Admitting: *Deleted

## 2013-07-31 NOTE — Telephone Encounter (Signed)
This would require overbooking of the [facility] with procedures running into lunch schedule This would have to be cleared by Aurea Graff and Dorene Grebe, you can check with them but do not confirm with patient until this is discussed further

## 2013-07-31 NOTE — Telephone Encounter (Signed)
Pt has a COLON on 12.30.14 and has a recall EGD for 09/15/13. Pt states her insurance will not pay for the one next year; can she have them both done on 08/13/13? Thanks.  You do not have space that day.

## 2013-08-01 NOTE — Telephone Encounter (Signed)
Informed pt the EGD was added to her COLON on 08/13/13; pt stated understanding.

## 2013-08-01 NOTE — Telephone Encounter (Signed)
OK'd by Aurea Graff as well to add extra procedure. lmom for pt to call back.

## 2013-08-01 NOTE — Telephone Encounter (Signed)
lmom for Meagan [name] to call back; Meagan [name] said OK.

## 2013-08-06 ENCOUNTER — Encounter: Payer: Self-pay | Admitting: Internal Medicine

## 2013-08-13 ENCOUNTER — Encounter: Payer: Self-pay | Admitting: Internal Medicine

## 2013-08-13 ENCOUNTER — Ambulatory Visit (AMBULATORY_SURGERY_CENTER): Payer: PRIVATE HEALTH INSURANCE | Admitting: Internal Medicine

## 2013-08-13 VITALS — BP 92/55 | HR 71 | Temp 96.9°F | Resp 11 | Ht 63.0 in | Wt 130.0 lb

## 2013-08-13 DIAGNOSIS — Z1211 Encounter for screening for malignant neoplasm of colon: Secondary | ICD-10-CM

## 2013-08-13 DIAGNOSIS — K2970 Gastritis, unspecified, without bleeding: Secondary | ICD-10-CM

## 2013-08-13 DIAGNOSIS — A048 Other specified bacterial intestinal infections: Secondary | ICD-10-CM

## 2013-08-13 DIAGNOSIS — K2990 Gastroduodenitis, unspecified, without bleeding: Secondary | ICD-10-CM

## 2013-08-13 DIAGNOSIS — K2270 Barrett's esophagus without dysplasia: Secondary | ICD-10-CM

## 2013-08-13 MED ORDER — PANTOPRAZOLE SODIUM 40 MG PO TBEC: 40.0000 mg | DELAYED_RELEASE_TABLET | Freq: Every day | ORAL | Status: DC

## 2013-08-13 MED ORDER — SODIUM CHLORIDE 0.9 % IV SOLN: 500.0000 mL | INTRAVENOUS | Status: DC

## 2013-08-13 NOTE — Patient Instructions (Signed)
YOU HAD AN ENDOSCOPIC PROCEDURE TODAY AT THE [facility]: Refer to the procedure report that was given to you for any specific questions about what was found during the examination.  If the procedure report does not answer your questions, please call your gastroenterologist to clarify.  If you requested that your care partner not be given the details of your procedure findings, then the procedure report has been included in a sealed envelope for you to review at your convenience later.  YOU SHOULD EXPECT: Some feelings of bloating in the abdomen. Passage of more gas than usual.  Walking can help get rid of the air that was put into your GI tract during the procedure and reduce the bloating. If you had a lower endoscopy (such as a colonoscopy or flexible sigmoidoscopy) you may notice spotting of blood in your stool or on the toilet paper. If you underwent a bowel prep for your procedure, then you may not have a normal bowel movement for a few days.  DIET: Your first meal following the procedure should be a light meal and then it is ok to progress to your normal diet.  A half-sandwich or bowl of soup is an example of a good first meal.  Heavy or fried foods are harder to digest and may make you feel nauseous or bloated.  Likewise meals heavy in dairy and vegetables can cause extra gas to form and this can also increase the bloating.  Drink plenty of fluids but you should avoid alcoholic beverages for 24 hours.  ACTIVITY: Your care partner should take you home directly after the procedure.  You should plan to take it easy, moving slowly for the rest of the day.  You can resume normal activity the day after the procedure however you should NOT DRIVE or use heavy machinery for 24 hours (because of the sedation medicines used during the test).    SYMPTOMS TO REPORT IMMEDIATELY: A gastroenterologist can be reached at any hour.  During normal business hours, 8:30 AM to 5:00 PM Monday through Friday,  call [phone].  After hours and on weekends, please call the GI answering service at [phone] who will take a message and have the physician on call contact you.   Following lower endoscopy (colonoscopy or flexible sigmoidoscopy):  Excessive amounts of blood in the stool  Significant tenderness or worsening of abdominal pains  Swelling of the abdomen that is new, acute  Fever of 100F or higher  Following upper endoscopy (EGD)  Vomiting of blood or coffee ground material  New chest pain or pain under the shoulder blades  Painful or persistently difficult swallowing  New shortness of breath  Fever of 100F or higher  Black, tarry-looking stools  FOLLOW UP: If any biopsies were taken you will be contacted by phone or by letter within the next 1-3 weeks.  Call your gastroenterologist if you have not heard about the biopsies in 3 weeks.  Our staff will call the home number listed on your records the next business day following your procedure to check on you and address any questions or concerns that you may have at that time regarding the information given to you following your procedure. This is a courtesy call and so if there is no answer at the home number and we have not heard from you through the emergency physician on call, we will assume that you have returned to your regular daily activities without incident.  SIGNATURES/CONFIDENTIALITY: You and/or your care   partner have signed paperwork which will be entered into your electronic medical record.  These signatures attest to the fact that that the information above on your After Visit Summary has been reviewed and is understood.  Full responsibility of the confidentiality of this discharge information lies with you and/or your care-partner.  Recommendations See procedure report 

## 2013-08-13 NOTE — Progress Notes (Signed)
Report to pacu rn, vss, bbs=clear 

## 2013-08-13 NOTE — Op Note (Signed)
[facility] [address] [place], [address]   COLONOSCOPY PROCEDURE REPORT  PATIENT: Meagan [name], Meagan [name]  MR#: [id number] BIRTHDATE: [birth year] , 52  yrs. old GENDER: Female ENDOSCOPIST: Beverley Fiedler, MD PROCEDURE DATE:  08/13/2013 PROCEDURE:   Colonoscopy, screening First Screening Colonoscopy - Avg.  risk and is 50 yrs.  old or older Yes.  Prior Negative Screening - Now for repeat screening. N/A  History of Adenoma - Now for follow-up colonoscopy & has been > or = to 3 yrs.  N/A  Polyps Removed Today? No.  Recommend repeat exam, <10 yrs? No. ASA CLASS:   Class II INDICATIONS:average risk screening and first colonoscopy. MEDICATIONS: MAC sedation, administered by CRNA and propofol (Diprivan) 200mg  IV  DESCRIPTION OF PROCEDURE:   After the risks benefits and alternatives of the procedure were thoroughly explained, informed consent was obtained.  A digital rectal exam revealed external hemorrhoids.   The LB [id number]  endoscope was introduced through the anus and advanced to the cecum, which was identified by both the appendix and ileocecal valve. No adverse events experienced.   The quality of the prep was fair clearing to good, using MoviPrep.  The instrument was then slowly withdrawn as the colon was fully examined.    COLON FINDINGS: A normal appearing cecum, ileocecal valve, and appendiceal orifice (imaging not captured) were identified.  The ascending, hepatic flexure, transverse, splenic flexure, descending, sigmoid colon and rectum appeared unremarkable.  No polyps or cancers were seen.  Retroflexed views revealed external hemorrhoids. The time to cecum=13 minutes 37 seconds.  Withdrawal time=11 minutes 33 seconds.  The scope was withdrawn and the procedure completed.  COMPLICATIONS: There were no complications.  ENDOSCOPIC IMPRESSION: Normal colon  RECOMMENDATIONS: You should continue to follow colorectal cancer screening guidelines for  "routine risk" patients with a repeat colonoscopy in 10 years. There is no need for FOBT (stool) testing for at least 5 years.   eSigned:  Beverley Fiedler, MD 08/13/2013 9:25 AM   cc: Earley Favor, MD and The Patient

## 2013-08-13 NOTE — Op Note (Signed)
[facility] [address] [place], [address]   ENDOSCOPY PROCEDURE REPORT  PATIENT: Meagan, [name]  MR#: [id number] BIRTHDATE: [birth year] , 52  yrs. old GENDER: Female ENDOSCOPIST: Beverley Fiedler, MD PROCEDURE DATE:  08/13/2013 PROCEDURE:  EGD w/ biopsy ASA CLASS:     Class II INDICATIONS:  follow up of Barrett's esophagus. MEDICATIONS: MAC sedation, administered by CRNA and propofol (Diprivan) 300mg  IV TOPICAL ANESTHETIC: none  DESCRIPTION OF PROCEDURE: After the risks benefits and alternatives of the procedure were thoroughly explained, informed consent was obtained.  The LB [id number] endoscope was introduced through the mouth and advanced to the second portion of the duodenum. Without limitations.  The instrument was slowly withdrawn as the mucosa was fully examined.    ESOPHAGUS: There was evidence of Barrett's esophagus 35 cm from the incisors extending to top of gastric folds at 38 cm (3 cm segment). Multiple biopsies were performed using cold forceps (4 quad biopsy at 35/36 cm and 38 cm).  Sample sent for histology.  STOMACH: The mucosa of the stomach appeared normal.  DUODENUM: The duodenal mucosa showed no abnormalities in the bulb and second portion of the duodenum.  Retroflexed views revealed no abnormalities.     The scope was then withdrawn from the patient and the procedure completed.  COMPLICATIONS: There were no complications.  ENDOSCOPIC IMPRESSION: 1.   There was evidence of Barrett's esophagus; multiple biopsies 2.   The mucosa of the stomach appeared normal 3.   The duodenal mucosa showed no abnormalities in the bulb and second portion of the duodenum  RECOMMENDATIONS: 1.  Await pathology results 2.  Resume PPI (will change to pantoprazole 40 mg given expense of Dexilant) once daily.  It is best to be taken 20-30 minutes prior to breakfast meal.  REPEAT EXAM: In 3 year for EGD pending biopsy results (assuming  no dysplasia).  eSigned:  Beverley Fiedler, MD 08/13/2013 9:22 AM          CC:The Patient and Earley Favor, MD

## 2013-08-13 NOTE — Progress Notes (Signed)
Called to room to assist during endoscopic procedure.  Patient ID and intended procedure confirmed with present staff. Received instructions for my participation in the procedure from the performing physician.  

## 2013-08-14 ENCOUNTER — Telehealth: Payer: Self-pay | Admitting: *Deleted

## 2013-08-14 NOTE — Telephone Encounter (Signed)
LM on VM that identifies pt by first and last name to return call if problems questions or concerns. ewm

## 2013-08-22 ENCOUNTER — Telehealth: Payer: Self-pay | Admitting: *Deleted

## 2013-08-22 ENCOUNTER — Encounter: Payer: Self-pay | Admitting: Internal Medicine

## 2013-08-22 NOTE — Telephone Encounter (Signed)
Informed pt of path results and the need for AB for her + H. Pylori and her Barrett's is still present she she will be in for a Recall in 3 years. Pt with hx of H. Pylori and was treated with AB prior to her 1st visit with [place] on 08/31/12. 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 Pylori. Informed pt I don't think that's correct, but I will ask Dr Hilarie Fredrickson. Her 1st path on 09/2012 was negative for H. Pylori, but the current one from 08/13/13 was positive. I mailed her copies of these path reports and I asked her to ask the company nurse what AB she used to treat her. Pt will call me in the am.

## 2013-08-22 NOTE — Telephone Encounter (Signed)
Message copied by Lance Morin on Thu Aug 22, 2013  3:36 PM ------      Message from: Jerene Bears      Created: Thu Aug 22, 2013  1:15 PM       Bx shows H pylori -- please treat with Pylera      Also Barrett's who dysplasia (as before), repeat EGD in 3 years ------

## 2013-08-23 NOTE — Telephone Encounter (Signed)
Pt called back to report she took PrevPak for her 1st episode of H. Pylori. Informed her I will send this to Dr Hilarie Fredrickson. She also reports nausea in the am since her ECL on 08/03/13. While she is eating she feels more bloated and the swelling moves up into her chest and she feels as though she is suffocating. She states she must stand to eat to prevent these problems. Type of food does not matter and she tries to eat smaller meals and that has no effect. I asked how her bowels are and her routine is not the same as before the bowel prep. Asked if she has taken Miralax and she stated it does not work for her. She has a script for Linzess which she stated worked, but she can't afford it. OK to give her samples of Linzess and please advise on both notes? Thanks.

## 2013-08-26 MED ORDER — BIS SUBCIT-METRONID-TETRACYC 140-125-125 MG PO CAPS: 3.0000 | ORAL_CAPSULE | Freq: Three times a day (TID) | ORAL | Status: DC

## 2013-08-26 MED ORDER — DEXLANSOPRAZOLE 60 MG PO CPDR: 60.0000 mg | DELAYED_RELEASE_CAPSULE | Freq: Every day | ORAL | Status: DC

## 2013-08-26 NOTE — Telephone Encounter (Signed)
It is possible that she has recurrent H. pylori and given that she was treated with Prevpac initially, I would treat now with Pylera plus twice daily PPI (once daily Dexilant should be okay) Bowel habits can take some time to return to normal after bowel preparation Would complete antibiotics first and then we can address any bowel irregularity

## 2013-08-26 NOTE — Telephone Encounter (Signed)
Informed pt of new therapy of Pylera and that she needs to take Dexilant daily while on the Pylera; I have left her 10 capsules, enough to complete the therapy. I have ordered Pylera and hopefully her insurance will pay good.  Also informed her we will see about her bowel problem after the AB tx. Pt stated understanding and I left explicit instructions in the samples on how to take the [place].

## 2013-11-18 ENCOUNTER — Telehealth: Payer: Self-pay | Admitting: Internal Medicine

## 2013-11-18 DIAGNOSIS — K2270 Barrett's esophagus without dysplasia: Secondary | ICD-10-CM

## 2013-11-18 MED ORDER — PANTOPRAZOLE SODIUM 40 MG PO TBEC: 40.0000 mg | DELAYED_RELEASE_TABLET | Freq: Every day | ORAL | Status: DC

## 2013-11-18 MED ORDER — LUBIPROSTONE 24 MCG PO CAPS: 24.0000 ug | ORAL_CAPSULE | Freq: Two times a day (BID) | ORAL | Status: DC

## 2013-11-18 NOTE — Telephone Encounter (Signed)
Patient notified

## 2013-11-18 NOTE — Telephone Encounter (Signed)
Patient reports stomach bloating and constipation.  She is not taking linzess due to cost, she is taking pantoprazole and requests a 90 day rx.  She wants an alternative to linzess.  She declines an appt to discuss.  Please advise

## 2013-11-18 NOTE — Telephone Encounter (Signed)
Does she have Linzess copay card? If so, then can try Amitiza 24 mcg BID Okay for 90 day PPI with 3 refills

## 2014-03-09 ENCOUNTER — Other Ambulatory Visit: Payer: Self-pay | Admitting: Internal Medicine

## 2014-03-15 ENCOUNTER — Other Ambulatory Visit: Payer: Self-pay | Admitting: Internal Medicine

## 2014-04-14 ENCOUNTER — Other Ambulatory Visit: Payer: Self-pay | Admitting: Internal Medicine

## 2014-04-22 ENCOUNTER — Other Ambulatory Visit: Payer: Self-pay | Admitting: Internal Medicine

## 2014-04-23 ENCOUNTER — Other Ambulatory Visit: Payer: Self-pay | Admitting: Gynecology

## 2014-04-24 LAB — CYTOLOGY - PAP

## 2014-04-30 ENCOUNTER — Other Ambulatory Visit: Payer: Self-pay | Admitting: Gynecology

## 2014-04-30 DIAGNOSIS — R928 Other abnormal and inconclusive findings on diagnostic imaging of breast: Secondary | ICD-10-CM

## 2014-05-06 ENCOUNTER — Ambulatory Visit
Admission: RE | Admit: 2014-05-06 | Discharge: 2014-05-06 | Disposition: A | Discharge status: Home / Self Care | Payer: PRIVATE HEALTH INSURANCE | Source: Ambulatory Visit | Attending: Gynecology | Admitting: Gynecology

## 2014-05-06 DIAGNOSIS — R928 Other abnormal and inconclusive findings on diagnostic imaging of breast: Secondary | ICD-10-CM

## 2014-05-24 ENCOUNTER — Other Ambulatory Visit: Payer: Self-pay | Admitting: Internal Medicine

## 2014-06-16 ENCOUNTER — Encounter: Payer: Self-pay | Admitting: Internal Medicine

## 2014-08-15 HISTORY — PX: BREAST LUMPECTOMY: SHX2

## 2014-10-22 ENCOUNTER — Telehealth: Payer: Self-pay | Admitting: Internal Medicine

## 2014-10-22 NOTE — Telephone Encounter (Signed)
Pt states she has LLQ abdominal pain a couple of days ago, also reports she is having a lot of bloating. Pt scheduled to see Nicoletta Ba PA tomorrow at 2:30pm. Pt aware of appt.

## 2014-10-23 ENCOUNTER — Ambulatory Visit: Payer: PRIVATE HEALTH INSURANCE | Admitting: Physician Assistant

## 2014-10-31 ENCOUNTER — Telehealth: Payer: Self-pay | Admitting: Hematology

## 2014-10-31 NOTE — Telephone Encounter (Signed)
called pt to attempt to schedule new patient appt.  left msg.  TG  Dx: heavy bleeding, reluctant to consider hysterectomy- not taking iron due to side effects- needs evaluation and possible IV iron Referring: Dr. Bjorn Loser

## 2014-11-10 ENCOUNTER — Telehealth: Payer: Self-pay | Admitting: Hematology

## 2014-11-10 NOTE — Telephone Encounter (Signed)
attempted to call pt a second time to schedule new pt appt. left msg. TG

## 2014-11-14 ENCOUNTER — Telehealth: Payer: Self-pay | Admitting: Internal Medicine

## 2014-11-14 NOTE — Telephone Encounter (Signed)
Called pt and scheduled new patient appt.   Meagan [name] 12/25/14 11am

## 2014-11-20 ENCOUNTER — Other Ambulatory Visit: Payer: Self-pay | Admitting: Internal Medicine

## 2014-11-26 ENCOUNTER — Encounter: Payer: Self-pay | Admitting: *Deleted

## 2014-12-22 ENCOUNTER — Ambulatory Visit: Payer: PRIVATE HEALTH INSURANCE | Admitting: Internal Medicine

## 2014-12-25 ENCOUNTER — Other Ambulatory Visit: Payer: PRIVATE HEALTH INSURANCE

## 2014-12-25 ENCOUNTER — Ambulatory Visit: Payer: PRIVATE HEALTH INSURANCE

## 2014-12-25 ENCOUNTER — Ambulatory Visit: Payer: PRIVATE HEALTH INSURANCE | Admitting: Internal Medicine

## 2014-12-25 ENCOUNTER — Other Ambulatory Visit: Payer: Self-pay | Admitting: Internal Medicine

## 2014-12-25 DIAGNOSIS — D539 Nutritional anemia, unspecified: Secondary | ICD-10-CM | POA: Insufficient documentation

## 2015-02-24 ENCOUNTER — Other Ambulatory Visit: Payer: Self-pay | Admitting: Internal Medicine

## 2015-02-27 ENCOUNTER — Telehealth: Payer: Self-pay | Admitting: Internal Medicine

## 2015-02-27 MED ORDER — PANTOPRAZOLE SODIUM 40 MG PO TBEC: DELAYED_RELEASE_TABLET | ORAL | Status: DC

## 2015-02-27 NOTE — Telephone Encounter (Signed)
Patient's last visit was on 08/31/12. She has scheduled a follow up visit for September. Can we give refills until then?

## 2015-02-27 NOTE — Telephone Encounter (Signed)
Yes ok for refills until followup visit

## 2015-02-27 NOTE — Telephone Encounter (Signed)
Prescription sent to patient's pharmacy until scheduled appt and notified to keep appt for any further refills.

## 2015-03-28 ENCOUNTER — Other Ambulatory Visit: Payer: Self-pay | Admitting: Internal Medicine

## 2015-05-01 ENCOUNTER — Ambulatory Visit: Payer: PRIVATE HEALTH INSURANCE | Admitting: Internal Medicine

## 2015-05-02 ENCOUNTER — Encounter (HOSPITAL_COMMUNITY): Payer: Self-pay | Admitting: *Deleted

## 2015-05-02 ENCOUNTER — Emergency Department (HOSPITAL_COMMUNITY): Payer: Commercial Managed Care - PPO

## 2015-05-02 ENCOUNTER — Emergency Department (HOSPITAL_COMMUNITY)
Admission: EM | Admit: 2015-05-02 | Discharge: 2015-05-02 | Disposition: A | Discharge status: Home / Self Care | Payer: Commercial Managed Care - PPO | Attending: Physician Assistant | Admitting: Physician Assistant

## 2015-05-02 DIAGNOSIS — K219 Gastro-esophageal reflux disease without esophagitis: Secondary | ICD-10-CM | POA: Insufficient documentation

## 2015-05-02 DIAGNOSIS — D259 Leiomyoma of uterus, unspecified: Secondary | ICD-10-CM | POA: Diagnosis not present

## 2015-05-02 DIAGNOSIS — Z8719 Personal history of other diseases of the digestive system: Secondary | ICD-10-CM | POA: Diagnosis not present

## 2015-05-02 DIAGNOSIS — D649 Anemia, unspecified: Secondary | ICD-10-CM | POA: Diagnosis not present

## 2015-05-02 DIAGNOSIS — Z3202 Encounter for pregnancy test, result negative: Secondary | ICD-10-CM | POA: Diagnosis not present

## 2015-05-02 DIAGNOSIS — Z8669 Personal history of other diseases of the nervous system and sense organs: Secondary | ICD-10-CM | POA: Diagnosis not present

## 2015-05-02 DIAGNOSIS — Z8619 Personal history of other infectious and parasitic diseases: Secondary | ICD-10-CM | POA: Diagnosis not present

## 2015-05-02 DIAGNOSIS — Z8739 Personal history of other diseases of the musculoskeletal system and connective tissue: Secondary | ICD-10-CM | POA: Insufficient documentation

## 2015-05-02 DIAGNOSIS — Z872 Personal history of diseases of the skin and subcutaneous tissue: Secondary | ICD-10-CM | POA: Insufficient documentation

## 2015-05-02 DIAGNOSIS — E785 Hyperlipidemia, unspecified: Secondary | ICD-10-CM | POA: Diagnosis not present

## 2015-05-02 DIAGNOSIS — R1031 Right lower quadrant pain: Secondary | ICD-10-CM | POA: Diagnosis present

## 2015-05-02 DIAGNOSIS — Z79899 Other long term (current) drug therapy: Secondary | ICD-10-CM | POA: Diagnosis not present

## 2015-05-02 LAB — COMPREHENSIVE METABOLIC PANEL
ALBUMIN: 4.3 g/dL (ref 3.5–5.0)
ALK PHOS: 53 U/L (ref 38–126)
ALT: 13 U/L — AB (ref 14–54)
ANION GAP: 5 (ref 5–15)
AST: 17 U/L (ref 15–41)
BILIRUBIN TOTAL: 0.7 mg/dL (ref 0.3–1.2)
BUN: 15 mg/dL (ref 6–20)
CALCIUM: 9.9 mg/dL (ref 8.9–10.3)
CO2: 28 mmol/L (ref 22–32)
CREATININE: 0.67 mg/dL (ref 0.44–1.00)
Chloride: 105 mmol/L (ref 101–111)
GFR calc Af Amer: >60 mL/min (ref >60)
GFR calc non Af Amer: >60 mL/min (ref >60)
GLUCOSE: 92 mg/dL (ref 65–99)
Potassium: 4.1 mmol/L (ref 3.5–5.1)
Sodium: 138 mmol/L (ref 135–145)
TOTAL PROTEIN: 7.7 g/dL (ref 6.5–8.1)

## 2015-05-02 LAB — LIPASE, BLOOD: Lipase: 35 U/L (ref 22–51)

## 2015-05-02 LAB — CBC
HCT: 36.4 % (ref 36.0–46.0)
HEMOGLOBIN: 11.9 g/dL — AB (ref 12.0–15.0)
MCH: 27 pg (ref 26.0–34.0)
MCHC: 32.7 g/dL (ref 30.0–36.0)
MCV: 82.5 fL (ref 78.0–100.0)
PLATELETS: 215 10*3/uL (ref 150–400)
RBC: 4.41 MIL/uL (ref 3.87–5.11)
RDW: 15.8 % — AB (ref 11.5–15.5)
WBC: 8.3 10*3/uL (ref 4.0–10.5)

## 2015-05-02 LAB — URINALYSIS, ROUTINE W REFLEX MICROSCOPIC
BILIRUBIN URINE: NEGATIVE
GLUCOSE, UA: NEGATIVE mg/dL
HGB URINE DIPSTICK: NEGATIVE
KETONES UR: NEGATIVE mg/dL
Leukocytes, UA: NEGATIVE
Nitrite: NEGATIVE
PROTEIN: NEGATIVE mg/dL
Specific Gravity, Urine: 1.03 (ref 1.005–1.030)
UROBILINOGEN UA: 0.2 mg/dL (ref 0.0–1.0)
pH: 5.5 (ref 5.0–8.0)

## 2015-05-02 LAB — POC URINE PREG, ED: PREG TEST UR: NEGATIVE

## 2015-05-02 MED ORDER — ONDANSETRON HCL 4 MG/2ML IJ SOLN
4.0000 mg | Freq: Once | INTRAMUSCULAR | Status: AC
  Administered 2015-05-02: 4 mg via INTRAVENOUS
  Filled 2015-05-02: qty 2

## 2015-05-02 MED ORDER — MORPHINE SULFATE (PF) 4 MG/ML IV SOLN
4.0000 mg | Freq: Once | INTRAVENOUS | Status: AC
  Administered 2015-05-02: 4 mg via INTRAVENOUS
  Filled 2015-05-02: qty 1

## 2015-05-02 MED ORDER — IOHEXOL 300 MG/ML  SOLN
25.0000 mL | Freq: Once | INTRAMUSCULAR | Status: DC | PRN
  Administered 2015-05-02: 25 mL via ORAL
  Filled 2015-05-02: qty 30

## 2015-05-02 MED ORDER — OXYCODONE-ACETAMINOPHEN 5-325 MG PO TABS: 1.0000 | ORAL_TABLET | ORAL | Status: DC | PRN

## 2015-05-02 MED ORDER — NAPROXEN 375 MG PO TABS: 375.0000 mg | ORAL_TABLET | Freq: Two times a day (BID) | ORAL | Status: DC

## 2015-05-02 MED ORDER — DOCUSATE SODIUM 100 MG PO CAPS: 100.0000 mg | ORAL_CAPSULE | Freq: Two times a day (BID) | ORAL | Status: DC

## 2015-05-02 MED ORDER — IOHEXOL 300 MG/ML  SOLN
100.0000 mL | Freq: Once | INTRAMUSCULAR | Status: AC | PRN
  Administered 2015-05-02: 100 mL via INTRAVENOUS

## 2015-05-02 MED ORDER — SODIUM CHLORIDE 0.9 % IV BOLUS (SEPSIS)
1000.0000 mL | Freq: Once | INTRAVENOUS | Status: AC
  Administered 2015-05-02: 1000 mL via INTRAVENOUS

## 2015-05-02 MED ORDER — POLYETHYLENE GLYCOL 3350 17 GM/SCOOP PO POWD: ORAL | Status: DC

## 2015-05-02 NOTE — ED Notes (Signed)
Pt sts she is having abdominal pain and distention since Tuesday, very tender on the right side, sts pain got worse after intercourse. Denies n/v, fever LBM 04/30/2015

## 2015-05-02 NOTE — ED Notes (Signed)
Pt states she has chronic constipation and takes fiber gummys laxative.  Has been bloated since Thursday but took fiber gummys and relieved bloat; however, she began having abdominal pain on the lower right side of abdomen.  She also has hx of fibroids and UTI.  Pt denies urinary symptoms of pain or burning with urination.  Denies odor or darkened color of urine.

## 2015-05-02 NOTE — ED Provider Notes (Signed)
CSN: [id number]     Arrival date & time 05/02/15  1318 History   First MD Initiated Contact with Patient 05/02/15 1640     Chief Complaint  Patient presents with  . Abdominal Pain     (Consider location/radiation/quality/duration/timing/severity/associated sxs/prior Treatment) HPI  Meagan [name] Is a 54 year old female with past medical history of chronic constipation, uterine fibroids, with enlarged uterus, presents emergency Department with chief complaint of right lower quadrant abdominal pain. Patient states that 2 days ago she began having some tenderness in her right lower quadrant. She and her husband attempted to have intercourse yesterday, but she had too much tenderness in the right unable to continue. Patient states that since that time she's had progressively worsening constant, aching pain in her right lower quadrant. She states that it is severe. She denies any flank pain, urinary symptoms or vaginal symptoms. The patient has had multiple gynecologic surgeries but denies appendectomy or cholecystectomy. She denies any vomiting. Patient made a small bowel movement yesterday, but her husband describes her bowel movements as "rabbit poop." The patient states that it hurts when she moves, coughs, walks or gets jostled in the car. She denies fevers. She states she is very uncomfortable.  Past Medical History  Diagnosis Date  . SAB (spontaneous abortion)   . Hyperlipidemia   . Endometrial polyp   . Endometriosis   . History of iron deficiency anemia   . GERD (gastroesophageal reflux disease)     +Barretts esophagus on endo 09/2012  . Arthritis of hand   . Fibroids   . Helicobacter pylori (H. pylori)     positive serology 07/2012  . Anemia   . Rosacea   . Shortness of breath     on exertion  . Sleep apnea     no CPAP  . H/O hiatal hernia   . Barrett esophagus   . H. pylori infection    Past Surgical History  Procedure Laterality Date  . Myomectomy abdominal approach    .  Polypectomy  2013    ENDOMETRIAL POLYP  . Wisdom tooth extraction    . Hysteroscopy  2010  . Exp. lap. left ovarian cystectomy / lysis adhesions/ myomectomy  2008  . D & c hysteroscopy/ polypectomy/ dx laparoscopy/ lysis adhesions/ ablation endometriosis  04-10-2009  . Hysteroscopy w/d&c  08/25/2011    Procedure: DILATATION AND CURETTAGE /HYSTEROSCOPY;  Surgeon: Marylynn Pearson;  Location: [facility];  Service: Gynecology;;  . Myomectomy    . Esophagogastroduodenoscopy  10/01/12    chronic inactive gastritis (H. pylori neg, no dysplasia or malig),+ Barretts esophagus (Dr. Hilarie Fredrickson)  . Dilatation & curettage/hysteroscopy with trueclear N/A 03/29/2013    Procedure: DILATATION & CURETTAGE/HYSTEROSCOPY WITH TRUECLEAR;  Surgeon: Marylynn Pearson, MD;  Location: [facility];  Service: Gynecology;  Laterality: N/A;   Family History  Problem Relation Age of Onset  . Prostate cancer Father   . Hypertension Father   . Anuerysm Father   . Heart disease Father   . Uterine cancer Maternal Grandmother   . Heart disease Mother   . Heart disease Maternal Aunt   . Heart disease Maternal Uncle   . Heart disease Paternal Uncle   . Stomach cancer Other   . Diabetes Other     half sister and brother  . Fibroids Sister     uterine  . Esophageal cancer Neg Hx   . Colon cancer Neg Hx    Social History  Substance Use Topics  . Smoking status:  Never Smoker   . Smokeless tobacco: Never Used  . Alcohol Use: 0.6 - 1.2 oz/week    1-2 Glasses of wine per week     Comment: OCCASIONAL   OB History    Gravida Para Term Preterm AB TAB SAB Ectopic Multiple Living   10    10  10    0     Review of Systems   Ten systems reviewed and are negative for acute change, except as noted in the HPI.     Allergies  Review of patient's allergies indicates no known allergies.  Home Medications   Prior to Admission medications   Medication Sig Start Date End Date Taking? Authorizing Provider  FIBER  SELECT GUMMIES CHEW Chew 2 capsules by mouth daily.   Yes Historical Provider, MD  Multiple Vitamin (MULTIVITAMIN WITH MINERALS) TABS tablet Take 1 tablet by mouth daily. *Mixes with all greens and opc 3*   Yes Historical Provider, MD  pantoprazole (PROTONIX) 40 MG tablet TAKE 1 TABLET (40 MG TOTAL) BY MOUTH DAILY.*INS ALLOWS 30 DAY 02/27/15  Yes Jerene Bears, MD  Fe Cbn-Fe Gluc-FA-B12-C-DSS (FERRALET 90) 90-1 MG TABS Take 1 tablet by mouth every morning. Patient not taking: Reported on 05/02/2015 03/29/13   Marylynn Pearson, MD  lubiprostone (AMITIZA) 24 MCG capsule Take 1 capsule (24 mcg total) by mouth 2 (two) times daily with a meal. Patient not taking: Reported on 05/02/2015 11/18/13   Jerene Bears, MD  pravastatin (PRAVACHOL) 40 MG tablet Take 1 tablet (40 mg total) by mouth daily. Patient not taking: Reported on 05/02/2015 09/24/12   Tammi Sou, MD   BP 106/76 mmHg  Pulse 76  Resp 20  SpO2 100%  LMP 04/08/2015 Physical Exam  Constitutional: She is oriented to person, place, and time. She appears well-developed and well-nourished. No distress.  HENT:  Head: Normocephalic and atraumatic.  Eyes: Conjunctivae are normal. No scleral icterus.  Neck: Normal range of motion.  Cardiovascular: Normal rate, regular rhythm and normal heart sounds.  Exam reveals no gallop and no friction rub.   No murmur heard. Pulmonary/Chest: Effort normal and breath sounds normal. No respiratory distress.  Abdominal: Soft. Bowel sounds are normal. She exhibits no distension and no mass. There is tenderness. There is no guarding.  Patient exquisitely tender to palpation in the right lower quadrant. Negative obturator or so. Last time. She is positive Rovsing sign. Patient has a distended and palpable uterus up to her umbilicus.  Neurological: She is alert and oriented to person, place, and time.  Skin: Skin is warm and dry. She is not diaphoretic.  Nursing note and vitals reviewed.   ED Course  Procedures  (including critical care time) Labs Review Labs Reviewed  COMPREHENSIVE METABOLIC PANEL - Abnormal; Notable for the following:    ALT 13 (*)    All other components within normal limits  CBC - Abnormal; Notable for the following:    Hemoglobin 11.9 (*)    RDW 15.8 (*)    All other components within normal limits  LIPASE, BLOOD  URINALYSIS, ROUTINE W REFLEX MICROSCOPIC (NOT AT [facility])  POC URINE PREG, ED    Imaging Review No results found. I have personally reviewed and evaluated these images and lab results as part of my medical decision-making.   EKG Interpretation None      MDM   Final diagnoses:  Uterine leiomyoma, unspecified location    Patient with exquisite right lower quadrant tenderness. The patient also has a history  of chronic constipation. Although where she could have a right-sided diverticulitis. The patient will require a CT scanning of the abdomen. She will need a rule out of appendicitis.  Patient CT shows multiple extremely large uterine leiomyoma. The largest being 7 cm in the right fundus of the uterus. There is a central necrosis of present. I consulted with Dr.Grewal , who is the on-call physician for the patient's OB/GYN group. She states that this is very likely the cause of her pain. She does not have any emergent surgical concerns. Patient to be discharged with pain medications. She is advised to follow closely with her OB/GYN provider on Monday. Patient given prescription for naproxen and Percocet. She appears safe for discharge at this time.  Margarita Mail, PA-C 05/02/15 2321  Courteney Julio Alm, MD 05/03/15 [phone]

## 2015-05-02 NOTE — Discharge Instructions (Signed)
You appear to have a large fibroid with that is outgrowing its blood supply. This is likely the cause of your pain today. Uterine Fibroid A uterine fibroid is a growth (tumor) that occurs in your uterus. This type of tumor is not cancerous and does not spread out of the uterus. You can have one or many fibroids. Fibroids can vary in size, weight, and where they grow in the uterus. Some can become quite large. Most fibroids do not require medical treatment, but some can cause pain or heavy bleeding during and between periods. CAUSES  A fibroid is the result of a single uterine cell that keeps growing (unregulated), which is different than most cells in the human body. Most cells have a control mechanism that keeps them from reproducing without control.  SIGNS AND SYMPTOMS   Bleeding.  Pelvic pain and pressure.  Bladder problems due to the size of the fibroid.  Infertility and miscarriages depending on the size and location of the fibroid. DIAGNOSIS  Uterine fibroids are diagnosed through a physical exam. Your health care provider may feel the lumpy tumors during a pelvic exam. Ultrasonography may be done to get information regarding size, location, and number of tumors.  TREATMENT   Your health care provider may recommend watchful waiting. This involves getting the fibroid checked by your health care provider to see if it grows or shrinks.   Hormone treatment or an intrauterine device (IUD) may be prescribed.   Surgery may be needed to remove the fibroids (myomectomy) or the uterus (hysterectomy). This depends on your situation. When fibroids interfere with fertility and a woman wants to become pregnant, a health care provider may recommend having the fibroids removed.  [place] care depends on how you were treated. In general:   Keep all follow-up appointments with your health care provider.   Only take over-the-counter or prescription medicines as directed by your  health care provider. If you were prescribed a hormone treatment, take the hormone medicines exactly as directed. Do not take aspirin. It can cause bleeding.   Talk to your health care provider about taking iron pills.  If your periods are troublesome but not so heavy, lie down with your feet raised slightly above your heart. Place cold packs on your lower abdomen.   If your periods are heavy, write down the number of pads or tampons you use per month. Bring this information to your health care provider.   Include green vegetables in your diet.  SEEK IMMEDIATE MEDICAL CARE IF:  You have pelvic pain or cramps not controlled with medicines.   You have a sudden increase in pelvic pain.   You have an increase in bleeding between and during periods.   You have excessive periods and soak tampons or pads in a half hour or less.  You feel lightheaded or have fainting episodes. Document Released: 07/29/2000 Document Revised: 05/22/2013 Document Reviewed: 02/28/2013 [facility] Patient Information 2015 [place], [place]. This information is not intended to replace advice given to you by your health care provider. Make sure you discuss any questions you have with your health care provider.

## 2015-05-31 ENCOUNTER — Other Ambulatory Visit: Payer: Self-pay | Admitting: Internal Medicine

## 2015-06-03 ENCOUNTER — Other Ambulatory Visit: Payer: Self-pay | Admitting: Internal Medicine

## 2015-06-09 ENCOUNTER — Other Ambulatory Visit: Payer: Self-pay | Admitting: Obstetrics and Gynecology

## 2015-06-09 DIAGNOSIS — N6310 Unspecified lump in the right breast, unspecified quadrant: Secondary | ICD-10-CM

## 2015-06-09 DIAGNOSIS — N6320 Unspecified lump in the left breast, unspecified quadrant: Principal | ICD-10-CM

## 2015-06-11 ENCOUNTER — Ambulatory Visit
Admission: RE | Admit: 2015-06-11 | Discharge: 2015-06-11 | Disposition: A | Discharge status: Home / Self Care | Payer: Commercial Managed Care - PPO | Source: Ambulatory Visit | Attending: Obstetrics and Gynecology | Admitting: Obstetrics and Gynecology

## 2015-06-11 ENCOUNTER — Other Ambulatory Visit: Payer: Self-pay | Admitting: Obstetrics and Gynecology

## 2015-06-11 DIAGNOSIS — N6310 Unspecified lump in the right breast, unspecified quadrant: Secondary | ICD-10-CM

## 2015-06-11 DIAGNOSIS — N6320 Unspecified lump in the left breast, unspecified quadrant: Secondary | ICD-10-CM

## 2015-06-12 ENCOUNTER — Encounter: Payer: Self-pay | Admitting: *Deleted

## 2015-06-26 ENCOUNTER — Other Ambulatory Visit: Payer: Self-pay | Admitting: Obstetrics and Gynecology

## 2015-06-26 ENCOUNTER — Ambulatory Visit
Admission: RE | Admit: 2015-06-26 | Discharge: 2015-06-26 | Disposition: A | Discharge status: Home / Self Care | Payer: Commercial Managed Care - PPO | Source: Ambulatory Visit | Attending: Obstetrics and Gynecology | Admitting: Obstetrics and Gynecology

## 2015-06-26 DIAGNOSIS — C50912 Malignant neoplasm of unspecified site of left female breast: Secondary | ICD-10-CM

## 2015-06-26 DIAGNOSIS — N6320 Unspecified lump in the left breast, unspecified quadrant: Secondary | ICD-10-CM

## 2015-06-26 HISTORY — DX: Malignant neoplasm of unspecified site of left female breast: C50.912

## 2015-06-29 HISTORY — PX: BREAST BIOPSY: SHX20

## 2015-06-30 ENCOUNTER — Other Ambulatory Visit: Payer: Self-pay | Admitting: General Surgery

## 2015-06-30 ENCOUNTER — Ambulatory Visit: Payer: Self-pay | Admitting: Surgery

## 2015-06-30 ENCOUNTER — Other Ambulatory Visit: Payer: Commercial Managed Care - PPO

## 2015-06-30 DIAGNOSIS — C50912 Malignant neoplasm of unspecified site of left female breast: Secondary | ICD-10-CM

## 2015-06-30 NOTE — H&P (Signed)
Meagan [name] 06/30/2015 2:20 PM Location: [facility] Patient #: [id number] DOB: 02/09/1940 Married / Language: English / Race: White Female  History of Present Illness Meagan Moores A. Maggi Hershkowitz MD; 06/30/2015 5:00 PM) Patient words: Patient sent at the request of Dr. Ammie [name] for right breast cancer. She underwent screening mammography which showed 2 densities right breast upper inner quadrant 1:00 within a centimeter of each other. 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 Each measures approximately 1 cm. Denies any history of breast mass nipple discharge or change in the appearance of her breasts. She has no significant family history of breast cancer except for her mother in her 71s. She is sore from her biopsy.          ADDITIONAL INFORMATION: 1. PROGNOSTIC INDICATORS Results: IMMUNOHISTOCHEMICAL AND MORPHOMETRIC ANALYSIS PERFORMED MANUALLY Estrogen Receptor: 100%, POSITIVE, STRONG STAINING INTENSITY Progesterone Receptor: 10%, POSITIVE, MODERATE STAINING INTENSITY Proliferation Marker Ki67: 10% REFERENCE RANGE ESTROGEN RECEPTOR NEGATIVE 0% POSITIVE =>1% REFERENCE RANGE PROGESTERONE RECEPTOR NEGATIVE 0% POSITIVE =>1% All controls stained appropriately Meagan Cutter MD Pathologist, Electronic Signature ( Signed 06/25/2015) 1. FLUORESCENCE IN-SITU HYBRIDIZATION Results: HER2 - **POSITIVE** RATIO OF HER2/CEP17 SIGNALS 4.75 AVERAGE HER2 COPY NUMBER PER CELL 12.35 Reference Range: NEGATIVE HER2/CEP17 Ratio <2.0 and average HER2 copy number <4.0 EQUIVOCAL HER2/CEP17 Ratio <2.0 and average HER2 copy number 4.0 and <6.0 1 of 4 FINAL for Meagan [name] ([id number]) ADDITIONAL INFORMATION:(continued) POSITIVE HER2/CEP17 Ratio >=2.0 or <2.0 and average HER2 copy number >=6.0 Meagan Cutter MD Pathologist, Electronic Signature ( Signed 06/25/2015) 2. PROGNOSTIC INDICATORS Results: IMMUNOHISTOCHEMICAL AND  MORPHOMETRIC ANALYSIS PERFORMED MANUALLY Estrogen Receptor: 100%, POSITIVE, STRONG STAINING INTENSITY Progesterone Receptor: 0%, NEGATIVE Proliferation Marker Ki67: 20% COMMENT: The negative hormone receptor study(ies) in this case has no internal positive control. REFERENCE RANGE ESTROGEN RECEPTOR NEGATIVE 0% POSITIVE =>1% REFERENCE RANGE PROGESTERONE RECEPTOR NEGATIVE 0% POSITIVE =>1% All controls stained appropriately Meagan Cutter MD Pathologist, Electronic Signature ( Signed 06/25/2015) 2. FLUORESCENCE IN-SITU HYBRIDIZATION Results: HER2 - **POSITIVE** RATIO OF HER2/CEP17 SIGNALS 4.73 AVERAGE HER2 COPY NUMBER PER CELL 11.60 Reference Range: NEGATIVE HER2/CEP17 Ratio <2.0 and average HER2 copy number <4.0 EQUIVOCAL HER2/CEP17 Ratio <2.0 and average HER2 copy number 4.0 and <6.0 POSITIVE HER2/CEP17 Ratio >=2.0 or <2.0 and average HER2 copy number >=6.0 Meagan Cutter MD Pathologist, Electronic Signature ( Signed 06/25/2015) 2 of 4 FINAL for Meagan [name] ([id number]) FINAL DIAGNOSIS Diagnosis 1. Breast, right, needle core biopsy, 1:00 o'clock, 9 CMFN - INVASIVE DUCTAL CARCINOMA. - DUCTAL CARCINOMA IN SITU. - SEE COMMENT. 2. Breast, right, needle core biopsy, 1:00 o'clock, 10 CMFN - INVASIVE DUCTAL CARCINOMA. - DUCTAL CARCINOMA IN SITU WITH ASSOCIATED CALCIFICATIONS. - SEE COMMENT. Microscopic Comment 1. Although definitive grading of breast carcinoma is best done on excision, the features of the invasive tumor from the right 1 o'clock needle core biopsy 9 cm from the nipple are compatible with a grade 1 to 2 breast carcinoma. Breast prognostic markers will be performed and reported in an addendum. Findings are called to the [name] on 06/23/2015. Dr. Donato [name] has seen the first specimen in consultation with agreement. 2. Although definitive grading of breast carcinoma is best done on excision, the features of the invasive tumor from the right 1 o'clock  biopsy 10 cm from the nipple are compatible with a grade 1 to 2 breast carcinoma. Breast prognostic markers will be performed and reported in an addendum. Findings are called to the [name] on 06/23/2015.  Dr. Donato [name] has seen the second specimen in consultation with agreement. (RH:ds 06/23/15) Meagan Niece MD Pathologist, Electronic Signature (Case signed 06/23/2015) Specimen Gross and Clinical Information Specimen Comment 1. In formalin 2:30, extracted <5 mins; screening detected right breast masses 2. In formalin 2:50, extracted <5 mins Specimen(s) Obtained: 1. Breast, right, needle core biopsy, 1:00 o'clock, 9 CMFN 2. Breast, right, needle core biopsy, 1:00 o'clock, 10 CMFN Specimen Clinical Information 1. Suspect malignancy Gross 1. Received in formalin are 4 cores of soft, tan-red tissue which measure 1.4 x 0.2 x 0.2 cm and up to 1.6 x 0.2 x 0.2 cm. Submitted in toto in 1 block(s). Time in Formalin 2:30 pm (sk) 2. Received in formalin are 3 cores of soft, tan-red tissue which measure 1.4 x 0.3 x 0.2 cm and up to 2.4 x 0.3 x 0.2 cm. Submitted in toto in 1 block(s). Time in Formalin 2:50 pm (sk) Stain(s) used in Diagnosis: The following stain(s) were used in diagnosing the case: KI-67-ACIS, PR-ACIS, Her2 FISH, ER-ACIS. The control(s) stained appropriately. Disclaimer PR progesterone receptor (PgR 636), immunohistochemical stains are performed on formalin fixed, paraffin embedded tissue using a 3,3"-diaminobenzidine (DAB) chromogen and [employer] 3 of 4 FINAL for Meagan [name] ([id number]) Disclaimer(continued) System. The staining intensity of the nucleus is scored morphometrically using the Automated Cellular Imaging System (ACIS) and is reported as the percentage of tumor cell nuclei demonstrating specific nuclear staining. HER2 IQFISH pharmDX (code [phone]) is a direct fluorescence in-situ hybridization assay designed to quantitatively determine HER2 gene  amplification in formalin-fixed, paraffin-embedded tissue specimens. It is performed at [facility] and is reported using ASCO/CAP scoring criteria published in 2013. Ki-67 (Mib-1), immunohistochemical stains are performed on formalin fixed, paraffin embedded tissue using a 3,3"-diaminobenzidine (DAB) chromogen and [place]. The staining intensity of the nucleus is scored morphometrically using the Automated Cellular Imaging System (ACIS) and is reported as the percentage of tumor cell nuclei demonstrating specific nuclear staining. Estrogen receptor (SP1), immunohistochemical stains are performed on formalin fixed, paraffin embedded tissue using a 3,3"-diaminobenzidine (DAB) chromogen and [employer] Autostainer System. The staining intensity of the nucleus is scored morphometrically using the Automated Cellular Imaging System (ACIS) and is reported as the percentage of tumor cell nuclei demonstrating specific nuclear staining. Report signed out from the following location(s) Technical Component was performed at [facility]. [address],[place] [address].[id number]., Technical component and interpretation was performed at [address], [place], [place] [address]. CLIA #: [id number],      CLINICAL DATA: Patient returns after screening study for evaluation of possible right breast mass.  EXAM: DIGITAL DIAGNOSTIC RIGHT MAMMOGRAM WITH 3D TOMOSYNTHESIS WITH CAD  ULTRASOUND RIGHT BREAST  COMPARISON: 06/05/2015 and earlier  ACR Breast Density Category c: The breast tissue is heterogeneously dense, which may obscure small masses.  FINDINGS: Additional tomosynthesis images are performed, confirming presence of 2 spiculated masses in the upper central portion of the right breast.  Mammographic images were processed with CAD.  On physical exam, I palpate no abnormality in the upper  central portion of the right breast.  Targeted ultrasound is performed, showing a small hypoechoic irregular mass with hyperechoic rim in the 1 o'clock location of the right breast 10 cm from the nipple. Mass measures 0.7 x 0.5 x 0.6 cm. A second mass is identified in the 1 o'clock location 9 cm from the nipple which measures 0.7 x 0.7 x 0.6 cm. The 2 masses are 2.4 cm apart. Evaluation  of the right axilla is negative for adenopathy.  IMPRESSION: 1. Two suspicious masses in the 1 o'clock location of the right breast warranting tissue diagnosis. 2. No evidence for adenopathy.  RECOMMENDATION: Ultrasound-guided core biopsies are recommended and scheduled for the patient on 06/22/2015 at 2 o'clock p.m.  I have discussed the findings and recommendations with the patient. Results were also provided in writing at the conclusion of the.  The patient is a 54 year old female.   Other Problems Elbert Ewings, CMA; 06/30/2015 2:21 PM) Asthma Breast Cancer Gastroesophageal Reflux Disease High blood pressure Hypercholesterolemia  Past Surgical History Elbert Ewings, CMA; 06/30/2015 2:21 PM) Breast Biopsy Right. Hysterectomy (not due to cancer) - Partial  Diagnostic Studies History Elbert Ewings, CMA; 06/30/2015 2:21 PM) Colonoscopy never Mammogram within last year Pap Smear >5 years ago  Allergies Elbert Ewings, CMA; 06/30/2015 2:21 PM) Penicillin V *PENICILLINS*  Medication History Elbert Ewings, CMA; 06/30/2015 2:24 PM) ProAir HFA (108 (90 Base)MCG/ACT Aerosol Soln, Inhalation) Active. Omeprazole (40MG Capsule DR, Oral) Active. Calcium (500MG Tablet, Oral) Active. Vitamin D3 (1000UNIT Tablet, Oral) Active. Multiple Vitamin (Oral) Active. MetFORMIN HCl (500MG Tablet, Oral) Active. Losartan Potassium (25MG Tablet, Oral) Active. Montelukast Sodium (10MG Tablet, Oral) Active. Cinnamon Plus Chromium (100-500MCG-MG Capsule, Oral) Active. Garlic Oil (9937JI Capsule,  Oral) Active. Medications Reconciled  Social History Elbert Ewings, [place]; 06/30/2015 2:21 PM) Caffeine use Tea. No alcohol use No drug use Tobacco use Never smoker.  Family History Elbert Ewings, [place]; 06/30/2015 2:21 PM) Breast Cancer Mother. Hypertension Mother.  Pregnancy / Birth History Elbert Ewings, CMA; 06/30/2015 2:21 PM) Age at menarche 9 years. Gravida 0 Para 0     Review of Systems Elbert Ewings CMA; 06/30/2015 2:21 PM) General Present- Fatigue. Not Present- Appetite Loss, Chills, Fever, Night Sweats, Weight Gain and Weight Loss. Skin Not Present- Change in Wart/Mole, Dryness, Hives, Jaundice, New Lesions, Non-Healing Wounds, Rash and Ulcer. HEENT Present- Hoarseness and Wears glasses/contact lenses. Not Present- Earache, Hearing Loss, Nose Bleed, Oral Ulcers, Ringing in the Ears, Seasonal Allergies, Sinus Pain, Sore Throat, Visual Disturbances and Yellow Eyes. Respiratory Present- Chronic Cough and Snoring. Not Present- Bloody sputum, Difficulty Breathing and Wheezing. Breast Not Present- Breast Mass, Breast Pain, Nipple Discharge and Skin Changes. Cardiovascular Not Present- Chest Pain, Difficulty Breathing Lying Down, Leg Cramps, Palpitations, Rapid Heart Rate, Shortness of Breath and Swelling of Extremities. Gastrointestinal Not Present- Abdominal Pain, Bloating, Bloody Stool, Change in Bowel Habits, Chronic diarrhea, Constipation, Difficulty Swallowing, Excessive gas, Gets full quickly at meals, Hemorrhoids, Indigestion, Nausea, Rectal Pain and Vomiting. Female Genitourinary Not Present- Frequency, Nocturia, Painful Urination, Pelvic Pain and Urgency. Musculoskeletal Not Present- Back Pain, Joint Pain, Joint Stiffness, Muscle Pain, Muscle Weakness and Swelling of Extremities. Psychiatric Not Present- Anxiety, Bipolar, Change in Sleep Pattern, Depression, Fearful and Frequent crying. Endocrine Present- Hair Changes. Not Present- Cold Intolerance, Excessive Hunger,  Heat Intolerance, Hot flashes and New Diabetes. Hematology Present- Easy Bruising. Not Present- Excessive bleeding, Gland problems, HIV and Persistent Infections.  Vitals Elbert Ewings CMA; 06/30/2015 2:24 PM) 06/30/2015 2:24 PM Weight: 116.8 lb Height: 60in Body Surface Area: 1.49 m Body Mass Index: 22.81 kg/m  Temp.: 98.55F(Temporal)  Pulse: 81 (Regular)  BP: 130/64 (Sitting, Left Arm, Standard)      Physical Exam (Lamondre Wesche A. Antoninette Lerner MD; 06/30/2015 5:01 PM)  General Mental Status-Alert. General Appearance-Consistent with stated age. Hydration-Well hydrated. Voice-Normal.  Head and Neck Head-normocephalic, atraumatic with no lesions or palpable masses. Trachea-midline. Thyroid Gland Characteristics - normal size and consistency.  Chest  and Lung Exam Chest and lung exam reveals -quiet, even and easy respiratory effort with no use of accessory muscles and on auscultation, normal breath sounds, no adventitious sounds and normal vocal resonance. Inspection Chest Wall - Normal. Back - normal.  Breast Note: Bruising right breast upper inner quadrant with small hematoma. Nipple normal right breast. Left breast normal without mass lesion nipple discharge.  Cardiovascular Cardiovascular examination reveals -normal heart sounds, regular rate and rhythm with no murmurs and normal pedal pulses bilaterally.  Neurologic Neurologic evaluation reveals -alert and oriented x 3 with no impairment of recent or remote memory. Mental Status-Normal.  Musculoskeletal Normal Exam - Left-Upper Extremity Strength Normal and Lower Extremity Strength Normal. Normal Exam - Right-Upper Extremity Strength Normal and Lower Extremity Strength Normal.  Lymphatic Head & Neck  General Head & Neck Lymphatics: Bilateral - Description - Normal. Axillary  General Axillary Region: Bilateral - Description - Normal. Tenderness - Non Tender.    Assessment & Plan  (Deondrae Mcgrail A. 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 She is a good lumpectomy candidate without neoadjuvant chemotherapy but may require a Port-A-Cath for postoperative care. Discussed mastectomy and reconstruction as well. She is very interested in proceeding with right breast lumpectomy and sentinel lymph node mapping. We'll await consultant's input and then schedule surgery after.  Current Plans Referred to Oncology, for evaluation and follow up (Oncology). Routine. Referred to Radiation Oncology, for evaluation and follow up (Radiation Oncology). Routine. Referred to Physical Therapy, for evaluation and follow up (Physical Therapy). Routine. You are being scheduled for surgery - Our schedulers will call you.  You should hear from our office's scheduling department within 5 working days about the location, date, and time of surgery. We try to make accommodations for patient's preferences in scheduling surgery, but sometimes the OR schedule or the surgeon's schedule prevents [place] from making those accommodations.  If you have not heard from our office [phone]) in 5 working days, call the office and ask for your surgeon's nurse.  If you have other questions about your diagnosis, plan, or surgery, call the office and ask for your surgeon's nurse.  Pt Education - [facility] Breast Cancer Information Given - [facility] "Breast Journey" Package We discussed the staging and pathophysiology of breast cancer. We discussed all of the different options for treatment for breast cancer including surgery, chemotherapy, radiation therapy, Herceptin, and antiestrogen therapy. We discussed a sentinel lymph node biopsy as she does not appear to having lymph node involvement right now. We discussed the performance of that with injection of  radioactive tracer and blue dye. We discussed that she would have an incision underneath her axillary hairline. We discussed that there is a bout a 10-20% chance of having a positive node with a sentinel lymph node biopsy and we will await the permanent pathology to make any other first further decisions in terms of her treatment. One of these options might be to return to the operating room to perform an axillary lymph node dissection. We discussed about a 1-2% risk lifetime of chronic shoulder pain as well as lymphedema associated with a sentinel lymph node biopsy. We discussed the options for treatment of the breast cancer which included lumpectomy versus a mastectomy. We discussed the performance of the lumpectomy with a wire placement. We discussed a 10-20% chance of a positive margin requiring reexcision in the  operating room. We also discussed that she may need radiation therapy or antiestrogen therapy or both if she undergoes lumpectomy. We discussed the mastectomy and the postoperative care for that as well. We discussed that there is no difference in her survival whether she undergoes lumpectomy with radiation therapy or antiestrogen therapy versus a mastectomy. There is a slight difference in the local recurrence rate being 3-5% with lumpectomy and about 1% with a mastectomy. We discussed the risks of operation including bleeding, infection, possible reoperation. She understands her further therapy will be based on what her stages at the time of her operation.  Pt Education - [facility] breast cancer surgery: discussed with patient and provided information. Pt Education - [facility] Breast Biopsy HCI: discussed with patient and provided information. Pt Education - ABC (After Breast Cancer) Class Info: discussed with patient and provided information.

## 2015-07-01 ENCOUNTER — Encounter: Payer: Self-pay | Admitting: Internal Medicine

## 2015-07-01 ENCOUNTER — Ambulatory Visit (INDEPENDENT_AMBULATORY_CARE_PROVIDER_SITE_OTHER): Payer: Commercial Managed Care - PPO | Admitting: Internal Medicine

## 2015-07-01 VITALS — BP 100/60 | HR 72 | Ht 63.0 in | Wt 130.0 lb

## 2015-07-01 DIAGNOSIS — K219 Gastro-esophageal reflux disease without esophagitis: Secondary | ICD-10-CM | POA: Diagnosis not present

## 2015-07-01 DIAGNOSIS — K5909 Other constipation: Secondary | ICD-10-CM

## 2015-07-01 DIAGNOSIS — K2270 Barrett's esophagus without dysplasia: Secondary | ICD-10-CM

## 2015-07-01 DIAGNOSIS — K5900 Constipation, unspecified: Secondary | ICD-10-CM | POA: Diagnosis not present

## 2015-07-01 MED ORDER — PANTOPRAZOLE SODIUM 40 MG PO TBEC: DELAYED_RELEASE_TABLET | ORAL | Status: DC

## 2015-07-01 NOTE — Patient Instructions (Signed)
Please continue pantoprazole.  Discontinue Amitiza and miralax.  Please follow up with Dr Hilarie Fredrickson in 1 year.

## 2015-07-01 NOTE — Progress Notes (Signed)
   Subjective:    Patient ID: Meagan [name], female    DOB: 1961-03-31, 54 y.o.   MRN: [id number]  HPI Meagan [name] is a 54 year old female with a past medical history of H. pylori status post treatment, GERD with Barrett's esophagus, constipation who is seen for follow-up. Unfortunately she has learned that she has breast cancer of the left breast. Apparently this was missed by mammography but diagnosed by ultrasound and biopsy. She has plans for surgery and radiation therapy. Also she was seen in the ER in September 2016 with constipation and right lower quadrant abdominal pain. Abdominal CT scan was performed which ruled out appendicitis but did show multiple large fibroid tumors one of which likely was infarcted. Gynecology was consult did by phone they recommended pain management and hysterectomy. Hysterectomy was actually scheduled for December 1 but she was promoted at work and chose to delay the surgery.  From a constipation standpoint she had previously tried Amitiza and Linzess neither of which worked very well. She has put herself on figs and fiber gummies.  This has helped tremendously and she is having daily regular bowel movement. She's had improvement in abdominal bloating. Heartburn and reflux symptoms of been well controlled on 40 mg of pantoprazole daily. She denies dysphagia or odynophagia.   Review of Systems As per history of present illness, otherwise negative  Current Medications, Allergies, Past Medical History, Past Surgical History, Family History and Social History were reviewed in [employer] record.     Objective:   Physical Exam BP 100/60 mmHg  Pulse 72  Ht 5\' 3"  (1.6 m)  Wt 130 lb (58.968 kg)  BMI 23.03 kg/m2  LMP 06/16/2015 Constitutional: Well-developed and well-nourished. No distress. HEENT: Normocephalic and atraumatic. Conjunctivae are normal.  No scleral icterus. Neck: Neck supple. Trachea midline. Cardiovascular: Normal rate, regular  rhythm and intact distal pulses. No M/R/G Pulmonary/chest: Effort normal and breath sounds normal. No wheezing, rales or rhonchi. Abdominal: Soft, nontender, nondistended. Bowel sounds active throughout.  Extremities: no clubbing, cyanosis, or edema Neurological: Alert and oriented to person place and time. Psychiatric: Normal mood and affect. Behavior is normal.  CT scan abdomen and pelvis with contrast September 2016  -- personally reviewed today  Upper endoscopy 08/13/2013 -- Barrett's esophagus without dysplasia Colonoscopy December 2014 -- normal     Assessment & Plan:   54 year old female with a past medical history of H. pylori status post treatment, GERD with Barrett's esophagus, constipation who is seen for follow-up.  1. Constipation -- inadequate response to prescription laxatives. Favorable response to fiber supplementation and eating figs.  This is very natural and I encouraged her to continue this regimen going forward.  2. GERD with Barrett's esophagus -- continue pantoprazole 40 mg daily. Surveillance endoscopy recommended December 2017  3. Breast cancer -- new diagnosis, plans for surgical followed by radiation therapy  4. Uterine fibroids -- symptomatic. She will deal with her breast cancer first and then likely proceed with hysterectomy  One year follow-up, sooner if necessary

## 2015-07-06 ENCOUNTER — Telehealth: Payer: Self-pay | Admitting: *Deleted

## 2015-07-06 ENCOUNTER — Ambulatory Visit
Admission: RE | Admit: 2015-07-06 | Discharge: 2015-07-06 | Disposition: A | Discharge status: Home / Self Care | Payer: Commercial Managed Care - PPO | Source: Ambulatory Visit | Attending: Surgery | Admitting: Surgery

## 2015-07-06 ENCOUNTER — Telehealth: Payer: Self-pay | Admitting: Oncology

## 2015-07-06 MED ORDER — GADOBENATE DIMEGLUMINE 529 MG/ML IV SOLN
12.0000 mL | Freq: Once | INTRAVENOUS | Status: AC | PRN
  Administered 2015-07-06: 12 mL via INTRAVENOUS

## 2015-07-06 NOTE — Telephone Encounter (Signed)
New Breast Appt-S/w patient and gave np appt for 11/29 with an 3:45 arrival w/Dr. Jana Hakim.  Referring Dr. Erroll Luna  Referral information scanned

## 2015-07-06 NOTE — Telephone Encounter (Signed)
Mailed before appt letter, calendar, welcoming packet & intake form to pt. 

## 2015-07-14 ENCOUNTER — Ambulatory Visit (HOSPITAL_BASED_OUTPATIENT_CLINIC_OR_DEPARTMENT_OTHER): Payer: Commercial Managed Care - PPO | Admitting: Oncology

## 2015-07-14 ENCOUNTER — Other Ambulatory Visit (HOSPITAL_BASED_OUTPATIENT_CLINIC_OR_DEPARTMENT_OTHER): Payer: Commercial Managed Care - PPO

## 2015-07-14 VITALS — BP 121/76 | HR 70 | Temp 97.5°F | Resp 18 | Ht 63.0 in | Wt 130.7 lb

## 2015-07-14 DIAGNOSIS — C50412 Malignant neoplasm of upper-outer quadrant of left female breast: Secondary | ICD-10-CM

## 2015-07-14 DIAGNOSIS — Z170 Estrogen receptor positive status [ER+]: Secondary | ICD-10-CM | POA: Insufficient documentation

## 2015-07-14 DIAGNOSIS — D539 Nutritional anemia, unspecified: Secondary | ICD-10-CM | POA: Diagnosis not present

## 2015-07-14 LAB — COMPREHENSIVE METABOLIC PANEL (CC13)
ALT: 19 U/L (ref 0–55)
ANION GAP: 6 meq/L (ref 3–11)
AST: 21 U/L (ref 5–34)
Albumin: 3.8 g/dL (ref 3.5–5.0)
Alkaline Phosphatase: 55 U/L (ref 40–150)
BUN: 12.2 mg/dL (ref 7.0–26.0)
CALCIUM: 9.7 mg/dL (ref 8.4–10.4)
CHLORIDE: 104 meq/L (ref 98–109)
CO2: 28 mEq/L (ref 22–29)
CREATININE: 0.9 mg/dL (ref 0.6–1.1)
EGFR: 79 mL/min/{1.73_m2} — AB (ref >90)
Glucose: 95 mg/dl (ref 70–140)
Potassium: 4.2 mEq/L (ref 3.5–5.1)
Sodium: 138 mEq/L (ref 136–145)
Total Bilirubin: 0.58 mg/dL (ref 0.20–1.20)
Total Protein: 7.6 g/dL (ref 6.4–8.3)

## 2015-07-14 LAB — CBC & DIFF AND RETIC
BASO%: 0.4 % (ref 0.0–2.0)
Basophils Absolute: 0 10*3/uL (ref 0.0–0.1)
EOS%: 5.6 % (ref 0.0–7.0)
Eosinophils Absolute: 0.3 10*3/uL (ref 0.0–0.5)
HCT: 36.8 % (ref 34.8–46.6)
HEMOGLOBIN: 12.1 g/dL (ref 11.6–15.9)
Immature Retic Fract: 6.2 % (ref 1.60–10.00)
LYMPH%: 26.1 % (ref 14.0–49.7)
MCH: 27.6 pg (ref 25.1–34.0)
MCHC: 32.9 g/dL (ref 31.5–36.0)
MCV: 83.8 fL (ref 79.5–101.0)
MONO#: 0.4 10*3/uL (ref 0.1–0.9)
MONO%: 8.2 % (ref 0.0–14.0)
NEUT%: 59.7 % (ref 38.4–76.8)
NEUTROS ABS: 3.2 10*3/uL (ref 1.5–6.5)
Platelets: 228 10*3/uL (ref 145–400)
RBC: 4.39 10*6/uL (ref 3.70–5.45)
RDW: 15.5 % — AB (ref 11.2–14.5)
RETIC %: 1.53 % (ref 0.70–2.10)
Retic Ct Abs: 67.17 10*3/uL (ref 33.70–90.70)
WBC: 5.4 10*3/uL (ref 3.9–10.3)
lymph#: 1.4 10*3/uL (ref 0.9–3.3)

## 2015-07-14 LAB — LACTATE DEHYDROGENASE (CC13): LDH: 195 U/L (ref 125–245)

## 2015-07-14 MED ORDER — TAMOXIFEN CITRATE 20 MG PO TABS: 20.0000 mg | ORAL_TABLET | Freq: Every day | ORAL | Status: AC

## 2015-07-14 NOTE — Progress Notes (Signed)
[facility]  [phone] Fax:([phone]) [phone]     ID: Meagan [name] DOB: 1961/02/05  MR#: [id number]  QXI#:[id number]  Patient Care Team: Tammi Sou, MD as PCP - General (Family Medicine) Erroll Luna, MD as Consulting Physician (General Surgery) Chauncey Cruel, MD as Consulting Physician (Oncology) PCP: Tammi Sou, MD GYN: Marylynn Pearson MD OTHER MD: Thea Silversmith MD  CHIEF COMPLAINT:  Triple positive breast cancer  CURRENT TREATMENT: Awaiting definitive treatment   BREAST CANCER HISTORY: Meagan [name] had screening mammography in September 2015 showing left breast calcifications. She was recalled for diagnostic left mammogram 05/06/2014 at the breast Center. This found the breast density to be category C. Magnified views found the calcifications to be consistent with "milk of calcium". Accordingly she was set up for routine screening mammography a year later. However, sometime in July 2016 she noted a bump in her left breast. She eventually brought it to the attention of her primary care physician, who felt some areas of concern in the upper outer quadrants of both breasts.  Screening mammography 06/09/2015 was unremarkable. Bilateral diagnostic mammography with tomography at the breast Center 06/11/2015 found a 7 mm benign cyst in the right breast at the 10:00 position. In the left breast there was a hypoechoic oval mass at the 1:00 position 3 cm from the nipple measuring 10 mm. Biopsy of the left breast mass 06/26/2015 showed ([id number]) an invasive ductal carcinoma, E-cadherin positive, grade 2, estrogen receptor 80% positive, progesterone receptor 90% positive, both with strong staining intensity, with an MIB-1 of 5%, and HER-2 amplified, the signals ratio being 2.27 and the number per cell 5.45.  Bilateral breast MRI 07/06/2015 found a 1 cm mass in the upper outer left breast with central clip artifact. Laterally and inferior to this there was a  TRAM track area of enhancement. This was consistent with benign biopsy tract changes. There were no abnormal appearing lymph nodes. There were 2 subcentimeter foci in the liver which were noted to be hepatic cysts in a prior abdominal CT  Her subsequent history is as detailed below.   INTERVAL HISTORY: Meagan [name] was evaluated in the breast clinic 07/14/2015 accompanied by her significant other Randall Hiss. Her case was also presented in the multidisciplinary breast cancer conference 07/08/2015. At that time a preliminary plan was proposed: Breast conservation surgery followed by HER-2 based therapy with accompanying chemotherapy, as well as radiation, followed by anti-estrogens  REVIEW OF SYSTEMS: Aside from the mass itself, there were no specific symptoms leading to the original mammogram, which was routinely scheduled. The patient denies unusual headaches, visual changes, nausea, vomiting, stiff neck, dizziness, or gait imbalance. There has been no cough, phlegm production, or pleurisy, no chest pain or pressure, and no change in bowel or bladder habits. The patient denies fever, rash, bleeding, unexplained fatigue or unexplained weight loss. Meagan [name] does complain that the biopsy procedure was exceedingly painful and she is planning to write a letter of complaint regarding that. That pain has subsequently largely resolved. A detailed review of systems was otherwise entirely negative.  PAST MEDICAL HISTORY: Past Medical History  Diagnosis Date  . SAB (spontaneous abortion)   . Hyperlipidemia   . Endometrial polyp   . Endometriosis   . History of iron deficiency anemia   . GERD (gastroesophageal reflux disease)     +Barretts esophagus on endo 09/2012  . Arthritis of hand   . Fibroids   . Helicobacter pylori (H. pylori)     positive serology 07/2012  .  Anemia   . Rosacea   . Shortness of breath     on exertion  . Sleep apnea     no CPAP  . H/O hiatal hernia   . Barrett esophagus   . H. pylori infection    . Uterine fibroid   . Breast cancer, left ([facility]) 06/26/15    1 cm mass, + core bx; invasive ductal carcinoma: tentative surgical plan is lumpectomy w/sentinal node mapping/bx    PAST SURGICAL HISTORY: Past Surgical History  Procedure Laterality Date  . Myomectomy abdominal approach    . Polypectomy  2013    ENDOMETRIAL POLYP  . Wisdom tooth extraction    . Hysteroscopy  2010  . Exp. lap. left ovarian cystectomy / lysis adhesions/ myomectomy  2008  . D & c hysteroscopy/ polypectomy/ dx laparoscopy/ lysis adhesions/ ablation endometriosis  04-10-2009  . Hysteroscopy w/d&c  08/25/2011    Procedure: DILATATION AND CURETTAGE /HYSTEROSCOPY;  Surgeon: Marylynn Pearson;  Location: [facility];  Service: Gynecology;;  . Myomectomy    . Esophagogastroduodenoscopy  10/01/12    chronic inactive gastritis (H. pylori neg, no dysplasia or malig),+ Barretts esophagus (Dr. Hilarie Fredrickson)  . Dilatation & curettage/hysteroscopy with trueclear N/A 03/29/2013    Procedure: DILATATION & CURETTAGE/HYSTEROSCOPY WITH TRUECLEAR;  Surgeon: Marylynn Pearson, MD;  Location: [facility];  Service: Gynecology;  Laterality: N/A;  . Breast biopsy  06/29/15    Core needle: + invasive mammary carcinoma  . Breast lumpectomy Left     FAMILY HISTORY Family History  Problem Relation Age of Onset  . Prostate cancer Father   . Hypertension Father   . Anuerysm Father   . Heart disease Father   . Uterine cancer Maternal Grandmother   . Heart disease Mother   . Heart disease Maternal Aunt   . Heart disease Maternal Uncle   . Heart disease Paternal Uncle   . Stomach cancer Other   . Diabetes Other     half sister and brother  . Fibroids Sister     uterine  . Esophageal cancer Neg Hx   . Colon cancer Neg Hx    the patient's father died from a brain aneurysm at the age of 46. The patient's mother is living, age 60. The patient has 3 brothers, one sister. The patient's father was diagnosed with prostate cancer shortly  before his death. On the mother'side, the grandmother may have had uterine cancer. The patient is not aware of any breast or ovarian cancers in the family  GYNECOLOGIC HISTORY:  Patient's last menstrual period was 06/16/2015. Menarche age 5, the patient is G4 P0. She tells me she had 4 miscarriages felt to be due to fibroids. She had a myomectomy procedure but then was unable to become pregnant. She still having regular periods. At this point however she feels she is "too old" to have children and if she became menopausal with treatment that would not be a problem  SOCIAL HISTORY:  Meagan [name] works for [employer]. She is originally from [place]. She is married but separated. Her significant other, Randall Hiss, is [place]. He also works for [employer].    ADVANCED DIRECTIVES: We discussed the fact that if the patient is not legally divorced, her husband, though separated, might claim to be her healthcare power of attorney. Accordingly I gave her the appropriate forms for her to complete and notarize at her discretion.   HEALTH MAINTENANCE: Social History  Substance Use Topics  . Smoking status: Never Smoker   .  Smokeless tobacco: Never Used  . Alcohol Use: 0.6 - 1.2 oz/week    1-2 Glasses of wine per week     Comment: OCCASIONAL     Colonoscopy: 08/13/2013/[facility]  PAP: [facility]  Bone density:  Lipid panel:  No Known Allergies  Current Outpatient Prescriptions  Medication Sig Dispense Refill  . FIBER SELECT GUMMIES CHEW Chew 2 capsules by mouth daily.    . Multiple Vitamin (MULTIVITAMIN WITH MINERALS) TABS tablet Take 1 tablet by mouth daily. *Mixes with all greens and opc 3*    . pantoprazole (PROTONIX) 40 MG tablet TAKE 1 TABLET (40 MG TOTAL) BY MOUTH DAILY 30 tablet 11  . pravastatin (PRAVACHOL) 40 MG tablet Take 1 tablet (40 mg total) by mouth daily. 30 tablet 1  . tamoxifen (NOLVADEX) 20 MG tablet Take 1 tablet (20 mg total) by mouth daily. 90 tablet 12   No current  facility-administered medications for this visit.    OBJECTIVE: Middle-aged Latin American woman who appears younger than stated age 54 Vitals:   07/14/15 1628  BP: 121/76  Pulse: 70  Temp: 97.5 F (36.4 C)  Resp: 18     Body mass index is 23.16 kg/(m^2).    ECOG FS:0 - Asymptomatic  Ocular: Sclerae unicteric, pupils equal, round and reactive to light Ear-nose-throat: Oropharynx clear and moist Lymphatic: No cervical or supraclavicular adenopathy Lungs no rales or rhonchi, good excursion bilaterally Heart regular rate and rhythm, no murmur appreciated Abd soft, nontender, positive bowel sounds MSK no focal spinal tenderness, no joint edema Neuro: non-focal, well-oriented, appropriate affect Breasts: The right breast is unremarkable. In the lateral aspect of the left breast, in the upper outer quadrant, there is an easily palpable superficial mass which may be related to her recent biopsy. There are no skin or nipple changes of concern. The left axilla is benign.   LAB RESULTS:  CMP     Component Value Date/Time   NA 138 07/14/2015 1600   NA 138 05/02/2015 1445   K 4.2 07/14/2015 1600   K 4.1 05/02/2015 1445   CL 105 05/02/2015 1445   CO2 28 07/14/2015 1600   CO2 28 05/02/2015 1445   GLUCOSE 95 07/14/2015 1600   GLUCOSE 92 05/02/2015 1445   BUN 12.2 07/14/2015 1600   BUN 15 05/02/2015 1445   CREATININE 0.9 07/14/2015 1600   CREATININE 0.67 05/02/2015 1445   CALCIUM 9.7 07/14/2015 1600   CALCIUM 9.9 05/02/2015 1445   PROT 7.6 07/14/2015 1600   PROT 7.7 05/02/2015 1445   ALBUMIN 3.8 07/14/2015 1600   ALBUMIN 4.3 05/02/2015 1445   AST 21 07/14/2015 1600   AST 17 05/02/2015 1445   ALT 19 07/14/2015 1600   ALT 13* 05/02/2015 1445   ALKPHOS 55 07/14/2015 1600   ALKPHOS 53 05/02/2015 1445   BILITOT 0.58 07/14/2015 1600   BILITOT 0.7 05/02/2015 1445   GFRNONAA >60 05/02/2015 1445   GFRAA >60 05/02/2015 1445    INo results found for: SPEP, UPEP  Lab Results    Component Value Date   WBC 5.4 07/14/2015   NEUTROABS 3.2 07/14/2015   HGB 12.1 07/14/2015   HCT 36.8 07/14/2015   MCV 83.8 07/14/2015   PLT 228 07/14/2015      Chemistry      Component Value Date/Time   NA 138 07/14/2015 1600   NA 138 05/02/2015 1445   K 4.2 07/14/2015 1600   K 4.1 05/02/2015 1445   CL 105 05/02/2015 1445   CO2 28  07/14/2015 1600   CO2 28 05/02/2015 1445   BUN 12.2 07/14/2015 1600   BUN 15 05/02/2015 1445   CREATININE 0.9 07/14/2015 1600   CREATININE 0.67 05/02/2015 1445      Component Value Date/Time   CALCIUM 9.7 07/14/2015 1600   CALCIUM 9.9 05/02/2015 1445   ALKPHOS 55 07/14/2015 1600   ALKPHOS 53 05/02/2015 1445   AST 21 07/14/2015 1600   AST 17 05/02/2015 1445   ALT 19 07/14/2015 1600   ALT 13* 05/02/2015 1445   BILITOT 0.58 07/14/2015 1600   BILITOT 0.7 05/02/2015 1445       No results found for: LABCA2  No components found for: LABCA125  No results for input(s): INR in the last 168 hours.  Urinalysis    Component Value Date/Time   COLORURINE YELLOW 05/02/2015 1455   APPEARANCEUR CLEAR 05/02/2015 1455   LABSPEC 1.030 05/02/2015 1455   PHURINE 5.5 05/02/2015 1455   GLUCOSEU NEGATIVE 05/02/2015 1455   HGBUR NEGATIVE 05/02/2015 1455   BILIRUBINUR NEGATIVE 05/02/2015 1455   KETONESUR NEGATIVE 05/02/2015 1455   PROTEINUR NEGATIVE 05/02/2015 1455   UROBILINOGEN 0.2 05/02/2015 1455   NITRITE NEGATIVE 05/02/2015 1455   LEUKOCYTESUR NEGATIVE 05/02/2015 1455    STUDIES: Mr Breast Bilateral W Wo Contrast  07/06/2015  CLINICAL DATA:  Recent diagnosis of left breast cancer following biopsy of a palpable approximately 1 cm nodule in the upper-outer quadrant of the left breast. The biopsy was performed with a lateral to medial approach. LABS:  None performed EXAM: BILATERAL BREAST MRI WITH AND WITHOUT CONTRAST TECHNIQUE: Multiplanar, multisequence MR images of both breasts were obtained prior to and following the intravenous administration  of 12 ml of MultiHance. THREE-DIMENSIONAL MR IMAGE RENDERING ON INDEPENDENT WORKSTATION: Three-dimensional MR images were rendered by post-processing of the original MR data on an independent workstation. The three-dimensional MR images were interpreted, and findings are reported in the following complete MRI report for this study. Three dimensional images were evaluated at the independent DynaCad workstation COMPARISON:  Recent bilateral breast imaging at the [facility]. Prior CT abdomen and pelvis dated 05/02/2015. FINDINGS: Breast composition: c. Heterogeneous fibroglandular tissue. Background parenchymal enhancement: Mild Right breast: No mass or abnormal enhancement. Left breast: In the anterior third of the upper outer left breast is an irregular 1.0 x 0.7 x 0.8 cm enhancing mass with plateau kinetics and central biopsy clip artifact. This corresponds to the biopsy-proven malignancy. Lateral and slightly inferior to the mass is a curvilinear area of tram track enhancement that extends to the skin surface, and has some susceptibility artifact centrally, consistent with benign biopsy tract changes. No additional suspicious areas of enhancement are identified in the left breast. Lymph nodes: No abnormal appearing lymph nodes. Ancillary findings: Two sub-cm T2 bright foci in the liver correspond to hepatic cysts seen on prior abdominal CT. IMPRESSION: 1.0 x 0.7 x 0.8 cm biopsy-proven invasive mammary carcinoma of the left breast, anterior third of the upper outer quadrant. Benign biopsy changes are seen lateral to the biopsy-proven malignancy in the left breast. No evidence of malignancy in the right breast. RECOMMENDATION: Treatment planning of the left breast. BI-RADS CATEGORY  6: Known biopsy-proven malignancy. Electronically Signed   By: Curlene Dolphin M.D.   On: 07/06/2015 15:52   Mm Digital Diagnostic Unilat L  06/26/2015  CLINICAL DATA:  Mass 1 o'clock left breast EXAM:  DIAGNOSTIC LEFT MAMMOGRAM POST ULTRASOUND BIOPSY COMPARISON:  Previous exam(s). FINDINGS: Mammographic images were obtained following ultrasound guided  biopsy of the left breast mass. Ribbon shaped marker clip projects in the 1 o'clock position as anticipated. IMPRESSION: Appropriate clip position Final Assessment: Post Procedure Mammograms for Marker Placement Electronically Signed   By: Skipper Cliche M.D.   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 This was found to be concordant by Dr. Skipper Cliche. Pathology was discussed with the patient by telephone. She reported doing well after the biopsy with bruising at the site. Post biopsy instructions and care were reviewed and her questions were answered. Surgical consultation has been scheduled with Dr. Erroll Luna at [facility] on June 30, 2015. The patient was encouraged to come to [facility] for educational materials. My number was provided to her for additional questions and concerns. Pathology results reported by Susa Raring RN, BSN on June 29, 2015. Electronically Signed   By: Skipper Cliche M.D.   On: 06/29/2015 15:42  06/29/2015  CLINICAL DATA:  Suspicious mass left breast 1 o'clock position EXAM: ULTRASOUND GUIDED LEFT BREAST CORE NEEDLE BIOPSY COMPARISON:  Previous exam(s). FINDINGS: I met with the patient and we discussed the procedure of ultrasound-guided biopsy, including benefits and alternatives. We discussed the high likelihood of a successful procedure. We discussed the risks of the procedure, including infection, bleeding, tissue injury, clip migration, and inadequate sampling. Informed written consent was given. The usual time-out protocol was performed immediately prior to  the procedure. Using sterile technique and 2% Lidocaine as local anesthetic, under direct ultrasound visualization, a 12 gauge spring-loaded device was used to perform biopsy of the left breast mass using a lateral to medial approach. At the conclusion of the procedure a tissue marker clip was deployed into the biopsy cavity. Follow up 2 view mammogram was performed and dictated separately. IMPRESSION: Ultrasound guided biopsy of a left breast mass. No apparent complications. Electronically Signed: By: Skipper Cliche M.D. On: 06/26/2015 13:50    CLINICAL DATA: Right lower quadrant abdominal pain. Patient also describing abdominal distention. Symptoms began Tuesday.  EXAM: CT ABDOMEN AND PELVIS WITH CONTRAST  TECHNIQUE: Multidetector CT imaging of the abdomen and pelvis was performed using the standard protocol following bolus administration of intravenous contrast.  CONTRAST: 156mL OMNIPAQUE IOHEXOL 300 MG/ML SOLN  COMPARISON: None.  FINDINGS: Lung bases: Minor dependent subsegmental atelectasis. Otherwise clear. Heart normal in size.  Liver: Small low-density lesions consistent with hepatic cysts. No other abnormality.  Spleen: Small low-density lesion along the anterior margin of the spleen likely a hemangioma. Spleen otherwise unremarkable.  Gallbladder, pancreas, adrenal glands: Normal.  Kidneys, ureters, bladder: Unremarkable.  Uterus and adnexa: Uterus is enlarged by multiple masses. Uterus measures 13.9 x 9.2 x 13.8 cm. Multiple masses show low-attenuation consistent with necrosis. These are likely all fibroids. Largest lesion arises from the right uterine fundus measuring 7 cm. Endometrium is distorted. No adnexal masses.  Lymph nodes: No adenopathy.  Ascites: Trace ascites is seen adjacent to the liver and in the right pelvis.  Gastrointestinal: Unremarkable. Normal appendix is visualized.  Musculoskeletal:  Unremarkable.  IMPRESSION: 1. Normal appendix. 2. Uterus is enlarged by multiple masses, consistent with multiple fibroids. The largest arises from the right uterine fundus measuring 7 cm. Multiple fibroids show low attenuation consistent with necrosis. Fibroid infarction is possible. Patient's pain may be  from the uterus fibroids. There is a trace amount of ascites seen in the right pelvis with minimal fluid adjacent to the liver. 3. No other evidence of an acute finding or explanation for this patient's pain. 4. Small liver cysts.   Electronically Signed  By: Lajean Manes M.D.  On: 05/02/2015 19:09    ASSESSMENT: 54 y.o. [facility] woman status post left breast biopsy 06/26/2015 for a clinical T1b N0, stage IA invasive ductal carcinoma, grade 2, estrogen and progesterone receptor positive, HER-2 amplified, with a low MIB-1  (1) breast conserving surgery planned  (2) antiestrogen immunotherapy with chemotherapy to follow  (3) adjuvant radiation to follow chemotherapy  (4) anti-estrogens to follow radiation  PLAN: We spent the better part of today's hour-long appointment discussing the biology of breast cancer in general, and the specifics of the patient's tumor in particular. Meagan [name] was initially somewhat confused regarding whether or not she had cancer and wondered if an mistake could have been made. We looked at the images of the MRI and I also gave her a copy of the pathology report. After that she felt more convinced that she did indeed have breast cancer and that this needed to be dealt with.  We discussed the difference between local and systemic therapy. She understands that lumpectomy and radiation is as effective as mastectomy in terms of survival. She also understands that this cancer, although small, has likely been present one or 2 years at least, and during that time some breast cancer cells may have silently spread to other parts of her body. Clearly if she had  microscopic spread to the liver or lungs or bone today, removing the cancer from the breast tomorrow would not be curative. To increase the cure rates she needs to undergo systemic therapy.  We discussed the fact that chemotherapy drops the risk by about one third, anti-HER-2 treatment by about 50%, and anti-estrogens by another 50% or so. This means that if she starts with a risk of recurrence after optimal local treatment of 30%, her ultimate risk of recurrence would be in the 5% range.  We discussed the fact that when tumors are this small we do not need to use multi agent chemotherapy but single agent Taxol suffice his. This would be given once a week for 12 weeks and we discussed the possible toxicities, side effects and complications of this agent. She would be receiving anti-HER-2 treatments at the same time and these would continue every 3 weeks to total one year. We also discussed the possible toxicities, side effects and complications of anti-HER-2 immunotherapy.  After completing the chemotherapy portion of her treatment she would proceed to radiation, and once that is completed she would start tamoxifen, since she is still premenopausal.  I strongly urged Meagan [name] to proceed to this sequence which will optimize her chances of definitive cure. However she wonders if alternative treatments might not be as effective. She may want to go visit her family in [place] and see some doctors there. At the same time she is afraid that if she postpones treatment for a while the cancer may grow as an dated May, depending on the length of delay.  After much discussion we decided that she will give tamoxifen to try nail. This may not completely protector because the HER-2 receptor is not being addressed and this can lead to "crossed talk" with the estrogen receptor that can bypass the tamoxifen block. Nevertheless it is better than nothing and I went ahead and placed  the prescription for tamoxifen today after  discussing also the possible toxicities, side effects and complications of that medication.  Meagan [name] will meet with the radiation oncologist tomorrow, which I think will be helpful to clarify additional questions. She would like a second surgical opinion and I will let Dr. Brantley Stage no regarding that.  I'm making her a return appointment here in one month hoping that by then she will have had her surgery and port placement and we can start her systemic treatment. She will call with any problems that may develop before then.  Chauncey Cruel, MD   07/14/2015 6:13 PM Medical Oncology and Hematology [facility] [address] [place],  [address] Tel. [phone]    Fax. [phone]

## 2015-07-15 ENCOUNTER — Encounter: Payer: Self-pay | Admitting: Radiation Oncology

## 2015-07-15 ENCOUNTER — Ambulatory Visit
Admission: RE | Admit: 2015-07-15 | Discharge: 2015-07-15 | Disposition: A | Discharge status: Home / Self Care | Payer: Commercial Managed Care - PPO | Source: Ambulatory Visit | Attending: Radiation Oncology | Admitting: Radiation Oncology

## 2015-07-15 ENCOUNTER — Telehealth: Payer: Self-pay | Admitting: *Deleted

## 2015-07-15 ENCOUNTER — Encounter: Payer: Self-pay | Admitting: *Deleted

## 2015-07-15 ENCOUNTER — Telehealth: Payer: Self-pay | Admitting: Oncology

## 2015-07-15 VITALS — BP 114/68 | HR 66 | Temp 97.5°F | Ht 63.0 in | Wt 131.1 lb

## 2015-07-15 DIAGNOSIS — Z170 Estrogen receptor positive status [ER+]: Secondary | ICD-10-CM | POA: Diagnosis not present

## 2015-07-15 DIAGNOSIS — C50412 Malignant neoplasm of upper-outer quadrant of left female breast: Secondary | ICD-10-CM | POA: Diagnosis not present

## 2015-07-15 LAB — IRON AND TIBC CHCC
%SAT: 14 % — ABNORMAL LOW (ref 21–57)
Iron: 64 ug/dL (ref 41–142)
TIBC: 445 ug/dL — AB (ref 236–444)
UIBC: 381 ug/dL (ref 120–384)

## 2015-07-15 LAB — FERRITIN CHCC: Ferritin: 8 ng/ml — ABNORMAL LOW (ref 9–269)

## 2015-07-15 NOTE — Addendum Note (Signed)
Addended by: Raina Mina E on: 07/15/2015 10:30 AM   Modules accepted: Medications

## 2015-07-15 NOTE — Telephone Encounter (Signed)
Left message for a return phone call to follow up after new patient appt.  Awaiting patient response.

## 2015-07-15 NOTE — Progress Notes (Signed)
Radiation Oncology         [phone]) [phone] ________________________________  Initial outpatient Consultation - Date: 07/15/2015   Name: Meagan [name] MRN: [id number]   DOB: 07-13-1961  REFERRING PHYSICIAN: Erroll Luna, MD  DIAGNOSIS AND STAGE: Breast cancer of upper-outer quadrant of left female breast [facility])   Staging form: Breast, AJCC 7th Edition     Clinical: Stage IA (T1b, N0, M0) - Signed by Chauncey Cruel, MD on 07/14/2015   HISTORY OF PRESENT ILLNESS::Meagan [name] is a 54 y.o. female  who felt a mass in her left breast. She underwent a screening mammogram in October which was negative. A mass was seen in the upper outer quadrant of the left breast which corresponded to a 1 cm mass on ultrasound. Biopsy showed invasive ductal carcinoma ER, PR, and HER-2+. Breast MRI was performed on 07/06/15 which showed a 1 cm mass in the upper outer quadrant of the left breast with no adenopathy and the right breast was negative. She has seen Dr. Jana Hakim and Dr. Brantley Stage. Lumpectomy followed by adjuvant chemotherapy has been recommended.   She reports that she is having pain at level 4 to biopsy site and below the left breast biopsy site skin looks healthy. Her energy level is low in the afternoon. She is accompanied by her significant other, Meagan [name]. She reports experiencing severe bruising after her biopsy. She currently works at [employer]. They have a trip planned to go to [place] in April. They would like to begin treatment as soon as possible.   She is concerned that her myomectomy has been cancelled as the cramps and fatigue associated with her uterine fibroids.   PREVIOUS RADIATION THERAPY: No  Past medical, social and family history were reviewed in the electronic chart. Review of symptoms was reviewed in the electronic chart. Medications were reviewed in the electronic chart.   PHYSICAL EXAM:  Filed Vitals:   07/15/15 1340  BP: 114/68  Pulse: 66  Temp: 97.5 F (36.4 C)  .131 lb  1.6 oz (59.467 kg).   Alert and oriented x3. She has a palpable mass superficial in the upper outer quadrant of the left breast. She has no palpable axillary, supraclavicular, or cervical adenopathy. There was nothing palpable at her biopsy site. Good range of motion of her bilateral extremities.   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 We discussed the equivalence in terms of survival and local failure between mastectomy and breast conservation. We discussed the role of radiation in decreasing local failures in patients who undergo lumpectomy. We discussed the process of simulation and the placement tattoos. We discussed 6 weeks of treatment as an outpatient. We discussed the possibility of asymptomatic lung damage. We discussed the low likelihood of secondary malignancies. We discussed the possible side effects including but not limited to skin redness, fatigue, permanent skin darkening, and breast swelling.  We discussed the use of cardiac sparing with deep inspiration breath hold if needed.  I will plan on seeing her back after her chemotherapy is complete. I encouraged her to continue follow up with Dr. Jana Hakim and to contact Dr. Brantley Stage to schedule port placement and lumpectomy. I will have Almyra Free, the interpretor, get into contact with the patient as well. She met with Varney Biles, our breast cancer navigator, today.  I spent 40 minutes  face to face with the patient and more than 50% of that  time was spent in counseling and/or coordination of care.   ------------------------------------------------  Thea Silversmith, MD  This document serves as a record of services personally performed by Thea Silversmith, MD. It was created on her behalf by Arlyce Harman, a trained medical scribe. The creation of this record is based on the scribe's personal observations and the provider's statements  to them. This document has been checked and approved by the attending provider.

## 2015-07-15 NOTE — Progress Notes (Signed)
Meagan [name] here today for consult breast cancer.  Reports that she is having pain at level 4 to biopsy site and below the left breast biopsy site skin looks healthy.  Energy level is low in the afternoon.    BP 114/68 mmHg  Pulse 66  Temp(Src) 97.5 F (36.4 C) (Oral)  Ht 5\' 3"  (1.6 m)  Wt 131 lb 1.6 oz (59.467 kg)  BMI 23.23 kg/m2  SpO2 100%  LMP 06/16/2015

## 2015-07-15 NOTE — Telephone Encounter (Signed)
s.w. pt and advised on Jan appt.....pt ok and aware °

## 2015-07-15 NOTE — Addendum Note (Signed)
Encounter addended by: Benn Moulder, RN on: 07/15/2015  5:50 PM<BR>     Documentation filed: Charges VN

## 2015-07-16 ENCOUNTER — Ambulatory Visit: Payer: Self-pay | Admitting: Surgery

## 2015-07-16 ENCOUNTER — Inpatient Hospital Stay: Admit: 2015-07-16 | Payer: PRIVATE HEALTH INSURANCE | Admitting: Obstetrics and Gynecology

## 2015-07-16 ENCOUNTER — Other Ambulatory Visit: Payer: Self-pay | Admitting: Surgery

## 2015-07-16 DIAGNOSIS — C50912 Malignant neoplasm of unspecified site of left female breast: Secondary | ICD-10-CM

## 2015-07-16 LAB — VITAMIN B12: Vitamin B-12: 877 pg/mL (ref 211–911)

## 2015-07-16 LAB — ERYTHROPOIETIN: Erythropoietin: 22.9 m[IU]/mL — ABNORMAL HIGH (ref 2.6–18.5)

## 2015-07-16 LAB — FOLATE

## 2015-07-16 SURGERY — HYSTERECTOMY, ABDOMINAL
Anesthesia: Choice

## 2015-07-17 ENCOUNTER — Other Ambulatory Visit: Payer: Self-pay | Admitting: *Deleted

## 2015-07-17 ENCOUNTER — Telehealth: Payer: Self-pay | Admitting: Oncology

## 2015-07-17 DIAGNOSIS — C50412 Malignant neoplasm of upper-outer quadrant of left female breast: Secondary | ICD-10-CM

## 2015-07-17 NOTE — Progress Notes (Signed)
Spoke with patient to make sure she was aware of her surgery date and inform her we would get her scheduled for and echocardiogram and chemo education class the 1st week of January.  She states she was asked about having children by Dr. Jana Hakim and at first she said no but started thinking she may want more information.  I contacted [facility] and they are going to call her and set up an appointment.  I have also given Almyra Free our interpreter the patient's information as a resource.    Encouraged the patient to call with any needs or concerns.

## 2015-07-17 NOTE — Telephone Encounter (Signed)
Appointments made and patient called with echo.  Note to [facility] to enter a referral for echo and bensimhon and they will call her

## 2015-07-21 ENCOUNTER — Encounter (HOSPITAL_BASED_OUTPATIENT_CLINIC_OR_DEPARTMENT_OTHER): Payer: Self-pay | Admitting: *Deleted

## 2015-07-27 ENCOUNTER — Ambulatory Visit
Admission: RE | Admit: 2015-07-27 | Discharge: 2015-07-27 | Disposition: A | Discharge status: Home / Self Care | Payer: Commercial Managed Care - PPO | Source: Ambulatory Visit | Attending: Surgery | Admitting: Surgery

## 2015-07-27 DIAGNOSIS — C50912 Malignant neoplasm of unspecified site of left female breast: Secondary | ICD-10-CM

## 2015-07-28 ENCOUNTER — Other Ambulatory Visit: Payer: Self-pay | Admitting: Surgery

## 2015-07-28 ENCOUNTER — Ambulatory Visit (HOSPITAL_BASED_OUTPATIENT_CLINIC_OR_DEPARTMENT_OTHER): Payer: Commercial Managed Care - PPO | Admitting: Anesthesiology

## 2015-07-28 ENCOUNTER — Ambulatory Visit
Admission: RE | Admit: 2015-07-28 | Discharge: 2015-07-28 | Disposition: A | Discharge status: Home / Self Care | Payer: Commercial Managed Care - PPO | Source: Ambulatory Visit | Attending: Surgery | Admitting: Surgery

## 2015-07-28 ENCOUNTER — Encounter (HOSPITAL_COMMUNITY): Payer: Commercial Managed Care - PPO

## 2015-07-28 ENCOUNTER — Encounter (HOSPITAL_BASED_OUTPATIENT_CLINIC_OR_DEPARTMENT_OTHER)
Admission: RE | Disposition: A | Discharge status: Home / Self Care | Payer: Self-pay | Source: Ambulatory Visit | Attending: Surgery

## 2015-07-28 ENCOUNTER — Ambulatory Visit (HOSPITAL_COMMUNITY): Payer: Commercial Managed Care - PPO

## 2015-07-28 ENCOUNTER — Encounter (HOSPITAL_BASED_OUTPATIENT_CLINIC_OR_DEPARTMENT_OTHER): Payer: Self-pay | Admitting: *Deleted

## 2015-07-28 ENCOUNTER — Encounter (HOSPITAL_COMMUNITY)
Admission: RE | Admit: 2015-07-28 | Discharge: 2015-07-28 | Disposition: A | Discharge status: Home / Self Care | Payer: Commercial Managed Care - PPO | Source: Ambulatory Visit | Attending: Surgery | Admitting: Surgery

## 2015-07-28 ENCOUNTER — Ambulatory Visit (HOSPITAL_BASED_OUTPATIENT_CLINIC_OR_DEPARTMENT_OTHER)
Admission: RE | Admit: 2015-07-28 | Discharge: 2015-07-28 | Disposition: A | Discharge status: Home / Self Care | Payer: Commercial Managed Care - PPO | Source: Ambulatory Visit | Attending: Surgery | Admitting: Surgery

## 2015-07-28 DIAGNOSIS — E7800 Pure hypercholesterolemia, unspecified: Secondary | ICD-10-CM | POA: Insufficient documentation

## 2015-07-28 DIAGNOSIS — C50912 Malignant neoplasm of unspecified site of left female breast: Secondary | ICD-10-CM | POA: Insufficient documentation

## 2015-07-28 DIAGNOSIS — J45909 Unspecified asthma, uncomplicated: Secondary | ICD-10-CM | POA: Insufficient documentation

## 2015-07-28 DIAGNOSIS — Z90710 Acquired absence of both cervix and uterus: Secondary | ICD-10-CM | POA: Diagnosis not present

## 2015-07-28 DIAGNOSIS — K219 Gastro-esophageal reflux disease without esophagitis: Secondary | ICD-10-CM | POA: Diagnosis not present

## 2015-07-28 DIAGNOSIS — G4730 Sleep apnea, unspecified: Secondary | ICD-10-CM | POA: Insufficient documentation

## 2015-07-28 DIAGNOSIS — Z880 Allergy status to penicillin: Secondary | ICD-10-CM | POA: Diagnosis not present

## 2015-07-28 DIAGNOSIS — Z79899 Other long term (current) drug therapy: Secondary | ICD-10-CM | POA: Diagnosis not present

## 2015-07-28 DIAGNOSIS — Z95828 Presence of other vascular implants and grafts: Secondary | ICD-10-CM

## 2015-07-28 HISTORY — PX: PORTACATH PLACEMENT: SHX2246

## 2015-07-28 HISTORY — PX: BREAST LUMPECTOMY WITH RADIOACTIVE SEED AND SENTINEL LYMPH NODE BIOPSY: SHX6550

## 2015-07-28 SURGERY — BREAST LUMPECTOMY WITH RADIOACTIVE SEED AND SENTINEL LYMPH NODE BIOPSY
Anesthesia: General | Site: Chest | Laterality: Right

## 2015-07-28 MED ORDER — HYDROMORPHONE HCL 1 MG/ML IJ SOLN
0.2500 mg | INTRAMUSCULAR | Status: DC | PRN
  Administered 2015-07-28: 0.5 mg via INTRAVENOUS

## 2015-07-28 MED ORDER — LACTATED RINGERS IV SOLN
INTRAVENOUS | Status: DC
  Administered 2015-07-28: 10 mL/h via INTRAVENOUS
  Administered 2015-07-28: 14:00:00 via INTRAVENOUS

## 2015-07-28 MED ORDER — HEPARIN SOD (PORK) LOCK FLUSH 100 UNIT/ML IV SOLN
INTRAVENOUS | Status: AC
  Filled 2015-07-28: qty 5

## 2015-07-28 MED ORDER — FENTANYL CITRATE (PF) 100 MCG/2ML IJ SOLN
INTRAMUSCULAR | Status: AC
  Filled 2015-07-28: qty 2

## 2015-07-28 MED ORDER — CEFAZOLIN SODIUM-DEXTROSE 2-3 GM-% IV SOLR
INTRAVENOUS | Status: AC
  Filled 2015-07-28: qty 50

## 2015-07-28 MED ORDER — MIDAZOLAM HCL 2 MG/2ML IJ SOLN
1.0000 mg | INTRAMUSCULAR | Status: DC | PRN
  Administered 2015-07-28: 2 mg via INTRAVENOUS

## 2015-07-28 MED ORDER — BUPIVACAINE-EPINEPHRINE (PF) 0.25% -1:200000 IJ SOLN
INTRAMUSCULAR | Status: DC | PRN
  Administered 2015-07-28: 16 mL

## 2015-07-28 MED ORDER — CEFAZOLIN SODIUM 10 G IJ SOLR: 3.0000 g | INTRAMUSCULAR | Status: DC

## 2015-07-28 MED ORDER — CHLORHEXIDINE GLUCONATE 4 % EX LIQD
1.0000 | Freq: Once | CUTANEOUS | Status: DC
Start: 2015-07-28 — End: 2015-07-28

## 2015-07-28 MED ORDER — HEPARIN (PORCINE) IN NACL 2-0.9 UNIT/ML-% IJ SOLN
INTRAMUSCULAR | Status: AC
  Filled 2015-07-28: qty 500

## 2015-07-28 MED ORDER — HYDROMORPHONE HCL 1 MG/ML IJ SOLN
INTRAMUSCULAR | Status: AC
  Filled 2015-07-28: qty 1

## 2015-07-28 MED ORDER — DEXAMETHASONE SODIUM PHOSPHATE 4 MG/ML IJ SOLN
INTRAMUSCULAR | Status: DC | PRN
  Administered 2015-07-28: 10 mg via INTRAVENOUS

## 2015-07-28 MED ORDER — EPHEDRINE SULFATE 50 MG/ML IJ SOLN
INTRAMUSCULAR | Status: AC
  Filled 2015-07-28: qty 1

## 2015-07-28 MED ORDER — FENTANYL CITRATE (PF) 100 MCG/2ML IJ SOLN
50.0000 ug | INTRAMUSCULAR | Status: AC | PRN
  Administered 2015-07-28 (×2): 50 ug via INTRAVENOUS
  Administered 2015-07-28: 100 ug via INTRAVENOUS

## 2015-07-28 MED ORDER — ONDANSETRON HCL 4 MG/2ML IJ SOLN
INTRAMUSCULAR | Status: AC
  Filled 2015-07-28: qty 2

## 2015-07-28 MED ORDER — SUCCINYLCHOLINE CHLORIDE 20 MG/ML IJ SOLN
INTRAMUSCULAR | Status: AC
  Filled 2015-07-28: qty 1

## 2015-07-28 MED ORDER — LIDOCAINE HCL (CARDIAC) 20 MG/ML IV SOLN
INTRAVENOUS | Status: DC | PRN
  Administered 2015-07-28: 50 mg via INTRAVENOUS

## 2015-07-28 MED ORDER — DEXAMETHASONE SODIUM PHOSPHATE 10 MG/ML IJ SOLN
INTRAMUSCULAR | Status: AC
  Filled 2015-07-28: qty 1

## 2015-07-28 MED ORDER — OXYCODONE-ACETAMINOPHEN 5-325 MG PO TABS: 1.0000 | ORAL_TABLET | ORAL | Status: DC | PRN

## 2015-07-28 MED ORDER — GLYCOPYRROLATE 0.2 MG/ML IJ SOLN: 0.2000 mg | Freq: Once | INTRAMUSCULAR | Status: DC | PRN

## 2015-07-28 MED ORDER — KETOROLAC TROMETHAMINE 30 MG/ML IJ SOLN: 30.0000 mg | Freq: Once | INTRAMUSCULAR | Status: DC

## 2015-07-28 MED ORDER — PROPOFOL 500 MG/50ML IV EMUL
INTRAVENOUS | Status: AC
  Filled 2015-07-28: qty 50

## 2015-07-28 MED ORDER — LIDOCAINE HCL (CARDIAC) 20 MG/ML IV SOLN
INTRAVENOUS | Status: AC
  Filled 2015-07-28: qty 5

## 2015-07-28 MED ORDER — DEXTROSE 5 % IV SOLN
3.0000 g | INTRAVENOUS | Status: AC
  Administered 2015-07-28: 2 g via INTRAVENOUS

## 2015-07-28 MED ORDER — HEPARIN SOD (PORK) LOCK FLUSH 100 UNIT/ML IV SOLN
INTRAVENOUS | Status: DC | PRN
  Administered 2015-07-28: 500 [IU] via INTRAVENOUS

## 2015-07-28 MED ORDER — HEPARIN (PORCINE) IN NACL 2-0.9 UNIT/ML-% IJ SOLN
INTRAMUSCULAR | Status: DC | PRN
  Administered 2015-07-28: 500 mL

## 2015-07-28 MED ORDER — PROMETHAZINE HCL 25 MG/ML IJ SOLN: 6.2500 mg | INTRAMUSCULAR | Status: DC | PRN

## 2015-07-28 MED ORDER — TECHNETIUM TC 99M SULFUR COLLOID FILTERED
1.0000 | Freq: Once | INTRAVENOUS | Status: AC | PRN
  Administered 2015-07-28: 1 via INTRADERMAL

## 2015-07-28 MED ORDER — FENTANYL CITRATE (PF) 100 MCG/2ML IJ SOLN
INTRAMUSCULAR | Status: DC | PRN
  Administered 2015-07-28: 25 ug via INTRAVENOUS

## 2015-07-28 MED ORDER — MIDAZOLAM HCL 2 MG/2ML IJ SOLN
INTRAMUSCULAR | Status: AC
  Filled 2015-07-28: qty 2

## 2015-07-28 MED ORDER — SCOPOLAMINE 1 MG/3DAYS TD PT72: 1.0000 | MEDICATED_PATCH | Freq: Once | TRANSDERMAL | Status: DC

## 2015-07-28 MED ORDER — EPHEDRINE SULFATE 50 MG/ML IJ SOLN
INTRAMUSCULAR | Status: DC | PRN
  Administered 2015-07-28 (×2): 10 mg via INTRAVENOUS

## 2015-07-28 MED ORDER — DEXTROSE 5 % IV SOLN: 3.0000 g | INTRAVENOUS | Status: DC

## 2015-07-28 MED ORDER — PROPOFOL 10 MG/ML IV BOLUS
INTRAVENOUS | Status: AC
  Filled 2015-07-28: qty 20

## 2015-07-28 MED ORDER — PROPOFOL 10 MG/ML IV BOLUS
INTRAVENOUS | Status: DC | PRN
  Administered 2015-07-28: 200 mg via INTRAVENOUS

## 2015-07-28 MED ORDER — ONDANSETRON HCL 4 MG/2ML IJ SOLN
INTRAMUSCULAR | Status: DC | PRN
  Administered 2015-07-28: 4 mg via INTRAVENOUS

## 2015-07-28 MED ORDER — BUPIVACAINE HCL (PF) 0.5 % IJ SOLN
INTRAMUSCULAR | Status: DC | PRN
  Administered 2015-07-28: 30 mL via PERINEURAL

## 2015-07-28 SURGICAL SUPPLY — 61 items
APL SKNCLS STERI-STRIP NONHPOA (GAUZE/BANDAGES/DRESSINGS)
APPLIER CLIP 9.375 MED OPEN (MISCELLANEOUS) ×3
APR CLP MED 9.3 20 MLT OPN (MISCELLANEOUS) ×2
BAG DECANTER FOR FLEXI CONT (MISCELLANEOUS) ×3 IMPLANT
BENZOIN TINCTURE PRP APPL 2/3 (GAUZE/BANDAGES/DRESSINGS) IMPLANT
BINDER BREAST LRG (GAUZE/BANDAGES/DRESSINGS) ×1 IMPLANT
BINDER BREAST MEDIUM (GAUZE/BANDAGES/DRESSINGS) IMPLANT
BLADE HEX COATED 2.75 (ELECTRODE) ×3 IMPLANT
BLADE SURG 11 STRL SS (BLADE) ×3 IMPLANT
BLADE SURG 15 STRL LF DISP TIS (BLADE) ×2 IMPLANT
BLADE SURG 15 STRL SS (BLADE) ×3
CANISTER SUCT 1200ML W/VALVE (MISCELLANEOUS) ×3 IMPLANT
CHLORAPREP W/TINT 26ML (MISCELLANEOUS) ×3 IMPLANT
CLIP APPLIE 9.375 MED OPEN (MISCELLANEOUS) ×2 IMPLANT
COVER BACK TABLE 60X90IN (DRAPES) ×3 IMPLANT
COVER MAYO STAND STRL (DRAPES) ×3 IMPLANT
COVER PROBE 5X48 (MISCELLANEOUS) ×3
COVER PROBE W GEL 5X96 (DRAPES) ×3 IMPLANT
COVER SURGICAL LIGHT HANDLE (MISCELLANEOUS) ×1 IMPLANT
DEVICE DUBIN W/COMP PLATE 8390 (MISCELLANEOUS) ×3 IMPLANT
DRAPE C-ARM 42X72 X-RAY (DRAPES) ×3 IMPLANT
DRAPE LAPAROSCOPIC ABDOMINAL (DRAPES) ×3 IMPLANT
DRAPE UTILITY XL STRL (DRAPES) ×3 IMPLANT
DRSG TEGADERM 2-3/8X2-3/4 SM (GAUZE/BANDAGES/DRESSINGS) IMPLANT
ELECT COATED BLADE 2.86 ST (ELECTRODE) ×3 IMPLANT
ELECT REM PT RETURN 9FT ADLT (ELECTROSURGICAL) ×3
ELECTRODE REM PT RTRN 9FT ADLT (ELECTROSURGICAL) ×2 IMPLANT
GEL ULTRASOUND 8.5O AQUASONIC (MISCELLANEOUS) ×3 IMPLANT
GLOVE BIOGEL PI IND STRL 7.0 (GLOVE) IMPLANT
GLOVE BIOGEL PI IND STRL 8 (GLOVE) ×2 IMPLANT
GLOVE BIOGEL PI INDICATOR 7.0 (GLOVE) ×3
GLOVE BIOGEL PI INDICATOR 8 (GLOVE) ×1
GLOVE ECLIPSE 6.5 STRL STRAW (GLOVE) ×1 IMPLANT
GLOVE ECLIPSE 8.0 STRL XLNG CF (GLOVE) ×4 IMPLANT
GLOVE SURG SS PI 6.5 STRL IVOR (GLOVE) ×1 IMPLANT
GOWN STRL REUS W/ TWL LRG LVL3 (GOWN DISPOSABLE) ×4 IMPLANT
GOWN STRL REUS W/TWL LRG LVL3 (GOWN DISPOSABLE) ×9
HEMOSTAT SNOW SURGICEL 2X4 (HEMOSTASIS) ×3 IMPLANT
KIT CVR 48X5XPRB PLUP LF (MISCELLANEOUS) ×2 IMPLANT
KIT MARKER MARGIN INK (KITS) ×3 IMPLANT
KIT PORT POWER 8FR ISP CVUE (Catheter) ×1 IMPLANT
LIQUID BAND (GAUZE/BANDAGES/DRESSINGS) ×3 IMPLANT
NDL HYPO 25X1 1.5 SAFETY (NEEDLE) ×2 IMPLANT
NEEDLE HYPO 25X1 1.5 SAFETY (NEEDLE) ×3 IMPLANT
NS IRRIG 1000ML POUR BTL (IV SOLUTION) ×3 IMPLANT
PACK BASIN DAY SURGERY FS (CUSTOM PROCEDURE TRAY) ×3 IMPLANT
PENCIL BUTTON HOLSTER BLD 10FT (ELECTRODE) ×3 IMPLANT
SLEEVE SCD COMPRESS KNEE MED (MISCELLANEOUS) ×3 IMPLANT
SPONGE GAUZE 4X4 12PLY STER LF (GAUZE/BANDAGES/DRESSINGS) IMPLANT
SPONGE LAP 4X18 X RAY DECT (DISPOSABLE) ×4 IMPLANT
STRIP CLOSURE SKIN 1/2X4 (GAUZE/BANDAGES/DRESSINGS) IMPLANT
SUT MNCRL AB 4-0 PS2 18 (SUTURE) ×3 IMPLANT
SUT MON AB 4-0 PC3 18 (SUTURE) ×4 IMPLANT
SUT PROLENE 2 0 SH DA (SUTURE) ×3 IMPLANT
SUT VICRYL 3-0 CR8 SH (SUTURE) ×4 IMPLANT
SYR 5ML LUER SLIP (SYRINGE) ×3 IMPLANT
SYR CONTROL 10ML LL (SYRINGE) ×3 IMPLANT
TOWEL OR 17X24 6PK STRL BLUE (TOWEL DISPOSABLE) ×6 IMPLANT
TOWEL OR NON WOVEN STRL DISP B (DISPOSABLE) ×3 IMPLANT
TUBE CONNECTING 20X1/4 (TUBING) ×3 IMPLANT
YANKAUER SUCT BULB TIP NO VENT (SUCTIONS) ×3 IMPLANT

## 2015-07-28 NOTE — H&P (View-Only) (Signed)
Meagan [name] 06/30/2015 2:20 PM Location: [facility] Patient #: [id number] DOB: 02/09/1940 Married / Language: English / Race: White Female  History of Present Illness Meagan [name]. Meagan Rada MD; 06/30/2015 5:00 PM) Patient words: Patient sent at the request of Dr. Ammie [name] for right breast cancer. She underwent screening mammography which showed 2 densities right breast upper inner quadrant 1:00 within [name] centimeter of each other. 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 Each measures approximately 1 cm. Denies any history of breast mass nipple discharge or change in the appearance of her breasts. She has no significant family history of breast cancer except for her mother in her 56s. She is sore from her biopsy.          ADDITIONAL INFORMATION: 1. PROGNOSTIC INDICATORS Results: IMMUNOHISTOCHEMICAL AND MORPHOMETRIC ANALYSIS PERFORMED MANUALLY Estrogen Receptor: 100%, POSITIVE, STRONG STAINING INTENSITY Progesterone Receptor: 10%, POSITIVE, MODERATE STAINING INTENSITY Proliferation Marker Ki67: 10% REFERENCE RANGE ESTROGEN RECEPTOR NEGATIVE 0% POSITIVE =>1% REFERENCE RANGE PROGESTERONE RECEPTOR NEGATIVE 0% POSITIVE =>1% All controls stained appropriately Meagan Cutter MD Pathologist, Electronic Signature ( Signed 06/25/2015) 1. FLUORESCENCE IN-SITU HYBRIDIZATION Results: HER2 - **POSITIVE** RATIO OF HER2/CEP17 SIGNALS 4.75 AVERAGE HER2 COPY NUMBER PER CELL 12.35 Reference Range: NEGATIVE HER2/CEP17 Ratio <2.0 and average HER2 copy number <4.0 EQUIVOCAL HER2/CEP17 Ratio <2.0 and average HER2 copy number 4.0 and <6.0 1 of 4 FINAL for Meagan [name] ([id number]) ADDITIONAL INFORMATION:(continued) POSITIVE HER2/CEP17 Ratio >=2.0 or <2.0 and average HER2 copy number >=6.0 Meagan Cutter MD Pathologist, Electronic Signature ( Signed 06/25/2015) 2. PROGNOSTIC INDICATORS Results: IMMUNOHISTOCHEMICAL AND  MORPHOMETRIC ANALYSIS PERFORMED MANUALLY Estrogen Receptor: 100%, POSITIVE, STRONG STAINING INTENSITY Progesterone Receptor: 0%, NEGATIVE Proliferation Marker Ki67: 20% COMMENT: The negative hormone receptor study(ies) in this case has no internal positive control. REFERENCE RANGE ESTROGEN RECEPTOR NEGATIVE 0% POSITIVE =>1% REFERENCE RANGE PROGESTERONE RECEPTOR NEGATIVE 0% POSITIVE =>1% All controls stained appropriately Meagan Cutter MD Pathologist, Electronic Signature ( Signed 06/25/2015) 2. FLUORESCENCE IN-SITU HYBRIDIZATION Results: HER2 - **POSITIVE** RATIO OF HER2/CEP17 SIGNALS 4.73 AVERAGE HER2 COPY NUMBER PER CELL 11.60 Reference Range: NEGATIVE HER2/CEP17 Ratio <2.0 and average HER2 copy number <4.0 EQUIVOCAL HER2/CEP17 Ratio <2.0 and average HER2 copy number 4.0 and <6.0 POSITIVE HER2/CEP17 Ratio >=2.0 or <2.0 and average HER2 copy number >=6.0 Meagan Cutter MD Pathologist, Electronic Signature ( Signed 06/25/2015) 2 of 4 FINAL for Meagan [name] ([id number]) FINAL DIAGNOSIS Diagnosis 1. Breast, right, needle core biopsy, 1:00 o'clock, 9 CMFN - INVASIVE DUCTAL CARCINOMA. - DUCTAL CARCINOMA IN SITU. - SEE COMMENT. 2. Breast, right, needle core biopsy, 1:00 o'clock, 10 CMFN - INVASIVE DUCTAL CARCINOMA. - DUCTAL CARCINOMA IN SITU WITH ASSOCIATED CALCIFICATIONS. - SEE COMMENT. Microscopic Comment 1. Although definitive grading of breast carcinoma is best done on excision, the features of the invasive tumor from the right 1 o'clock needle core biopsy 9 cm from the nipple are compatible with [name] grade 1 to 2 breast carcinoma. Breast prognostic markers will be performed and reported in an addendum. Findings are called to the Meagan [name] on 06/23/2015. Dr. Donato [name] has seen the first specimen in consultation with agreement. 2. Although definitive grading of breast carcinoma is best done on excision, the features of the invasive tumor from the right 1 o'clock  biopsy 10 cm from the nipple are compatible with [name] grade 1 to 2 breast carcinoma. Breast prognostic markers will be performed and reported in an addendum. Findings are called to the Meagan [name] on 06/23/2015.  Dr. Donato [name] has seen the second specimen in consultation with agreement. (RH:ds 06/23/15) Meagan Niece MD Pathologist, Electronic Signature (Case signed 06/23/2015) Specimen Gross and Clinical Information Specimen Comment 1. In formalin 2:30, extracted <5 mins; screening detected right breast masses 2. In formalin 2:50, extracted <5 mins Specimen(s) Obtained: 1. Breast, right, needle core biopsy, 1:00 o'clock, 9 CMFN 2. Breast, right, needle core biopsy, 1:00 o'clock, 10 CMFN Specimen Clinical Information 1. Suspect malignancy Gross 1. Received in formalin are 4 cores of soft, tan-red tissue which measure 1.4 x 0.2 x 0.2 cm and up to 1.6 x 0.2 x 0.2 cm. Submitted in toto in 1 block(s). Time in Formalin 2:30 pm (sk) 2. Received in formalin are 3 cores of soft, tan-red tissue which measure 1.4 x 0.3 x 0.2 cm and up to 2.4 x 0.3 x 0.2 cm. Submitted in toto in 1 block(s). Time in Formalin 2:50 pm (sk) Stain(s) used in Diagnosis: The following stain(s) were used in diagnosing the case: KI-67-ACIS, PR-ACIS, Her2 FISH, ER-ACIS. The control(s) stained appropriately. Disclaimer PR progesterone receptor (PgR 636), immunohistochemical stains are performed on formalin fixed, paraffin embedded tissue using [name] 3,3"-diaminobenzidine (DAB) chromogen and [employer] 3 of 4 FINAL for Meagan [name] ([id number]) Disclaimer(continued) System. The staining intensity of the nucleus is scored morphometrically using the Automated Cellular Imaging System (ACIS) and is reported as the percentage of tumor cell nuclei demonstrating specific nuclear staining. HER2 IQFISH pharmDX (code [id number]) is [name] direct fluorescence in-situ hybridization assay designed to quantitatively determine HER2 gene  amplification in formalin-fixed, paraffin-embedded tissue specimens. It is performed at [facility] and is reported using ASCO/CAP scoring criteria published in 2013. Ki-67 (Mib-1), immunohistochemical stains are performed on formalin fixed, paraffin embedded tissue using [name] 3,3"-diaminobenzidine (DAB) chromogen and [place]. The staining intensity of the nucleus is scored morphometrically using the Automated Cellular Imaging System (ACIS) and is reported as the percentage of tumor cell nuclei demonstrating specific nuclear staining. Estrogen receptor (SP1), immunohistochemical stains are performed on formalin fixed, paraffin embedded tissue using [name] 3,3"-diaminobenzidine (DAB) chromogen and [employer] Autostainer System. The staining intensity of the nucleus is scored morphometrically using the Automated Cellular Imaging System (ACIS) and is reported as the percentage of tumor cell nuclei demonstrating specific nuclear staining. Report signed out from the following location(s) Technical Component was performed at [facility]. [address],[place] [address].[id number]., Technical component and interpretation was performed at [address], [place], [place] [address]. CLIA #: [id number],      CLINICAL DATA: Patient returns after screening study for evaluation of possible right breast mass.  EXAM: DIGITAL DIAGNOSTIC RIGHT MAMMOGRAM WITH 3D TOMOSYNTHESIS WITH CAD  ULTRASOUND RIGHT BREAST  COMPARISON: 06/05/2015 and earlier  ACR Breast Density Category c: The breast tissue is heterogeneously dense, which may obscure small masses.  FINDINGS: Additional tomosynthesis images are performed, confirming presence of 2 spiculated masses in the upper central portion of the right breast.  Mammographic images were processed with CAD.  On physical exam, I palpate no abnormality in the upper  central portion of the right breast.  Targeted ultrasound is performed, showing [name] small hypoechoic irregular mass with hyperechoic rim in the 1 o'clock location of the right breast 10 cm from the nipple. Mass measures 0.7 x 0.5 x 0.6 cm. [name] second mass is identified in the 1 o'clock location 9 cm from the nipple which measures 0.7 x 0.7 x 0.6 cm. The 2 masses are 2.4 cm apart. Evaluation  of the right axilla is negative for adenopathy.  IMPRESSION: 1. Two suspicious masses in the 1 o'clock location of the right breast warranting tissue diagnosis. 2. No evidence for adenopathy.  RECOMMENDATION: Ultrasound-guided core biopsies are recommended and scheduled for the patient on 06/22/2015 at 2 o'clock p.m.  I have discussed the findings and recommendations with the patient. Results were also provided in writing at the conclusion of the.  The patient is [name] 54 year old female.   Other Problems Elbert Ewings, CMA; 06/30/2015 2:21 PM) Asthma Breast Cancer Gastroesophageal Reflux Disease High blood pressure Hypercholesterolemia  Past Surgical History Elbert Ewings, CMA; 06/30/2015 2:21 PM) Breast Biopsy Right. Hysterectomy (not due to cancer) - Partial  Diagnostic Studies History Elbert Ewings, CMA; 06/30/2015 2:21 PM) Colonoscopy never Mammogram within last year Pap Smear >5 years ago  Allergies Elbert Ewings, CMA; 06/30/2015 2:21 PM) Penicillin V *PENICILLINS*  Medication History Elbert Ewings, CMA; 06/30/2015 2:24 PM) ProAir HFA (108 (90 Base)MCG/ACT Aerosol Soln, Inhalation) Active. Omeprazole (40MG Capsule DR, Oral) Active. Calcium (500MG Tablet, Oral) Active. Vitamin D3 (1000UNIT Tablet, Oral) Active. Multiple Vitamin (Oral) Active. MetFORMIN HCl (500MG Tablet, Oral) Active. Losartan Potassium (25MG Tablet, Oral) Active. Montelukast Sodium (10MG Tablet, Oral) Active. Cinnamon Plus Chromium (100-500MCG-MG Capsule, Oral) Active. Garlic Oil (9937JI Capsule,  Oral) Active. Medications Reconciled  Social History Elbert Ewings, [place]; 06/30/2015 2:21 PM) Caffeine use Tea. No alcohol use No drug use Tobacco use Never smoker.  Family History Elbert Ewings, [place]; 06/30/2015 2:21 PM) Breast Cancer Mother. Hypertension Mother.  Pregnancy / Birth History Elbert Ewings, CMA; 06/30/2015 2:21 PM) Age at menarche 9 years. Gravida 0 Para 0     Review of Systems Elbert Ewings CMA; 06/30/2015 2:21 PM) General Present- Fatigue. Not Present- Appetite Loss, Chills, Fever, Night Sweats, Weight Gain and Weight Loss. Skin Not Present- Change in Wart/Mole, Dryness, Hives, Jaundice, New Lesions, Non-Healing Wounds, Rash and Ulcer. HEENT Present- Hoarseness and Wears glasses/contact lenses. Not Present- Earache, Hearing Loss, Nose Bleed, Oral Ulcers, Ringing in the Ears, Seasonal Allergies, Sinus Pain, Sore Throat, Visual Disturbances and Yellow Eyes. Respiratory Present- Chronic Cough and Snoring. Not Present- Bloody sputum, Difficulty Breathing and Wheezing. Breast Not Present- Breast Mass, Breast Pain, Nipple Discharge and Skin Changes. Cardiovascular Not Present- Chest Pain, Difficulty Breathing Lying Down, Leg Cramps, Palpitations, Rapid Heart Rate, Shortness of Breath and Swelling of Extremities. Gastrointestinal Not Present- Abdominal Pain, Bloating, Bloody Stool, Change in Bowel Habits, Chronic diarrhea, Constipation, Difficulty Swallowing, Excessive gas, Gets full quickly at meals, Hemorrhoids, Indigestion, Nausea, Rectal Pain and Vomiting. Female Genitourinary Not Present- Frequency, Nocturia, Painful Urination, Pelvic Pain and Urgency. Musculoskeletal Not Present- Back Pain, Joint Pain, Joint Stiffness, Muscle Pain, Muscle Weakness and Swelling of Extremities. Psychiatric Not Present- Anxiety, Bipolar, Change in Sleep Pattern, Depression, Fearful and Frequent crying. Endocrine Present- Hair Changes. Not Present- Cold Intolerance, Excessive Hunger,  Heat Intolerance, Hot flashes and New Diabetes. Hematology Present- Easy Bruising. Not Present- Excessive bleeding, Gland problems, HIV and Persistent Infections.  Vitals Elbert Ewings CMA; 06/30/2015 2:24 PM) 06/30/2015 2:24 PM Weight: 116.8 lb Height: 60in Body Surface Area: 1.49 m Body Mass Index: 22.81 kg/m  Temp.: 98.55F(Temporal)  Pulse: 81 (Regular)  BP: 130/64 (Sitting, Left Arm, Standard)      Physical Exam (Airi Copado [name]. Raaga Maeder MD; 06/30/2015 5:01 PM)  General Mental Status-Alert. General Appearance-Consistent with stated age. Hydration-Well hydrated. Voice-Normal.  Head and Neck Head-normocephalic, atraumatic with no lesions or palpable masses. Trachea-midline. Thyroid Gland Characteristics - normal size and consistency.  Chest  and Lung Exam Chest and lung exam reveals -quiet, even and easy respiratory effort with no use of accessory muscles and on auscultation, normal breath sounds, no adventitious sounds and normal vocal resonance. Inspection Chest Wall - Normal. Back - normal.  Breast Note: Bruising right breast upper inner quadrant with small hematoma. Nipple normal right breast. Left breast normal without mass lesion nipple discharge.  Cardiovascular Cardiovascular examination reveals -normal heart sounds, regular rate and rhythm with no murmurs and normal pedal pulses bilaterally.  Neurologic Neurologic evaluation reveals -alert and oriented x 3 with no impairment of recent or remote memory. Mental Status-Normal.  Musculoskeletal Normal Exam - Left-Upper Extremity Strength Normal and Lower Extremity Strength Normal. Normal Exam - Right-Upper Extremity Strength Normal and Lower Extremity Strength Normal.  Lymphatic Head & Neck  General Head & Neck Lymphatics: Bilateral - Description - Normal. Axillary  General Axillary Region: Bilateral - Description - Normal. Tenderness - Non Tender.    Assessment & Plan  (Dyrell Tuccillo [name]. Sana Tessmer MD; 06/30/2015 5:01 PM)  BREAST CANCER, RIGHT (C50.911) Impression: Stage I right breast cancer multifocal right breast upper inner quadrant. Triple positive so refer to medical oncology and radiation oncology. She is [name] good lumpectomy candidate without neoadjuvant chemotherapy but may require [name] Port-[name]-Cath for postoperative care. Discussed mastectomy and reconstruction as well. She is very interested in proceeding with right breast lumpectomy and sentinel lymph node mapping. We'll await consultant's input and then schedule surgery after.  Current Plans Referred to Oncology, for evaluation and follow up (Oncology). Routine. Referred to Radiation Oncology, for evaluation and follow up (Radiation Oncology). Routine. Referred to Physical Therapy, for evaluation and follow up (Physical Therapy). Routine. You are being scheduled for surgery - Our schedulers will call you.  You should hear from our office's scheduling department within 5 working days about the location, date, and time of surgery. We try to make accommodations for patient's preferences in scheduling surgery, but sometimes the OR schedule or the surgeon's schedule prevents [place] from making those accommodations.  If you have not heard from our office [phone]) in 5 working days, call the office and ask for your surgeon's nurse.  If you have other questions about your diagnosis, plan, or surgery, call the office and ask for your surgeon's nurse.  Pt Education - [facility] Breast Cancer Information Given - [facility] "Breast Journey" Package We discussed the staging and pathophysiology of breast cancer. We discussed all of the different options for treatment for breast cancer including surgery, chemotherapy, radiation therapy, Herceptin, and antiestrogen therapy. We discussed [name] sentinel lymph node biopsy as she does not appear to having lymph node involvement right now. We discussed the performance of that with injection of  radioactive tracer and blue dye. We discussed that she would have an incision underneath her axillary hairline. We discussed that there is [name] bout [name] 10-20% chance of having [name] positive node with [name] sentinel lymph node biopsy and we will await the permanent pathology to make any other first further decisions in terms of her treatment. One of these options might be to return to the operating room to perform an axillary lymph node dissection. We discussed about [name] 1-2% risk lifetime of chronic shoulder pain as well as lymphedema associated with [name] sentinel lymph node biopsy. We discussed the options for treatment of the breast cancer which included lumpectomy versus [name] mastectomy. We discussed the performance of the lumpectomy with [name] wire placement. We discussed [name] 10-20% chance of [name] positive margin requiring reexcision in the  operating room. We also discussed that she may need radiation therapy or antiestrogen therapy or both if she undergoes lumpectomy. We discussed the mastectomy and the postoperative care for that as well. We discussed that there is no difference in her survival whether she undergoes lumpectomy with radiation therapy or antiestrogen therapy versus [name] mastectomy. There is [name] slight difference in the local recurrence rate being 3-5% with lumpectomy and about 1% with [name] mastectomy. We discussed the risks of operation including bleeding, infection, possible reoperation. She understands her further therapy will be based on what her stages at the time of her operation.  Pt Education - [facility] breast cancer surgery: discussed with patient and provided information. Pt Education - [facility] Breast Biopsy HCI: discussed with patient and provided information. Pt Education - ABC (After Breast Cancer) Class Info: discussed with patient and provided information.

## 2015-07-28 NOTE — Discharge Instructions (Signed)
[facility] °Office Phone Number [phone] ° °BREAST BIOPSY/ PARTIAL MASTECTOMY: POST OP INSTRUCTIONS ° °Always review your discharge instruction sheet given to you by the facility where your surgery was performed. ° °IF YOU HAVE DISABILITY OR FAMILY LEAVE FORMS, YOU MUST BRING THEM TO THE OFFICE FOR PROCESSING.  DO NOT GIVE THEM TO YOUR DOCTOR. ° °1. A prescription for pain medication may be given to you upon discharge.  Take your pain medication as prescribed, if needed.  If narcotic pain medicine is not needed, then you may take acetaminophen (Tylenol) or ibuprofen (Advil) as needed. °2. Take your usually prescribed medications unless otherwise directed °3. If you need a refill on your pain medication, please contact your pharmacy.  They will contact our office to request authorization.  Prescriptions will not be filled after 5pm or on week-ends. °4. You should eat very light the first 24 hours after surgery, such as soup, crackers, pudding, etc.  Resume your normal diet the day after surgery. °5. Most patients will experience some swelling and bruising in the breast.  Ice packs and a good support bra will help.  Swelling and bruising can take several days to resolve.  °6. It is common to experience some constipation if taking pain medication after surgery.  Increasing fluid intake and taking a stool softener will usually help or prevent this problem from occurring.  A mild laxative (Milk of Magnesia or Miralax) should be taken according to package directions if there are no bowel movements after 48 hours. °7. Unless discharge instructions indicate otherwise, you may remove your bandages 24-48 hours after surgery, and you may shower at that time.  You may have steri-strips (small skin tapes) in place directly over the incision.  These strips should be left on the skin for 7-10 days.  If your surgeon used skin glue on the incision, you may shower in 24 hours.  The glue will flake off over the  next 2-3 weeks.  Any sutures or staples will be removed at the office during your follow-up visit. °8. ACTIVITIES:  You may resume regular daily activities (gradually increasing) beginning the next day.  Wearing a good support bra or sports bra minimizes pain and swelling.  You may have sexual intercourse when it is comfortable. °a. You may drive when you no longer are taking prescription pain medication, you can comfortably wear a seatbelt, and you can safely maneuver your car and apply brakes. °b. RETURN TO WORK:  ______________________________________________________________________________________ °9. You should see your doctor in the office for a follow-up appointment approximately two weeks after your surgery.  Your doctor’s nurse will typically make your follow-up appointment when she calls you with your pathology report.  Expect your pathology report 2-3 business days after your surgery.  You may call to check if you do not hear from us after three days. °10. OTHER INSTRUCTIONS: _______________________________________________________________________________________________ _____________________________________________________________________________________________________________________________________ °_____________________________________________________________________________________________________________________________________ °_____________________________________________________________________________________________________________________________________ ° °WHEN TO CALL YOUR DOCTOR: °1. Fever over 101.0 °2. Nausea and/or vomiting. °3. Extreme swelling or bruising. °4. Continued bleeding from incision. °5. Increased pain, redness, or drainage from the incision. ° °The clinic staff is available to answer your questions during regular business hours.  Please don’t hesitate to call and ask to speak to one of the nurses for clinical concerns.  If you have a medical emergency, go to the nearest  emergency room or call 911.  A surgeon from [facility] is always on call at the hospital. ° °For further questions, please visit [email or web address]  ° ° ° ° ° ° °  PORT-A-CATH: POST OP INSTRUCTIONS  Always review your discharge instruction sheet given to you by the facility where your surgery was performed.   1. A prescription for pain medication may be given to you upon discharge. Take your pain medication as prescribed, if needed. If narcotic pain medicine is not needed, then you make take acetaminophen (Tylenol) or ibuprofen (Advil) as needed.  2. Take your usually prescribed medications unless otherwise directed. 3. If you need a refill on your pain medication, please contact our office. All narcotic pain medicine now requires a paper prescription.  Phoned in and fax refills are no longer allowed by law.  Prescriptions will not be filled after 5 pm or on weekends.  4. You should follow a light diet for the remainder of the day after your procedure. 5. Most patients will experience some mild swelling and/or bruising in the area of the incision. It may take several days to resolve. 6. It is common to experience some constipation if taking pain medication after surgery. Increasing fluid intake and taking a stool softener (such as Colace) will usually help or prevent this problem from occurring. A mild laxative (Milk of Magnesia or Miralax) should be taken according to package directions if there are no bowel movements after 48 hours.  7. Unless discharge instructions indicate otherwise, you may remove your bandages 48 hours after surgery, and you may shower at that time. You may have steri-strips (small white skin tapes) in place directly over the incision.  These strips should be left on the skin for 7-10 days.  If your surgeon used Dermabond (skin glue) on the incision, you may shower in 24 hours.  The glue will flake off over the next 2-3 weeks.  8. If your port is left accessed at  the end of surgery (needle left in port), the dressing cannot get wet and should only by changed by a healthcare professional. When the port is no longer accessed (when the needle has been removed), follow step 7.   9. ACTIVITIES:  Limit activity involving your arms for the next 72 hours. Do no strenuous exercise or activity for 1 week. You may drive when you are no longer taking prescription pain medication, you can comfortably wear a seatbelt, and you can maneuver your car. 10.You may need to see your doctor in the office for a follow-up appointment.  Please       check with your doctor.  11.When you receive a new Port-a-Cath, you will get a product guide and        ID card.  Please keep them in case you need them.  WHEN TO CALL YOUR DOCTOR [phone]): 1. Fever over 101.0 2. Chills 3. Continued bleeding from incision 4. Increased redness and tenderness at the site 5. Shortness of breath, difficulty breathing   The clinic staff is available to answer your questions during regular business hours. Please dont hesitate to call and ask to speak to one of the nurses or medical assistants for clinical concerns. If you have a medical emergency, go to the nearest emergency room or call 911.  A surgeon from [facility] is always on call at the hospital.     For further information, please visit [email or web address]       Implanted Port Insertion An implanted port is a central line that has a round shape and is placed under the skin. It is used as a long-term IV access for:   Medicines, such as chemotherapy.   Fluids.  Liquid nutrition, such as total parenteral nutrition (TPN).   Blood samples.  LET [facility] CARE PROVIDER KNOW ABOUT:  Allergies to food or medicine.   Medicines taken, including vitamins, herbs, eye drops, creams, and over-the-counter medicines.   Any allergies to heparin.  Use of steroids (by mouth or creams).   Previous  problems with anesthetics or numbing medicines.   History of bleeding problems or blood clots.   Previous surgery.   Other health problems, including diabetes and kidney problems.   Possibility of pregnancy, if this applies. RISKS AND COMPLICATIONS Generally, this is a safe procedure. However, as with any procedure, problems can occur. Possible problems include:  Damage to the blood vessel, bruising, or bleeding at the puncture site.   Infection.  Blood clot in the vessel that the port is in.  Breakdown of the skin over your port.  Very rarely a person may develop a condition called a pneumothorax, a collection of air in the chest that may cause one of the lungs to collapse. The placement of these catheters with the appropriate imaging guidance significantly decreases the risk of a pneumothorax.  BEFORE THE PROCEDURE   Your health care provider may want you to have blood tests. These tests can help tell how well your kidneys and liver are working. They can also show how well your blood clots.   If you take blood thinners (anticoagulant medicines), ask your health care provider when you should stop taking them.   Make arrangements for someone to drive you home. This is necessary if you have been sedated for your procedure.  PROCEDURE  Port insertion usually takes about 30-45 minutes.   An IV needle will be inserted in your arm. Medicine for pain and medicine to help relax you (sedative) will flow directly into your body through this needle.   You will lie on an exam table, and you will be connected to monitors to keep track of your heart rate, blood pressure, and breathing throughout the procedure.  An oxygen monitoring device may be attached to your finger. Oxygen will be given.   Everything will be kept as germ free (sterile) as possible during the procedure. The skin near the point of the incision will be cleansed with antiseptic, and the area will be draped with  sterile towels. The skin and deeper tissues over the port area will be made numb with a local anesthetic.  Two small cuts (incisions) will be made in the skin to insert the port. One will be made in the neck to obtain access to the vein where the catheter will lie.   Because the port reservoir will be placed under the skin, a small skin incision will be made in the upper chest, and a small pocket for the port will be made under the skin. The catheter that will be connected to the port tunnels to a large central vein in the chest. A small, raised area will remain on your body at the site of the reservoir when the procedure is complete.  The port placement will be done under imaging guidance to ensure the proper placement.  The reservoir has a silicone covering that can be punctured with a special needle.   The port will be flushed with normal saline, and blood will be drawn to make sure it is working properly.  There will be nothing remaining outside the skin when the procedure is finished.   Incisions will be held together by stitches, surgical glue, or a  special tape. AFTER THE PROCEDURE  You will stay in a recovery area until the anesthesia has worn off. Your blood pressure and pulse will be checked.  A final chest X-ray will be taken to check the placement of the port and to ensure that there is no injury to your lung.   This information is not intended to replace advice given to you by your health care provider. Make sure you discuss any questions you have with your health care provider.   Document Released: 05/22/2013 Document Revised: 08/22/2014 Document Reviewed: 05/22/2013 Elsevier Interactive Patient Education 2016 [facility] Instructions  Activity: Get plenty of rest for the remainder of the day. A responsible adult should stay with you for 24 hours following the procedure.  For the next 24 hours, DO NOT: -Drive a car -Conservation officer, nature -Drink alcoholic beverages -Take any medication unless instructed by your physician -Make any legal decisions or sign important papers.  Meals: Start with liquid foods such as gelatin or soup. Progress to regular foods as tolerated. Avoid greasy, spicy, heavy foods. If nausea and/or vomiting occur, drink only clear liquids until the nausea and/or vomiting subsides. Call your physician if vomiting continues.  Special Instructions/Symptoms: Your throat may feel dry or sore from the anesthesia or the breathing tube placed in your throat during surgery. If this causes discomfort, gargle with warm salt water. The discomfort should disappear within 24 hours.  If you had a scopolamine patch placed behind your ear for the management of post- operative nausea and/or vomiting:  1. The medication in the patch is effective for 72 hours, after which it should be removed.  Wrap patch in a tissue and discard in the trash. Wash hands thoroughly with soap and water. 2. You may remove the patch earlier than 72 hours if you experience unpleasant side effects which may include dry mouth, dizziness or visual disturbances. 3. Avoid touching the patch. Wash your hands with soap and water after contact with the patch.

## 2015-07-28 NOTE — Anesthesia Preprocedure Evaluation (Signed)
Anesthesia Evaluation  Patient identified by MRN, date of birth, ID band Patient awake    Reviewed: Allergy & Precautions, NPO status , Patient's Chart, lab work & pertinent test results  Airway Mallampati: II  TM Distance: >3 FB Neck ROM: Full    Dental no notable dental hx.    Pulmonary sleep apnea ,    Pulmonary exam normal breath sounds clear to auscultation       Cardiovascular negative cardio ROS Normal cardiovascular exam Rhythm:Regular Rate:Normal     Neuro/Psych negative neurological ROS  negative psych ROS   GI/Hepatic negative GI ROS, Neg liver ROS, GERD  Medicated,  Endo/Other  negative endocrine ROS  Renal/GU negative Renal ROS  negative genitourinary   Musculoskeletal negative musculoskeletal ROS (+)   Abdominal   Peds negative pediatric ROS (+)  Hematology negative hematology ROS (+)   Anesthesia Other Findings   Reproductive/Obstetrics negative OB ROS                             Anesthesia Physical Anesthesia Plan  ASA: II  Anesthesia Plan: General   Post-op Pain Management: GA combined w/ Regional for post-op pain   Induction: Intravenous  Airway Management Planned: LMA  Additional Equipment:   Intra-op Plan:   Post-operative Plan: Extubation in OR  Informed Consent: I have reviewed the patients History and Physical, chart, labs and discussed the procedure including the risks, benefits and alternatives for the proposed anesthesia with the patient or authorized representative who has indicated his/her understanding and acceptance.   Dental advisory given  Plan Discussed with: CRNA and Surgeon  Anesthesia Plan Comments:         Anesthesia Quick Evaluation

## 2015-07-28 NOTE — Progress Notes (Signed)
Assisted Dr. Rose with left, ultrasound guided, pectoralis block. Side rails up, monitors on throughout procedure. See vital signs in flow sheet. Tolerated Procedure well. 

## 2015-07-28 NOTE — Op Note (Signed)
Preoperative diagnosis: Stage I left breast cancer  Postoperative diagnosis: Same  Procedure: Left breast seed localized lumpectomy with left axillary sentinel node mapping and placement of right internal jugular Port-A-Cath with C-arm and ultrasound guidance  Surgeon: Erroll Luna M.D.  Anesthesia: LMA with pectoral block and 0.25% Sensorcaine local with epinephrine  EBL: Minimal  Specimen: Left breast mass and clip verify the radiograph 3 left axillary sentinel nodes to pathology  Drains: None  Indications for procedure: The patient 54 year old female diagnosed with stage I left breast cancer. She was seen in consultation and options of treatment were discussed with her. We discussed breast conservation and mastectomy and reconstruction. She wishes to conserve her breast. She will need postoperative chemotherapy and the oncologist requested a Port-A-Cath as well.The procedure has been discussed with the patient. Alternatives to surgery have been discussed with the patient.  Risks of surgery include bleeding,  Infection,  Seroma formation, death,  and the need for further surgery.   The patient understands and wishes to proceed.Sentinel lymph node mapping and dissection has been discussed with the patient.  Risk of bleeding,  Infection,  Seroma formation,  Additional procedures,,  Shoulder weakness ,  Shoulder stiffness,  Nerve and blood vessel injury and reaction to the mapping dyes have been discussed.  Alternatives to surgery have been discussed with the patient.  The patient agrees to proceed. Risk of port placement include bleeding, infection, collapsed lung, major blood vessel injury, death, DVT, major nerve injury resulting in dysfunctional upper extruded, catheter migration, catheter infection, bleeding around the lung.  Description of procedure: The patient was met in the holding area and questions are answered. Neoprobe was used to verify located left breast seed. She underwent a  pectoral block per anesthesia. She underwent nuclear medicine injection of left breast with technetium Soffer colloid. She was taken back the operating room placed upon the OR table. After induction of general anesthesia, upper chest regions in both arms and neck were prepped and draped in a sterile fashion. Timeout was done to verify proper patient, side and procedure. The Port-A-Cath was placed first. With the patient Trendelenburg ultrasound was used to locate the right internal jugular vein. It was placed under ultrasound guidance into the right internal liver vein. Wire was fed into this easily down into the superior vena cava and right ventricle. She had no ectopy. C-arm used to verify location of wire in the skin vena cava and right ventricle. Small stab incision made at the wire insertion site. Small pocket was made just below the clavicle after infiltration the skin with 0.25% Sensorcaine local with epinephrine. 8 [place] Port-A-Cath brought field. Was tunneled lower incision to the upper incision and then attached to the hub. Walking device was locked. Catheter cut to 20 cm in length. With the patient Trendelenburg the dilator introducer complex was advanced over the wire moving the wire to and fro without resistance. Once entire complex was in place the wire and dilator removed. Catheter was fed through the peel-away sheath was peeled away without difficulty. C-arm to the tip to be at the junction of the right atrium. Catheter drew back dark nonpulsatile blood and was flushed with heparinized saline. Heparinized saline Coster was in place 5 mL into the catheter. Catheter secured to chest wall with a single stitch of 2-0 Prolene. Incision closed with 3-0 Vicryl and 4-0 Monocryl. Neck incision closed with 4-0 Monocryl. Liquid adhesive applied.  Lumpectomy was then done next the left side. Neoprobe was used with  settings on iodine. Hotspot identified left breast upper-outer quadrant 4 cm from the  nipple complex. Curvilinear incision made over the hotspot dissection was carried down to the area with a clip and seed were located. Grossly negative margins were taken around this. Neoprobe used to verify seed in the specimen. Radiograph taken which show both seating clip to be in the center of the specimen. This was shown the radiology and they concurred specimen was pinned. I took additional anterior skin sensitive tumors quite superficial was anterior margin additional. Cavity is irrigated and found to be hemostatic. Clips are placed a mark cavity and this was closed with 3-0 Vicryl and 4-0 Monocryl subcuticular stitch.  Sentinel node was done next. Neoprobe was used to identify hotspot left axilla. 2 cm incision made was made and dissection was carried down into the left axilla contents. 3 hot so nodes were identified background counts approached 0 after these were removed. Hemostasis was achieved. Surgicel snow place in the cavity. No evidence of injury to the long thoracic nerve and thoracodorsal trunk. Axillary vein was intact. This was closed with 3-0 Vicryl for Monocryl. Adhesive applied all incisions. Breast binder placed. All final counts the sponge, needle and management are found to be correct at this portion the case. Patient was awoke extubated taken to recovery in satisfactory condition.

## 2015-07-28 NOTE — Anesthesia Postprocedure Evaluation (Signed)
Anesthesia Post Note  Patient: Meagan [name]  Procedure(s) Performed: Procedure(s) (LRB): LEFT BREAST LUMPECTOMY WITH RADIOACTIVE SEED AND SENTINEL LYMPH NODE BIOPSY (Left) INSERTION PORT-A-CATH WITH ULTRA SOUND (Right)  Patient location during evaluation: PACU Anesthesia Type: General Level of consciousness: awake and alert Pain management: pain level controlled Vital Signs Assessment: post-procedure vital signs reviewed and stable Respiratory status: spontaneous breathing and respiratory function stable Cardiovascular status: blood pressure returned to baseline and stable Postop Assessment: no signs of nausea or vomiting Anesthetic complications: no    Last Vitals:  Filed Vitals:   07/28/15 1533 07/28/15 1545  BP: 131/84 131/79  Pulse: 87 81  Temp: 36.4 C   Resp: 27 20    Last Pain:  Filed Vitals:   07/28/15 1549  PainSc: 7                  Dartagnan Beavers S

## 2015-07-28 NOTE — Interval H&P Note (Signed)
History and Physical Interval Note:  07/28/2015 1:29 PM  Meagan [name]  has presented today for surgery, with the diagnosis of left breast cancer  The various methods of treatment have been discussed with the patient and family. After consideration of risks, benefits and other options for treatment, the patient has consented to  Procedure(s): LEFT BREAST LUMPECTOMY WITH RADIOACTIVE SEED AND SENTINEL LYMPH NODE BIOPSY (Left) INSERTION PORT-A-CATH WITH ULTRA SOUND (N/A) as a surgical intervention .  The patient's history has been reviewed, patient examined, no change in status, stable for surgery.  I have reviewed the patient's chart and labs.  Questions were answered to the patient's satisfaction.    Discussed Port-A-Cath. Risk of bleeding, infection, pneumothorax, hemothorax, cardiac injury, major blood vessel injury, death, DVT, migration, fragmentation, stroke, other cardiovascular events. Benefits discussed. Patient agrees to proceed.   [name] A.

## 2015-07-28 NOTE — Transfer of Care (Signed)
Immediate Anesthesia Transfer of Care Note  Patient: Meagan [name]  Procedure(s) Performed: Procedure(s): LEFT BREAST LUMPECTOMY WITH RADIOACTIVE SEED AND SENTINEL LYMPH NODE BIOPSY (Left) INSERTION PORT-A-CATH WITH ULTRA SOUND (Right)  Patient Location: PACU  Anesthesia Type:GA combined with regional for post-op pain  Level of Consciousness: awake  Airway & Oxygen Therapy: Patient Spontanous Breathing and Patient connected to face mask oxygen  Post-op Assessment: Report given to RN and Post -op Vital signs reviewed and stable  Post vital signs: Reviewed and stable  Last Vitals:  Filed Vitals:   07/28/15 1350 07/28/15 1533  BP:  131/84  Pulse: 67 87  Temp:    Resp: 20 27    Complications: No apparent anesthesia complications

## 2015-07-28 NOTE — Anesthesia Procedure Notes (Addendum)
Anesthesia Regional Block:  Pectoralis block  Pre-Anesthetic Checklist: ,, timeout performed, Correct Patient, Correct Site, Correct Laterality, Correct Procedure, Correct Position, site marked, Risks and benefits discussed,  Surgical consent,  Pre-op evaluation,  At surgeon's request and post-op pain management  Laterality: Left  Prep: chloraprep       Needles:  Injection technique: Single-shot  Needle Type: Echogenic Needle     Needle Length: 9cm 9 cm Needle Gauge: 21 and 21 G    Additional Needles:  Procedures: ultrasound guided (picture in chart) Pectoralis block Narrative:  Injection made incrementally with aspirations every 5 mL.  Performed by: Personally   Additional Notes: Patient tolerated the procedure well without complications   Procedure Name: LMA Insertion Date/Time: 07/28/2015 1:58 PM Performed by: Melynda Ripple D Pre-anesthesia Checklist: Patient identified, Emergency Drugs available, Suction available and Patient being monitored Patient Re-evaluated:Patient Re-evaluated prior to inductionOxygen Delivery Method: Circle System Utilized Preoxygenation: Pre-oxygenation with 100% oxygen Intubation Type: IV induction Ventilation: Mask ventilation without difficulty LMA: LMA inserted LMA Size: 4.0 Number of attempts: 1 Airway Equipment and Method: Bite block Placement Confirmation: positive ETCO2 Tube secured with: Tape Dental Injury: Teeth and Oropharynx as per pre-operative assessment

## 2015-07-29 ENCOUNTER — Telehealth: Payer: Self-pay | Admitting: *Deleted

## 2015-07-29 ENCOUNTER — Other Ambulatory Visit: Payer: Self-pay | Admitting: *Deleted

## 2015-07-29 ENCOUNTER — Encounter (HOSPITAL_BASED_OUTPATIENT_CLINIC_OR_DEPARTMENT_OTHER): Payer: Self-pay | Admitting: Surgery

## 2015-07-29 NOTE — Telephone Encounter (Signed)
Patient called stating that she would like MD Magrinat to know that she will not proceed with chemotherapy because she "does not feel strong enough to do that." Message sent to MD Magrinat.

## 2015-07-30 ENCOUNTER — Telehealth: Payer: Self-pay | Admitting: *Deleted

## 2015-07-30 NOTE — Telephone Encounter (Signed)
TC from patient this am requesting that "what was put in her body this week needs to come out. I never wanted it"  Pt had PAC inserted this week (07/28/15)  Pt wants to speak with Dr. Jana Hakim ASAP

## 2015-07-30 NOTE — Telephone Encounter (Signed)
This RN called pt to inform her MD would like to see her Monday 12/19- to discuss her concerns as well as options of therapy for best outcome.

## 2015-07-31 ENCOUNTER — Encounter: Payer: Self-pay | Admitting: Family Medicine

## 2015-08-03 ENCOUNTER — Ambulatory Visit (HOSPITAL_BASED_OUTPATIENT_CLINIC_OR_DEPARTMENT_OTHER): Payer: Commercial Managed Care - PPO | Admitting: Oncology

## 2015-08-03 ENCOUNTER — Encounter: Payer: Self-pay | Admitting: Family Medicine

## 2015-08-03 VITALS — BP 118/76 | HR 72 | Temp 97.8°F | Resp 18 | Ht 63.0 in | Wt 129.7 lb

## 2015-08-03 DIAGNOSIS — Z170 Estrogen receptor positive status [ER+]: Secondary | ICD-10-CM | POA: Diagnosis not present

## 2015-08-03 DIAGNOSIS — C50412 Malignant neoplasm of upper-outer quadrant of left female breast: Secondary | ICD-10-CM

## 2015-08-03 NOTE — Progress Notes (Signed)
Meagan [name]  [phone] Fax:([phone]) [phone]     ID: Meagan [name] DOB: 07-May-1961  MR#: [id number]  QPY#:[id number]  Patient Care Team: Tammi Sou, MD as PCP - General (Family Medicine) Erroll Luna, MD as Consulting Physician (General Surgery) Chauncey Cruel, MD as Consulting Physician (Oncology) PCP: Tammi Sou, MD GYN: Marylynn Pearson MD OTHER MD: Thea Silversmith MD  CHIEF COMPLAINT:  Triple positive breast cancer  CURRENT TREATMENT: Awaiting systemic treatment   BREAST CANCER HISTORY: From the original intake note:  Seleni had screening mammography in September 2015 showing left breast calcifications. She was recalled for diagnostic left mammogram 05/06/2014 at the [facility] Center. This found the breast density to be category C. Magnified views found the calcifications to be consistent with "milk of calcium". Accordingly she was set up for routine screening mammography a year later. However, sometime in July 2016 she noted a bump in her left breast. She eventually brought it to the attention of her primary care physician, who felt some areas of concern in the upper outer quadrants of both breasts.  Screening mammography 06/09/2015 was unremarkable. Bilateral diagnostic mammography with tomography at the breast Center 06/11/2015 found a 7 mm benign cyst in the right breast at the 10:00 position. In the left breast there was a hypoechoic oval mass at the 1:00 position 3 cm from the nipple measuring 10 mm. Biopsy of the left breast mass 06/26/2015 showed ([id number]) an invasive ductal carcinoma, E-cadherin positive, grade 2, estrogen receptor 80% positive, progesterone receptor 90% positive, both with strong staining intensity, with an MIB-1 of 5%, and HER-2 amplified, the signals ratio being 2.27 and the number per cell 5.45.  Bilateral breast MRI 07/06/2015 found a 1 cm mass in the upper outer left breast with central clip artifact. Laterally and  inferior to this there was a TRAM track area of enhancement. This was consistent with benign biopsy tract changes. There were no abnormal appearing lymph nodes. There were 2 subcentimeter foci in the liver which were noted to be hepatic cysts in a prior abdominal CT  Her subsequent history is as detailed below.   INTERVAL HISTORY: Meagan [name] returns today for follow-up of her breast cancer accompanied by her significant other Meagan [name] . At the last visit we had planned a neoadjuvant treatment but she changed her mind and underwent left lumpectomy 07/28/2015. This showed ([id number]) and invasive ductal carcinoma, measuring 0.9 cm, grade 2, with all 3 sentinel lymph nodes clear, and negative margins.  she also had a port placed at the same time with the expectation that she would need chemotherapy and anti-HER-2 treatment as previously discussed. She has since changed her mind and now is considering having the port removed.  REVIEW OF SYSTEMS: She did well with the surgery, with some pain but no significant problems otherwise and specifically no bleeding or fever complications. She had a little bit of erythema on the port side and treated that with peroxide. Aside from these issues a detailed review of systems today was noncontributory  PAST MEDICAL HISTORY: Past Medical History  Diagnosis Date  . SAB (spontaneous abortion)   . Hyperlipidemia   . Endometrial polyp   . Endometriosis   . History of iron deficiency anemia   . GERD (gastroesophageal reflux disease)     +Barretts esophagus on endo 09/2012  . Arthritis of hand   . Fibroids   . Helicobacter pylori (H. pylori)     positive serology 07/2012  . Anemia   .  Rosacea   . Shortness of breath     on exertion  . H/O hiatal hernia   . Barrett esophagus   . H. pylori infection   . Uterine fibroid   . Breast cancer, left ([facility]) 06/26/15    1 cm mass, + core bx; invasive ductal carcinoma: suggested tx plan was breast conservation surgery followed by  HER-2 based therapy with accompanying chemotherapy, as well as radiation, followed by anti-estrogens.  However, as of 07/14/15 hem/onc appt pt only agreed to start tamoxifen and pt may be getting a 2nd surgical opinion and possibly second med oncology opinion.  . Sleep apnea     no CPAP    PAST SURGICAL HISTORY: Past Surgical History  Procedure Laterality Date  . Myomectomy abdominal approach    . Polypectomy  2013    ENDOMETRIAL POLYP  . Wisdom tooth extraction    . Hysteroscopy  2010  . Exp. lap. left ovarian cystectomy / lysis adhesions/ myomectomy  2008  . D & c hysteroscopy/ polypectomy/ dx laparoscopy/ lysis adhesions/ ablation endometriosis  04-10-2009  . Hysteroscopy w/d&c  08/25/2011    Procedure: DILATATION AND CURETTAGE /HYSTEROSCOPY;  Surgeon: Marylynn Pearson;  Location: [facility];  Service: Gynecology;;  . Myomectomy    . Esophagogastroduodenoscopy  10/01/12    chronic inactive gastritis (H. pylori neg, no dysplasia or malig),+ Barretts esophagus (Dr. Hilarie Fredrickson)  . Dilatation & curettage/hysteroscopy with trueclear N/A 03/29/2013    Procedure: DILATATION & CURETTAGE/HYSTEROSCOPY WITH TRUECLEAR;  Surgeon: Marylynn Pearson, MD;  Location: [facility];  Service: Gynecology;  Laterality: N/A;  . Breast biopsy  06/29/15    Core needle: + invasive mammary carcinoma  . Dilation and curettage of uterus    . Breast lumpectomy with radioactive seed and sentinel lymph node biopsy Left 07/28/2015    Path: invasive ductal carcinoma, sentinal LN neg for tumor.  Procedure: LEFT BREAST LUMPECTOMY WITH RADIOACTIVE SEED AND SENTINEL LYMPH NODE BIOPSY;  Surgeon: Erroll Luna, MD;  Location: [facility];  Service: General;  Laterality: Left;  . Portacath placement Right 07/28/2015    Procedure: INSERTION PORT-A-CATH WITH ULTRA SOUND;  Surgeon: Erroll Luna, MD;  Location: [facility];  Service: General;  Laterality: Right;    FAMILY HISTORY Family History   Problem Relation Age of Onset  . Prostate cancer Father   . Hypertension Father   . Anuerysm Father   . Heart disease Father   . Uterine cancer Maternal Grandmother   . Heart disease Mother   . Heart disease Maternal Aunt   . Heart disease Maternal Uncle   . Heart disease Paternal Uncle   . Stomach cancer Other   . Diabetes Other     half sister and brother  . Fibroids Sister     uterine  . Esophageal cancer Neg Hx   . Colon cancer Neg Hx    the patient's father died from a brain aneurysm at the age of 31. The patient's mother is living, age 49. The patient has 3 brothers, one sister. The patient's father was diagnosed with prostate cancer shortly before his death. On the mother'side, the grandmother may have had uterine cancer. The patient is not aware of any breast or ovarian cancers in the family  GYNECOLOGIC HISTORY:  Patient's last menstrual period was 07/06/2015. Menarche age 32, the patient is G4 P0. She tells me she had 4 miscarriages felt to be due to fibroids. She had a myomectomy procedure but then was unable  to become pregnant. She still having regular periods. At this point however she feels she is "too old" to have children and if she became menopausal with treatment that would not be a problem  SOCIAL HISTORY:  Jazilyn works for [employer]. She is originally from [place]. She is married but separated. Her significant other, Meagan [name], is [place]. He also works for [employer].    ADVANCED DIRECTIVES: We discussed the fact that if the patient is not legally divorced, her husband, though separated, might claim to be her healthcare power of attorney. Accordingly I gave her the appropriate forms for her to complete and notarize at her discretion.   HEALTH MAINTENANCE: Social History  Substance Use Topics  . Smoking status: Never Smoker   . Smokeless tobacco: Never Used  . Alcohol Use: 0.6 - 1.2 oz/week    1-2 Glasses of wine per week     Comment:  OCCASIONAL     Colonoscopy: 08/13/2013/[facility]  PAP: [facility]  Bone density:  Lipid panel:  No Known Allergies  Current Outpatient Prescriptions  Medication Sig Dispense Refill  . FIBER SELECT GUMMIES CHEW Chew 2 capsules by mouth daily.    . Multiple Vitamin (MULTIVITAMIN WITH MINERALS) TABS tablet Take 1 tablet by mouth daily. *Mixes with all greens and opc 3*    . pantoprazole (PROTONIX) 40 MG tablet TAKE 1 TABLET (40 MG TOTAL) BY MOUTH DAILY 30 tablet 11  . pravastatin (PRAVACHOL) 40 MG tablet Take 1 tablet (40 mg total) by mouth daily. 30 tablet 1  . tamoxifen (NOLVADEX) 20 MG tablet Take 1 tablet (20 mg total) by mouth daily. (Patient not taking: Reported on 07/15/2015) 90 tablet 12   No current facility-administered medications for this visit.    OBJECTIVE: Middle-aged Latin American woman in no acute distress Filed Vitals:   08/03/15 0951  BP: 118/76  Pulse: 72  Temp: 97.8 F (36.6 C)  Resp: 18     Body mass index is 22.98 kg/(m^2).    ECOG FS:1 - Symptomatic but completely ambulatory  Sclerae unicteric, pupils round and equal Oropharynx clear and moist-- no thrush or other lesions No cervical or supraclavicular adenopathy Lungs no rales or rhonchi Heart regular rate and rhythm Abd soft, nontender, positive bowel sounds MSK no focal spinal tenderness, no upper extremity lymphedema Neuro: nonfocal, well oriented, appropriate affect Breasts: The right breast is unremarkable. The left breast is status post recent lumpectomy. The incision is healing nicely, without dehiscence, erythema, or swelling. The overall cosmetic result is very good. The left axilla is benign.  LAB RESULTS:  CMP     Component Value Date/Time   NA 138 07/14/2015 1600   NA 138 05/02/2015 1445   K 4.2 07/14/2015 1600   K 4.1 05/02/2015 1445   CL 105 05/02/2015 1445   CO2 28 07/14/2015 1600   CO2 28 05/02/2015 1445   GLUCOSE 95 07/14/2015 1600   GLUCOSE 92 05/02/2015 1445   BUN 12.2  07/14/2015 1600   BUN 15 05/02/2015 1445   CREATININE 0.9 07/14/2015 1600   CREATININE 0.67 05/02/2015 1445   CALCIUM 9.7 07/14/2015 1600   CALCIUM 9.9 05/02/2015 1445   PROT 7.6 07/14/2015 1600   PROT 7.7 05/02/2015 1445   ALBUMIN 3.8 07/14/2015 1600   ALBUMIN 4.3 05/02/2015 1445   AST 21 07/14/2015 1600   AST 17 05/02/2015 1445   ALT 19 07/14/2015 1600   ALT 13* 05/02/2015 1445   ALKPHOS 55 07/14/2015 1600   ALKPHOS 53  05/02/2015 1445   BILITOT 0.58 07/14/2015 1600   BILITOT 0.7 05/02/2015 1445   GFRNONAA >60 05/02/2015 1445   GFRAA >60 05/02/2015 1445    INo results found for: SPEP, UPEP  Lab Results  Component Value Date   WBC 5.4 07/14/2015   NEUTROABS 3.2 07/14/2015   HGB 12.1 07/14/2015   HCT 36.8 07/14/2015   MCV 83.8 07/14/2015   PLT 228 07/14/2015      Chemistry      Component Value Date/Time   NA 138 07/14/2015 1600   NA 138 05/02/2015 1445   K 4.2 07/14/2015 1600   K 4.1 05/02/2015 1445   CL 105 05/02/2015 1445   CO2 28 07/14/2015 1600   CO2 28 05/02/2015 1445   BUN 12.2 07/14/2015 1600   BUN 15 05/02/2015 1445   CREATININE 0.9 07/14/2015 1600   CREATININE 0.67 05/02/2015 1445      Component Value Date/Time   CALCIUM 9.7 07/14/2015 1600   CALCIUM 9.9 05/02/2015 1445   ALKPHOS 55 07/14/2015 1600   ALKPHOS 53 05/02/2015 1445   AST 21 07/14/2015 1600   AST 17 05/02/2015 1445   ALT 19 07/14/2015 1600   ALT 13* 05/02/2015 1445   BILITOT 0.58 07/14/2015 1600   BILITOT 0.7 05/02/2015 1445       No results found for: LABCA2  No components found for: LABCA125  No results for input(s): INR in the last 168 hours.  Urinalysis    Component Value Date/Time   COLORURINE YELLOW 05/02/2015 1455   APPEARANCEUR CLEAR 05/02/2015 1455   LABSPEC 1.030 05/02/2015 1455   PHURINE 5.5 05/02/2015 1455   GLUCOSEU NEGATIVE 05/02/2015 1455   HGBUR NEGATIVE 05/02/2015 1455   BILIRUBINUR NEGATIVE 05/02/2015 1455   KETONESUR NEGATIVE 05/02/2015 1455    PROTEINUR NEGATIVE 05/02/2015 1455   UROBILINOGEN 0.2 05/02/2015 1455   NITRITE NEGATIVE 05/02/2015 1455   LEUKOCYTESUR NEGATIVE 05/02/2015 1455    STUDIES: Mr Breast Bilateral W Wo Contrast  07/06/2015  CLINICAL DATA:  Recent diagnosis of left breast cancer following biopsy of a palpable approximately 1 cm nodule in the upper-outer quadrant of the left breast. The biopsy was performed with a lateral to medial approach. LABS:  None performed EXAM: BILATERAL BREAST MRI WITH AND WITHOUT CONTRAST TECHNIQUE: Multiplanar, multisequence MR images of both breasts were obtained prior to and following the intravenous administration of 12 ml of MultiHance. THREE-DIMENSIONAL MR IMAGE RENDERING ON INDEPENDENT WORKSTATION: Three-dimensional MR images were rendered by post-processing of the original MR data on an independent workstation. The three-dimensional MR images were interpreted, and findings are reported in the following complete MRI report for this study. Three dimensional images were evaluated at the independent DynaCad workstation COMPARISON:  Recent bilateral breast imaging at the [facility]. Prior CT abdomen and pelvis dated 05/02/2015. FINDINGS: Breast composition: c. Heterogeneous fibroglandular tissue. Background parenchymal enhancement: Mild Right breast: No mass or abnormal enhancement. Left breast: In the anterior third of the upper outer left breast is an irregular 1.0 x 0.7 x 0.8 cm enhancing mass with plateau kinetics and central biopsy clip artifact. This corresponds to the biopsy-proven malignancy. Lateral and slightly inferior to the mass is a curvilinear area of tram track enhancement that extends to the skin surface, and has some susceptibility artifact centrally, consistent with benign biopsy tract changes. No additional suspicious areas of enhancement are identified in the left breast. Lymph nodes: No abnormal appearing lymph nodes. Ancillary findings: Two sub-cm T2  bright foci in  the liver correspond to hepatic cysts seen on prior abdominal CT. IMPRESSION: 1.0 x 0.7 x 0.8 cm biopsy-proven invasive mammary carcinoma of the left breast, anterior third of the upper outer quadrant. Benign biopsy changes are seen lateral to the biopsy-proven malignancy in the left breast. No evidence of malignancy in the right breast. RECOMMENDATION: Treatment planning of the left breast. BI-RADS CATEGORY  6: Known biopsy-proven malignancy. Electronically Signed   By: Britta Mccreedy M.D.   On: 07/06/2015 15:52   Nm Sentinel Node Inj-no Rpt (breast)  07/28/2015  CLINICAL DATA: LEFT BREAST CANCER Sulfur colloid was injected intradermally by the nuclear medicine technologist for breast cancer sentinel node localization.   Mm Breast Surgical Specimen  07/28/2015  CLINICAL DATA:  Recently diagnosed left breast invasive mammary carcinoma and mammary carcinoma in situ. EXAM: SPECIMEN RADIOGRAPH OF THE LEFT BREAST COMPARISON:  Previous exam(s). FINDINGS: Status post excision of the left breast. The radioactive seed and biopsy marker clip are present, completely intact, and were marked for pathology. IMPRESSION: Specimen radiograph of the left breast. Electronically Signed   By: Beckie Salts M.D.   On: 07/28/2015 15:11   Dg Chest Portable 1 View  07/28/2015  CLINICAL DATA:  Status post port placement EXAM: PORTABLE CHEST - 1 VIEW COMPARISON:  None FINDINGS: Cardiac shadow is within normal limits. A right-sided chest wall port is seen with the catheter tip at the cavoatrial junction. No pneumothorax is seen. Postsurgical changes in the left breast are noted. IMPRESSION: No pneumothorax following port placement Electronically Signed   By: Alcide Clever M.D.   On: 07/28/2015 15:54   Dg Fluoro Guide Cv Line-no Report  07/28/2015  CLINICAL DATA:  FLOURO GUIDE CV LINE Fluoroscopy was utilized by the requesting physician.  No radiographic interpretation.   Mm Lt Radioactive Seed Loc Mammo  Guide  07/27/2015  CLINICAL DATA:  Recent diagnosis of left breast cancer following ultrasound-guided biopsy of a palpable mass at 1 o'clock position left breast. The patient and her husband showed me today an area of concern in the patient's lateral left breast and upper left abdomen. The patient developed a tender palpable linear cord extending vertically from the upper-outer left breast to the anterior left abdomen. This has occurred since her left breast biopsy. She says that the tenderness has decreased since she first noticed it. There is no associated erythema. I palpated this long linear and superficial cord today in the lateral left breast and left anterior abdominal wall. With the patient's left arm raised there is an associated linear skin indentation in the lateral left breast. Physical exam findings are consistent with superficial thrombophlebitis. There is no significant erythema. Findings were discussed by telephone with Dr. Luisa Hart prior to proceeding with the seed localization today. EXAM: MAMMOGRAPHIC GUIDED RADIOACTIVE SEED LOCALIZATION OF THE LEFT BREAST COMPARISON:  Previous exam(s). FINDINGS: Patient presents for radioactive seed localization prior to lumpectomy. I met with the patient and we discussed the procedure of seed localization including benefits and alternatives. We discussed the high likelihood of a successful procedure. We discussed the risks of the procedure including infection, bleeding, tissue injury and further surgery. We discussed the low dose of radioactivity involved in the procedure. Informed, written consent was given. The usual time-out protocol was performed immediately prior to the procedure. Using mammographic guidance, sterile technique, 2% lidocaine and an I-125 radioactive seed, a ribbon shaped biopsy clip and associated mass or localized using a superior to inferior approach. The follow-up mammogram images confirm the seed in  the expected location and were marked  for Dr. Brantley Stage. Follow-up survey of the patient confirms presence of the radioactive seed. Order number of I-125 seed:  [id number]. Total activity:  8.295 millicuries  Reference Date: 24 July 2015 The patient tolerated the procedure well and was released from the [facility]. She was given instructions regarding seed removal. IMPRESSION: Radioactive seed localization left breast. No apparent complications. Electronically Signed   By: Curlene Dolphin M.D.   On: 07/27/2015 15:35    ASSESSMENT: 54 y.o. [facility] woman status post left breast biopsy 06/26/2015 for a clinical T1b N0, stage IA invasive ductal carcinoma, grade 2, estrogen and progesterone receptor positive, HER-2 amplified, with a low MIB-1  (1) status post left lumpectomy and sentinel lymph node sampling 07/28/2015 for a  pT1b pN0, stage IA invasive ductal carcinoma, with negative margins.   (2) antiestrogen immunotherapy with chemotherapy to follow  (3) adjuvant radiation to follow chemotherapy  (4) anti-estrogens to follow radiation  PLAN: I spent a little over 40 minutes today with Meagan [name] and Meagan [name] going over her situation in detail. We discussed her pathology report. This shows a small node-negative cancer, which is very favorable.  The fly in the ointment is the fact that this cancer is HER-2 positive. This increases the risk of metastasis and makes the cancer more aggressive. She understands that if she wants to "*of" the cancer cells she will need to not only use anti-estrogens but also anti-HER-2 immunotherapy.  A further problem is that we don't have data in the adjuvant setting for using Herceptin without chemotherapy. We had previously discussed using Taxol or Abraxane weekly 12 and that had been the plan when she went for her surgery. This is the reason why she had a port placed. However she has pretty much made up her mind that she does not want chemotherapy under any circumstances.  We do have data on the stage IV  setting combining anti-estrogens with anti-HER-2 treatments. In this setting as in the adjuvant setting anti-HER-2 treatment essentially cuts in half the risk of recurrence above and beyond the effect of other treatments.  Accordingly if she does not want to have chemotherapy I still would urge her strongly to take anti-HER-2 treatment. Today we discussed the possible toxicities, side effects and complications of trastuzumab. I have set her up for an echocardiogram. I'm setting her up for the first trastuzumab dose on January 9, in the hopes that if she tolerates it well she will be agreeable to continuing for a year as is the standard.  She understands she can go ahead and have her hysterectomy if she wishes and she can also start her radiation treatments while undergoing treatments with Herceptin. We would not start the tamoxifen however until she completes her radiation treatments.  At this point she says she wants the port removed. I have urged her to wait on that decision until she gets one or 2 treatments under her belt. It is I think much more uncomfortable to be accessed peripherally every time as contrasted to just using the port with embolectomy. However if she really wants to have the port removed after she understands it better and has experienced at least one treatment, of course we will go ahead and do that for her.  She is still interested in alternative treatments instead of the standard of care. I have explained that I am not against alternative treatment but I do not have any data that they work I have asked her  to find that data if she wishes to try those. We can then discuss that in more detail. In the meantime I am hoping she will be agreeable to receive Herceptin on January 9 as planned.  She knows to call for any problems that may develop before her next visit here. Chauncey Cruel, MD   08/03/2015 10:03 AM Medical Oncology and Hematology [facility] [address] [place], [place] [address] Tel. [phone]    Fax. [phone]

## 2015-08-04 ENCOUNTER — Telehealth: Payer: Self-pay

## 2015-08-04 NOTE — Telephone Encounter (Signed)
Pt called asking when echo is going to be. Told her CV schedules that and it is not done yet. Gave pt number for CV.

## 2015-08-05 ENCOUNTER — Telehealth: Payer: Self-pay | Admitting: Oncology

## 2015-08-05 ENCOUNTER — Ambulatory Visit (HOSPITAL_COMMUNITY)
Admission: RE | Admit: 2015-08-05 | Discharge: 2015-08-05 | Disposition: A | Discharge status: Home / Self Care | Payer: Commercial Managed Care - PPO | Source: Ambulatory Visit | Attending: Oncology | Admitting: Oncology

## 2015-08-05 DIAGNOSIS — E785 Hyperlipidemia, unspecified: Secondary | ICD-10-CM | POA: Diagnosis not present

## 2015-08-05 DIAGNOSIS — C50412 Malignant neoplasm of upper-outer quadrant of left female breast: Secondary | ICD-10-CM | POA: Diagnosis present

## 2015-08-05 NOTE — Progress Notes (Signed)
  Echocardiogram 2D Echocardiogram has been performed.  Melessa Cowell 08/05/2015, 11:02 AM

## 2015-08-05 NOTE — Telephone Encounter (Signed)
Patient appointments made and placed a note for patient to get a new schedule at echo appointment today

## 2015-08-11 ENCOUNTER — Telehealth: Payer: Self-pay | Admitting: Oncology

## 2015-08-11 ENCOUNTER — Other Ambulatory Visit: Payer: Self-pay | Admitting: Nurse Practitioner

## 2015-08-11 ENCOUNTER — Telehealth: Payer: Self-pay | Admitting: *Deleted

## 2015-08-11 ENCOUNTER — Other Ambulatory Visit: Payer: Self-pay | Admitting: *Deleted

## 2015-08-11 NOTE — Telephone Encounter (Signed)
Per pof and per natro-verbal patient is aware of 08/19/15 with dr Jana Hakim

## 2015-08-11 NOTE — Telephone Encounter (Signed)
Pt called earlier today stating that she has changed her mind and does not want chemo or  Herceptin. Pt states that she also wants Dr. Jana Hakim to authorize her port to be removed by Dr. Brantley Stage simply because she "does not want anything in her body". She made clear that she wants no chemicals of any kind in her body as well. I have scheduled for the pt to see Dr. Jana Hakim on 08/19/15 @ 9:00a to discuss this matter. I have made pt aware of this appt. Message to be fwd to [employer]

## 2015-08-19 ENCOUNTER — Ambulatory Visit: Payer: Self-pay | Admitting: Surgery

## 2015-08-19 ENCOUNTER — Encounter: Payer: Self-pay | Admitting: Family Medicine

## 2015-08-19 ENCOUNTER — Ambulatory Visit (HOSPITAL_BASED_OUTPATIENT_CLINIC_OR_DEPARTMENT_OTHER): Payer: Commercial Managed Care - PPO | Admitting: Oncology

## 2015-08-19 VITALS — BP 105/78 | HR 67 | Temp 97.7°F | Resp 20 | Ht 63.0 in | Wt 130.1 lb

## 2015-08-19 DIAGNOSIS — R51 Headache: Secondary | ICD-10-CM | POA: Diagnosis not present

## 2015-08-19 DIAGNOSIS — C50412 Malignant neoplasm of upper-outer quadrant of left female breast: Secondary | ICD-10-CM

## 2015-08-19 DIAGNOSIS — Z170 Estrogen receptor positive status [ER+]: Secondary | ICD-10-CM

## 2015-08-19 NOTE — Addendum Note (Signed)
Addended by: Laureen Abrahams on: 08/19/2015 04:46 PM   Modules accepted: Medications

## 2015-08-19 NOTE — Progress Notes (Signed)
[facility]  [phone] [phone]     ID: Meagan [name] DOB: 1961-01-14  MR#: [id number]  YKZ#:[id number]  Patient Care Team: Meagan Sou, MD as PCP - General (Family Medicine) Meagan Luna, MD as Consulting Physician (General Surgery) Chauncey Cruel, MD as Consulting Physician (Oncology) PCP: Meagan Sou, MD GYN: Meagan Pearson MD OTHER MD: Meagan Silversmith MD  CHIEF COMPLAINT:  Triple positive breast cancer  CURRENT TREATMENT: Observation   BREAST CANCER HISTORY: From the original intake note:  [name] had screening mammography in September 2015 showing left breast calcifications. She was recalled for diagnostic left mammogram 05/06/2014 at the breast Center. This found the breast density to be category C. Magnified views found the calcifications to be consistent with "milk of calcium". Accordingly she was set up for routine screening mammography a year later. However, sometime in July 2016 she noted a bump in her left breast. She eventually brought it to the attention of her primary care physician, who felt some areas of concern in the upper outer quadrants of both breasts.  Screening mammography 06/09/2015 was unremarkable. Bilateral diagnostic mammography with tomography at the breast Center 06/11/2015 found a 7 mm benign cyst in the right breast at the 10:00 position. In the left breast there was a hypoechoic oval mass at the 1:00 position 3 cm from the nipple measuring 10 mm. Biopsy of the left breast mass 06/26/2015 showed ([id number]) an invasive ductal carcinoma, E-cadherin positive, grade 2, estrogen receptor 80% positive, progesterone receptor 90% positive, both with strong staining intensity, with an MIB-1 of 5%, and HER-2 amplified, the signals ratio being 2.27 and the number per cell 5.45.  Bilateral breast MRI 07/06/2015 found a 1 cm mass in the upper outer left breast with central clip artifact. Laterally and inferior to this  there was a TRAM track area of enhancement. This was consistent with benign biopsy tract changes. There were no abnormal appearing lymph nodes. There were 2 subcentimeter foci in the liver which were noted to be hepatic cysts in a prior abdominal CT  Her subsequent history is as detailed below.   INTERVAL HISTORY: [name] returns today for follow-up of her stage I breast cancer accompanied by her significant other Meagan [name] . Since the last visit here she has been calling Dr. Josetta Huddle office to have her port removed. He wanted her to see me one more time before making that decision and that is why she is here today.  We did review her echo results which are very favorable. I also gave her a copy of her pathology report from her surgery last month.  REVIEW OF SYSTEMS: [name] still has some discomfort relating to the surgery but that is almost completely resolved. The port is also less uncomfortable. It is not associated with any erythema, swelling, or local pain. She has been having mild headaches, which are not persistent, and which she has not been treated with any analgesics. They resolve on their own. She has mild nausea at times. She has been taking a lot of green tea for that. She has minimal scleral irritation, possibly from cosmetics. A detailed review of systems today was otherwise stable  PAST MEDICAL HISTORY: Past Medical History  Diagnosis Date  . SAB (spontaneous abortion)   . Hyperlipidemia   . Endometrial polyp   . Endometriosis   . History of iron deficiency anemia   . GERD (gastroesophageal reflux disease)     +Barretts esophagus on endo 09/2012  . Arthritis  of hand   . Fibroids   . Helicobacter pylori (H. pylori)     positive serology 07/2012  . Anemia   . Rosacea   . Shortness of breath     on exertion  . H/O hiatal hernia   . Barrett esophagus   . H. pylori infection   . Uterine fibroid   . Breast cancer, left ([facility]) 06/26/15    1 cm mass, + core bx; invasive ductal  carcinoma: suggested tx plan was breast conservation surgery followed by HER-2 based therapy with accompanying chemotherapy, as well as radiation, followed by anti-estrogens.  However, as of 08/03/15 hem/onc f/u she has tentatively agreed only to Herceptin, no subsequent chemo.  . Sleep apnea     no CPAP    PAST SURGICAL HISTORY: Past Surgical History  Procedure Laterality Date  . Myomectomy abdominal approach    . Polypectomy  2013    ENDOMETRIAL POLYP  . Wisdom tooth extraction    . Hysteroscopy  2010  . Exp. lap. left ovarian cystectomy / lysis adhesions/ myomectomy  2008  . D & c hysteroscopy/ polypectomy/ dx laparoscopy/ lysis adhesions/ ablation endometriosis  04-10-2009  . Hysteroscopy w/d&c  08/25/2011    Procedure: DILATATION AND CURETTAGE /HYSTEROSCOPY;  Surgeon: Meagan [name];  Location: [facility];  Service: Gynecology;;  . Myomectomy    . Esophagogastroduodenoscopy  10/01/12    chronic inactive gastritis (H. pylori neg, no dysplasia or malig),+ Barretts esophagus (Dr. Hilarie Fredrickson)  . Dilatation & curettage/hysteroscopy with trueclear N/A 03/29/2013    Procedure: DILATATION & CURETTAGE/HYSTEROSCOPY WITH TRUECLEAR;  Surgeon: Meagan Pearson, MD;  Location: [facility];  Service: Gynecology;  Laterality: N/A;  . Breast biopsy  06/29/15    Core needle: + invasive mammary carcinoma  . Dilation and curettage of uterus    . Breast lumpectomy with radioactive seed and sentinel lymph node biopsy Left 07/28/2015    Path: invasive ductal carcinoma, neg margins, sentinal LN x 3 neg for tumor.  Procedure: LEFT BREAST LUMPECTOMY WITH RADIOACTIVE SEED AND SENTINEL LYMPH NODE BIOPSY;  Surgeon: Meagan Luna, MD;  Location: [facility];  Service: General;  Laterality: Left;  . Portacath placement Right 07/28/2015    Procedure: INSERTION PORT-A-CATH WITH ULTRA SOUND;  Surgeon: Meagan Luna, MD;  Location: [facility];  Service: General;  Laterality: Right;      FAMILY HISTORY Family History  Problem Relation Age of Onset  . Prostate cancer Father   . Hypertension Father   . Anuerysm Father   . Heart disease Father   . Uterine cancer Maternal Grandmother   . Heart disease Mother   . Heart disease Maternal Aunt   . Heart disease Maternal Uncle   . Heart disease Paternal Uncle   . Stomach cancer Other   . Diabetes Other     half sister and brother  . Fibroids Sister     uterine  . Esophageal cancer Neg Hx   . Colon cancer Neg Hx    the patient's father died from a brain aneurysm at the age of 56. The patient's mother is living, age 43. The patient has 3 brothers, one sister. The patient's father was diagnosed with prostate cancer shortly before his death. On the mother'side, the grandmother may have had uterine cancer. The patient is not aware of any breast or ovarian cancers in the family  GYNECOLOGIC HISTORY:  No LMP recorded. Menarche age 54, the patient is G4 P0. She tells me she had  4 miscarriages felt to be due to fibroids. She had a myomectomy procedure but then was unable to become pregnant. She still having regular periods. At this point however she feels she is "too old" to have children and if she became menopausal with treatment that would not be a problem  SOCIAL HISTORY:  Alleya works for [employer]. She is originally from [place]. She is married but separated. Her significant other, Meagan [name], is [place]. He also works for [employer].    ADVANCED DIRECTIVES: We discussed the fact that if the patient is not legally divorced, her husband, though separated, might claim to be her healthcare power of attorney. Accordingly I gave her the appropriate forms for her to complete and notarize at her discretion.   HEALTH MAINTENANCE: Social History  Substance Use Topics  . Smoking status: Never Smoker   . Smokeless tobacco: Never Used  . Alcohol Use: 0.6 - 1.2 oz/week    1-2 Glasses of wine per week     Comment:  OCCASIONAL     Colonoscopy: 08/13/2013/[facility]  PAP: [facility]  Bone density:  Lipid panel:  No Known Allergies  Current Outpatient Prescriptions  Medication Sig Dispense Refill  . FIBER SELECT GUMMIES CHEW Chew 2 capsules by mouth daily.    . Multiple Vitamin (MULTIVITAMIN WITH MINERALS) TABS tablet Take 1 tablet by mouth daily. *Mixes with all greens and opc 3*    . pantoprazole (PROTONIX) 40 MG tablet TAKE 1 TABLET (40 MG TOTAL) BY MOUTH DAILY 30 tablet 11  . pravastatin (PRAVACHOL) 40 MG tablet Take 1 tablet (40 mg total) by mouth daily. 30 tablet 1   No current facility-administered medications for this visit.    OBJECTIVE: Middle-aged Latin American woman who appears well  Filed Vitals:   08/19/15 0904  BP: 105/78  Pulse: 67  Temp: 97.7 F (36.5 C)  Resp: 20     Body mass index is 23.05 kg/(m^2).    ECOG FS:1 - Symptomatic but completely ambulatory  Sclerae unicteric, pupils round and equal Oropharynx clear and moist-- no thrush or other lesions No cervical or supraclavicular adenopathy Lungs no rales or rhonchi Heart regular rate and rhythm Abd soft, nontender, positive bowel sounds MSK no focal spinal tenderness, no upper extremity lymphedema Neuro: nonfocal, well oriented, appropriate affect Breasts: Deferred   LAB RESULTS:  CMP     Component Value Date/Time   NA 138 07/14/2015 1600   NA 138 05/02/2015 1445   K 4.2 07/14/2015 1600   K 4.1 05/02/2015 1445   CL 105 05/02/2015 1445   CO2 28 07/14/2015 1600   CO2 28 05/02/2015 1445   GLUCOSE 95 07/14/2015 1600   GLUCOSE 92 05/02/2015 1445   BUN 12.2 07/14/2015 1600   BUN 15 05/02/2015 1445   CREATININE 0.9 07/14/2015 1600   CREATININE 0.67 05/02/2015 1445   CALCIUM 9.7 07/14/2015 1600   CALCIUM 9.9 05/02/2015 1445   PROT 7.6 07/14/2015 1600   PROT 7.7 05/02/2015 1445   ALBUMIN 3.8 07/14/2015 1600   ALBUMIN 4.3 05/02/2015 1445   AST 21 07/14/2015 1600   AST 17 05/02/2015 1445   ALT 19 07/14/2015 1600    ALT 13* 05/02/2015 1445   ALKPHOS 55 07/14/2015 1600   ALKPHOS 53 05/02/2015 1445   BILITOT 0.58 07/14/2015 1600   BILITOT 0.7 05/02/2015 1445   GFRNONAA >60 05/02/2015 1445   GFRAA >60 05/02/2015 1445    INo results found for: SPEP, UPEP  Lab Results  Component Value  Date   WBC 5.4 07/14/2015   NEUTROABS 3.2 07/14/2015   HGB 12.1 07/14/2015   HCT 36.8 07/14/2015   MCV 83.8 07/14/2015   PLT 228 07/14/2015      Chemistry      Component Value Date/Time   NA 138 07/14/2015 1600   NA 138 05/02/2015 1445   K 4.2 07/14/2015 1600   K 4.1 05/02/2015 1445   CL 105 05/02/2015 1445   CO2 28 07/14/2015 1600   CO2 28 05/02/2015 1445   BUN 12.2 07/14/2015 1600   BUN 15 05/02/2015 1445   CREATININE 0.9 07/14/2015 1600   CREATININE 0.67 05/02/2015 1445      Component Value Date/Time   CALCIUM 9.7 07/14/2015 1600   CALCIUM 9.9 05/02/2015 1445   ALKPHOS 55 07/14/2015 1600   ALKPHOS 53 05/02/2015 1445   AST 21 07/14/2015 1600   AST 17 05/02/2015 1445   ALT 19 07/14/2015 1600   ALT 13* 05/02/2015 1445   BILITOT 0.58 07/14/2015 1600   BILITOT 0.7 05/02/2015 1445       No results found for: LABCA2  No components found for: LABCA125  No results for input(s): INR in the last 168 hours.  Urinalysis    Component Value Date/Time   COLORURINE YELLOW 05/02/2015 1455   APPEARANCEUR CLEAR 05/02/2015 1455   LABSPEC 1.030 05/02/2015 1455   PHURINE 5.5 05/02/2015 1455   GLUCOSEU NEGATIVE 05/02/2015 1455   HGBUR NEGATIVE 05/02/2015 1455   BILIRUBINUR NEGATIVE 05/02/2015 1455   KETONESUR NEGATIVE 05/02/2015 1455   PROTEINUR NEGATIVE 05/02/2015 1455   UROBILINOGEN 0.2 05/02/2015 1455   NITRITE NEGATIVE 05/02/2015 1455   LEUKOCYTESUR NEGATIVE 05/02/2015 1455    STUDIES: Nm Sentinel Node Inj-no Rpt (breast)  07/28/2015  CLINICAL DATA: LEFT BREAST CANCER Sulfur colloid was injected intradermally by the nuclear medicine technologist for breast cancer sentinel node localization.    Mm Breast Surgical Specimen  07/28/2015  CLINICAL DATA:  Recently diagnosed left breast invasive mammary carcinoma and mammary carcinoma in situ. EXAM: SPECIMEN RADIOGRAPH OF THE LEFT BREAST COMPARISON:  Previous exam(s). FINDINGS: Status post excision of the left breast. The radioactive seed and biopsy marker clip are present, completely intact, and were marked for pathology. IMPRESSION: Specimen radiograph of the left breast. Electronically Signed   By: Claudie Revering M.D.   On: 07/28/2015 15:11   Dg Chest Portable 1 View  07/28/2015  CLINICAL DATA:  Status post port placement EXAM: PORTABLE CHEST - 1 VIEW COMPARISON:  None FINDINGS: Cardiac shadow is within normal limits. A right-sided chest wall port is seen with the catheter tip at the cavoatrial junction. No pneumothorax is seen. Postsurgical changes in the left breast are noted. IMPRESSION: No pneumothorax following port placement Electronically Signed   By: Inez Catalina M.D.   On: 07/28/2015 15:54   Dg Fluoro Guide Cv Line-no Report  07/28/2015  CLINICAL DATA:  FLOURO GUIDE CV LINE Fluoroscopy was utilized by the requesting physician.  No radiographic interpretation.   Mm Lt Radioactive Seed Loc Mammo Guide  07/27/2015  CLINICAL DATA:  Recent diagnosis of left breast cancer following ultrasound-guided biopsy of a palpable mass at 1 o'clock position left breast. The patient and her husband showed me today an area of concern in the patient's lateral left breast and upper left abdomen. The patient developed a tender palpable linear cord extending vertically from the upper-outer left breast to the anterior left abdomen. This has occurred since her left breast biopsy. She says that the tenderness  has decreased since she first noticed it. There is no associated erythema. I palpated this long linear and superficial cord today in the lateral left breast and left anterior abdominal wall. With the patient's left arm raised there is an associated linear  skin indentation in the lateral left breast. Physical exam findings are consistent with superficial thrombophlebitis. There is no significant erythema. Findings were discussed by telephone with Dr. Brantley Stage prior to proceeding with the seed localization today. EXAM: MAMMOGRAPHIC GUIDED RADIOACTIVE SEED LOCALIZATION OF THE LEFT BREAST COMPARISON:  Previous exam(s). FINDINGS: Patient presents for radioactive seed localization prior to lumpectomy. I met with the patient and we discussed the procedure of seed localization including benefits and alternatives. We discussed the high likelihood of a successful procedure. We discussed the risks of the procedure including infection, bleeding, tissue injury and further surgery. We discussed the low dose of radioactivity involved in the procedure. Informed, written consent was given. The usual time-out protocol was performed immediately prior to the procedure. Using mammographic guidance, sterile technique, 2% lidocaine and an I-125 radioactive seed, a ribbon shaped biopsy clip and associated mass or localized using a superior to inferior approach. The follow-up mammogram images confirm the seed in the expected location and were marked for Dr. Brantley Stage. Follow-up survey of the patient confirms presence of the radioactive seed. Order number of I-125 seed:  [id number]. Total activity:  8.016 millicuries  Reference Date: 24 July 2015 The patient tolerated the procedure well and was released from the [facility]. She was given instructions regarding seed removal. IMPRESSION: Radioactive seed localization left breast. No apparent complications. Electronically Signed   By: Curlene Dolphin M.D.   On: 07/27/2015 15:35    ASSESSMENT: 55 y.o. [facility] woman status post left breast biopsy 06/26/2015 for a clinical T1b N0, stage IA invasive ductal carcinoma, grade 2, estrogen and progesterone receptor positive, HER-2 amplified, with a low MIB-1  (1) status post left lumpectomy and  sentinel lymph node sampling 07/28/2015 for a  pT1b pN0, stage IA invasive ductal carcinoma, with negative margins.   (2) antiestrogens and  immunotherapy with chemotherapy to follow  (3) adjuvant radiation to follow chemotherapy  (4) anti-estrogens to follow radiation  PLAN I spent a little over 40 minutes today with Blanch Media and Meagan [name] going over her situation. She is very adamant that she wants the port out. She refuses chemotherapy. She refuses anti-HER-2 treatment. She does not want to take an antiestrogen pills.  I quoted her a risk of metastatic recurrence in the 24% range with no further treatment. She understands we can drop this to about 5% if we do standard therapy with Taxol, Herceptin, per Jed, and antiestrogen pills as well as radiation.  She has been reading the Internet and has not sorted out all the side effects from the various treatments so we did that today. She understands that Herceptin does not cause nausea, vomiting, hair loss, or low blood counts. She understands that tamoxifen does not cause weakening of the bones or arthralgias/myalgias.  After all that however she still does not want any of the treatments. I did offer to treat her with anti-HER-2 treatment and anastrozole or either of those alone. She refuses. She also is not interested in proceeding to radiation. She really wants is to have her port removed as soon as possible.  We discussed the fact that we do not have the type of study that she would like [place] to have namely a screening test that would tell us if  she has very early recurrence which would allow her perhaps to be cured. Most likely this cancer recurs systemically it will not be curable. I discouraged routine screening CT scans or PET scans, but we agreed that if she does develop suspicious symptoms we will obtain scans and worked those symptoms up aggressively.  She is interested in seeking help with alternative therapy and I am referring her to the [facility]  intake or data of Institute for further information in that regard. She understands I have no data that there is any other treatment aside from the standard treatments that we offer that has valid records showing measurable and reproducible response rates for breast cancer.  I have asked Dr. Brantley Stage to remove her port. For her mild headaches I suggested she try loratadine. If she continues to have headaches she will let me know and we will obtain a brain MRI, but at this point I think she has some sinus symptoms primarily  I did let her know that if she ever wishes more information or changes her mind regarding treatment we are available. Otherwise she will return to see me in 3 months.   :Chauncey Cruel, MD   08/19/2015 9:19 AM Medical Oncology and Hematology [facility] [place], [place] [address] Tel. [phone]    Fax. [phone]

## 2015-08-20 ENCOUNTER — Telehealth: Payer: Self-pay | Admitting: Oncology

## 2015-08-20 ENCOUNTER — Other Ambulatory Visit: Payer: Commercial Managed Care - PPO

## 2015-08-20 ENCOUNTER — Telehealth: Payer: Self-pay | Admitting: *Deleted

## 2015-08-20 NOTE — Telephone Encounter (Signed)
Received call from scheduling inquiring about cancelling scheduled IV appointments.  Per MD dictation pt is declining any IV therapy per office visit 08/19/2015.  Informed scheduling to cancel IV appointments.

## 2015-08-20 NOTE — Telephone Encounter (Signed)
Left message for patient re appointments for 4/6 and 4/13. Other appointments - tx/ched cxd - per desk nurse patient refuses tx. Left message for [facility] scheduler @ [facility] requesting port removal with Dr. Brantley Stage. [facility] will contact patient re appointments.

## 2015-08-24 ENCOUNTER — Ambulatory Visit: Payer: Commercial Managed Care - PPO

## 2015-08-26 ENCOUNTER — Ambulatory Visit: Payer: Commercial Managed Care - PPO | Admitting: Oncology

## 2015-08-28 ENCOUNTER — Other Ambulatory Visit: Payer: Self-pay | Admitting: *Deleted

## 2015-08-28 DIAGNOSIS — C50412 Malignant neoplasm of upper-outer quadrant of left female breast: Secondary | ICD-10-CM

## 2015-09-14 ENCOUNTER — Ambulatory Visit: Payer: Commercial Managed Care - PPO

## 2015-10-07 ENCOUNTER — Encounter: Payer: Self-pay | Admitting: Nurse Practitioner

## 2015-10-07 DIAGNOSIS — C50412 Malignant neoplasm of upper-outer quadrant of left female breast: Secondary | ICD-10-CM

## 2015-10-07 NOTE — Progress Notes (Signed)
The Survivorship Care Plan was mailed to Meagan [name] as she reported not being able to come in to the Survivorship Clinic for an in-person visit at this time. A letter was mailed to her outlining the purpose of the content of the care plan, as well as encouraging her to reach out to me with any questions or concerns.  My business card was included in the correspondence to the patient as well.  A copy of the care plan was also routed/faxed/mailed to Meagan Sou, MD, the patient's PCP.  I will not be placing any follow-up appointments to the Survivorship Clinic for Meagan [name], but I am happy to see her at any time in the future for any survivorship concerns that may arise. Thank you for allowing me to participate in her care!  Kenn File, [place] [phone]

## 2015-11-19 ENCOUNTER — Other Ambulatory Visit (HOSPITAL_BASED_OUTPATIENT_CLINIC_OR_DEPARTMENT_OTHER): Payer: Commercial Managed Care - PPO

## 2015-11-19 DIAGNOSIS — C50412 Malignant neoplasm of upper-outer quadrant of left female breast: Secondary | ICD-10-CM | POA: Diagnosis not present

## 2015-11-19 LAB — COMPREHENSIVE METABOLIC PANEL
ALBUMIN: 3.6 g/dL (ref 3.5–5.0)
ALK PHOS: 49 U/L (ref 40–150)
ALT: 9 U/L (ref 0–55)
AST: 14 U/L (ref 5–34)
Anion Gap: 5 mEq/L (ref 3–11)
BUN: 10.3 mg/dL (ref 7.0–26.0)
CALCIUM: 9.2 mg/dL (ref 8.4–10.4)
CHLORIDE: 108 meq/L (ref 98–109)
CO2: 27 mEq/L (ref 22–29)
Creatinine: 0.8 mg/dL (ref 0.6–1.1)
EGFR: 80 mL/min/{1.73_m2} — AB (ref >90)
GLUCOSE: 112 mg/dL (ref 70–140)
POTASSIUM: 3.9 meq/L (ref 3.5–5.1)
SODIUM: 139 meq/L (ref 136–145)
Total Bilirubin: 0.54 mg/dL (ref 0.20–1.20)
Total Protein: 7.1 g/dL (ref 6.4–8.3)

## 2015-11-19 LAB — CBC WITH DIFFERENTIAL/PLATELET
BASO%: 0.6 % (ref 0.0–2.0)
BASOS ABS: 0 10*3/uL (ref 0.0–0.1)
EOS ABS: 0.2 10*3/uL (ref 0.0–0.5)
EOS%: 4.3 % (ref 0.0–7.0)
HCT: 32.9 % — ABNORMAL LOW (ref 34.8–46.6)
HEMOGLOBIN: 10.8 g/dL — AB (ref 11.6–15.9)
LYMPH#: 1.3 10*3/uL (ref 0.9–3.3)
LYMPH%: 23.6 % (ref 14.0–49.7)
MCH: 27.8 pg (ref 25.1–34.0)
MCHC: 32.9 g/dL (ref 31.5–36.0)
MCV: 84.6 fL (ref 79.5–101.0)
MONO#: 0.5 10*3/uL (ref 0.1–0.9)
MONO%: 8.1 % (ref 0.0–14.0)
NEUT#: 3.5 10*3/uL (ref 1.5–6.5)
NEUT%: 63.4 % (ref 38.4–76.8)
Platelets: 186 10*3/uL (ref 145–400)
RBC: 3.89 10*6/uL (ref 3.70–5.45)
RDW: 14.1 % (ref 11.2–14.5)
WBC: 5.6 10*3/uL (ref 3.9–10.3)

## 2015-11-20 LAB — CANCER ANTIGEN 27.29: CA 27.29: 18.8 U/mL (ref 0.0–38.6)

## 2015-11-26 ENCOUNTER — Ambulatory Visit (HOSPITAL_BASED_OUTPATIENT_CLINIC_OR_DEPARTMENT_OTHER): Payer: Commercial Managed Care - PPO | Admitting: Oncology

## 2015-11-26 ENCOUNTER — Telehealth: Payer: Self-pay | Admitting: Oncology

## 2015-11-26 VITALS — BP 97/65 | HR 68 | Temp 97.5°F | Resp 18 | Ht 63.0 in | Wt 132.0 lb

## 2015-11-26 DIAGNOSIS — Z170 Estrogen receptor positive status [ER+]: Secondary | ICD-10-CM

## 2015-11-26 DIAGNOSIS — D649 Anemia, unspecified: Secondary | ICD-10-CM | POA: Diagnosis not present

## 2015-11-26 DIAGNOSIS — R51 Headache: Secondary | ICD-10-CM | POA: Diagnosis not present

## 2015-11-26 DIAGNOSIS — C50412 Malignant neoplasm of upper-outer quadrant of left female breast: Secondary | ICD-10-CM | POA: Diagnosis not present

## 2015-11-26 NOTE — Telephone Encounter (Signed)
appt made per 4/13 pof. Per pt she will get appts off MyChart

## 2015-11-26 NOTE — Progress Notes (Signed)
Meagan [name]  [phone] [phone]) [phone]     ID: [name] DOB: January 20, 1967  MR#: [id number]  WOE#:[id number]  Patient Care Team: Tammi Sou, MD as PCP - General (Family Medicine) Erroll Luna, MD as Consulting Physician (General Surgery) Chauncey Cruel, MD as Consulting Physician (Oncology) Thea Silversmith, MD as Consulting Physician (Radiation Oncology) Sylvan Cheese, NP as Nurse Practitioner (Hematology and Oncology) PCP: Tammi Sou, MD GYN: Marylynn Pearson MD OTHER MD: Thea Silversmith MD  CHIEF COMPLAINT:  Triple positive breast cancer  CURRENT TREATMENT: Observation   BREAST CANCER HISTORY: From the original intake note:  Meagan [name] had screening mammography in September 2015 showing left breast calcifications. She was recalled for diagnostic left mammogram 05/06/2014 at the breast Center. This found the breast density to be category C. Magnified views found the calcifications to be consistent with "milk of calcium". Accordingly she was set up for routine screening mammography a year later. However, sometime in July 2016 she noted a bump in her left breast. She eventually brought it to the attention of her primary care physician, who felt some areas of concern in the upper outer quadrants of both breasts.  Screening mammography 06/09/2015 was unremarkable. Bilateral diagnostic mammography with tomography at the breast Center 06/11/2015 found a 7 mm benign cyst in the right breast at the 10:00 position. In the left breast there was a hypoechoic oval mass at the 1:00 position 3 cm from the nipple measuring 10 mm. Biopsy of the left breast mass 06/26/2015 showed ([id number]) an invasive ductal carcinoma, E-cadherin positive, grade 2, estrogen receptor 80% positive, progesterone receptor 90% positive, both with strong staining intensity, with an MIB-1 of 5%, and HER-2 amplified, the signals ratio being 2.27 and the number per cell  5.45.  Bilateral breast MRI 07/06/2015 found a 1 cm mass in the upper outer left breast with central clip artifact. Laterally and inferior to this there was a TRAM track area of enhancement. This was consistent with benign biopsy tract changes. There were no abnormal appearing lymph nodes. There were 2 subcentimeter foci in the liver which were noted to be hepatic cysts in a prior abdominal CT  Her subsequent history is as detailed below.   INTERVAL HISTORY: Meagan [name] returns today for follow-up of her estrogen receptor positive breast cancer accompanied by her significant other Randall Hiss. She is generally doing well, undergoing homeopathic treatment under direction of the physician from [place]. She generally feels well and continues to work full-time.  REVIEW OF SYSTEMS: Andromeda continues to have frequent (every 21 days) and heavy periods. She has stopped iron supplementation at the request of her [place] physician. She's having some headaches, which she attributes to stress and which may be associated with her periods. She does not have any symptoms of sinus problems or seasonal allergies. She did get a new prescription for eyeglasses and that may be a possible cause as well. For exercise she walks about 10 minutes once or twice daily. She describes herself is fatigued. She still has pain in the surgical breast. A detailed review of systems today  PAST MEDICAL HISTORY: Past Medical History  Diagnosis Date  . SAB (spontaneous abortion)   . Hyperlipidemia   . Endometrial polyp   . Endometriosis   . History of iron deficiency anemia   . GERD (gastroesophageal reflux disease)     +Barretts esophagus on endo 09/2012  . Arthritis of hand   . Fibroids   . Helicobacter pylori (H.  pylori)     positive serology 07/2012  . Anemia   . Rosacea   . Shortness of breath     on exertion  . H/O hiatal hernia   . Barrett esophagus   . H. pylori infection   . Uterine fibroid   . Breast cancer, left  ([place]) 06/26/15    1 cm mass, + core bx; invasive ductal carcinoma: suggested tx plan was breast conservation surgery followed by HER-2 based therapy with accompanying chemotherapy, as well as radiation, followed by anti-estrogens.  However, as of 08/2015 f/u with Dr. Jana Hakim she has declined ALL chemo/med/radiation tx's and wants her port removed.  . Sleep apnea     no CPAP    PAST SURGICAL HISTORY: Past Surgical History  Procedure Laterality Date  . Myomectomy abdominal approach    . Polypectomy  2013    ENDOMETRIAL POLYP  . Wisdom tooth extraction    . Hysteroscopy  2010  . Exp. lap. left ovarian cystectomy / lysis adhesions/ myomectomy  2008  . D & c hysteroscopy/ polypectomy/ dx laparoscopy/ lysis adhesions/ ablation endometriosis  04-10-2009  . Hysteroscopy w/d&c  08/25/2011    Procedure: DILATATION AND CURETTAGE /HYSTEROSCOPY;  Surgeon: Marylynn Pearson;  Location: [facility];  Service: Gynecology;;  . Myomectomy    . Esophagogastroduodenoscopy  10/01/12    chronic inactive gastritis (H. pylori neg, no dysplasia or malig),+ Barretts esophagus (Dr. Hilarie Fredrickson)  . Dilatation & curettage/hysteroscopy with trueclear N/A 03/29/2013    Procedure: DILATATION & CURETTAGE/HYSTEROSCOPY WITH TRUECLEAR;  Surgeon: Marylynn Pearson, MD;  Location: [facility];  Service: Gynecology;  Laterality: N/A;  . Breast biopsy  06/29/15    Core needle: + invasive mammary carcinoma  . Dilation and curettage of uterus    . Breast lumpectomy with radioactive seed and sentinel lymph node biopsy Left 07/28/2015    Path: invasive ductal carcinoma, neg margins, sentinal LN x 3 neg for tumor.  Procedure: LEFT BREAST LUMPECTOMY WITH RADIOACTIVE SEED AND SENTINEL LYMPH NODE BIOPSY;  Surgeon: Erroll Luna, MD;  Location: [facility];  Service: General;  Laterality: Left;  . Portacath placement Right 07/28/2015    Procedure: INSERTION PORT-A-CATH WITH ULTRA SOUND;  Surgeon: Erroll Luna, MD;   Location: [facility];  Service: General;  Laterality: Right;    FAMILY HISTORY Family History  Problem Relation Age of Onset  . Prostate cancer Father   . Hypertension Father   . Anuerysm Father   . Heart disease Father   . Uterine cancer Maternal Grandmother   . Heart disease Mother   . Heart disease Maternal Aunt   . Heart disease Maternal Uncle   . Heart disease Paternal Uncle   . Stomach cancer Other   . Diabetes Other     half sister and brother  . Fibroids Sister     uterine  . Esophageal cancer Neg Hx   . Colon cancer Neg Hx    the patient's father died from a brain aneurysm at the age of 56. The patient's mother is living, age 6. The patient has 3 brothers, one sister. The patient's father was diagnosed with prostate cancer shortly before his death. On the mother'side, the grandmother may have had uterine cancer. The patient is not aware of any breast or ovarian cancers in the family  GYNECOLOGIC HISTORY:  No LMP recorded. Menarche age 67, the patient is G4 P0. She tells me she had 4 miscarriages felt to be due to fibroids. She had  a myomectomy procedure but then was unable to become pregnant. She still having regular periods. At this point however she feels she is "too old" to have children and if she became menopausal with treatment that would not be a problem  SOCIAL HISTORY:  Dashonna works for [employer]. She is originally from [place]. She is married but separated. Her significant other, Randall Hiss, is [place]. He also works for [employer].    ADVANCED DIRECTIVES: We discussed the fact that if the patient is not legally divorced, her husband, though separated, might claim to be her healthcare power of attorney. Accordingly I gave her the appropriate forms for her to complete and notarize at her discretion.   HEALTH MAINTENANCE: Social History  Substance Use Topics  . Smoking status: Never Smoker   . Smokeless tobacco: Never Used  .  Alcohol Use: 0.6 - 1.2 oz/week    1-2 Glasses of wine per week     Comment: OCCASIONAL     Colonoscopy: 08/13/2013/[facility]  PAP: [facility]  Bone density:  Lipid panel:  No Known Allergies  Current Outpatient Prescriptions  Medication Sig Dispense Refill  . FIBER SELECT GUMMIES CHEW Chew 2 capsules by mouth daily.    . Multiple Vitamin (MULTIVITAMIN WITH MINERALS) TABS tablet Take 1 tablet by mouth daily. *Mixes with all greens and opc 3*    . pantoprazole (PROTONIX) 40 MG tablet TAKE 1 TABLET (40 MG TOTAL) BY MOUTH DAILY 30 tablet 11  . pravastatin (PRAVACHOL) 40 MG tablet Take 1 tablet (40 mg total) by mouth daily. 30 tablet 1   No current facility-administered medications for this visit.    OBJECTIVE: Middle-aged Latin American woman In no acute distress Filed Vitals:   11/26/15 1446  BP: 97/65  Pulse: 68  Temp: 97.5 F (36.4 C)  Resp: 18     Body mass index is 23.39 kg/(m^2).    ECOG FS:1 - Symptomatic but completely ambulatory  Sclerae unicteric, EOMs intact Oropharynx clear, dentition in good repair No cervical or supraclavicular adenopathy Lungs no rales or rhonchi Heart regular rate and rhythm Abd soft, nontender, positive bowel sounds MSK no focal spinal tenderness, no upper extremity lymphedema Neuro: nonfocal, well oriented, appropriate affect Breasts: The right breast is unremarkable. The left breast is status post lumpectomy. There is no evidence of local recurrence. The left axilla is benign  LAB RESULTS:  CMP     Component Value Date/Time   NA 139 11/19/2015 1508   NA 138 05/02/2015 1445   K 3.9 11/19/2015 1508   K 4.1 05/02/2015 1445   CL 105 05/02/2015 1445   CO2 27 11/19/2015 1508   CO2 28 05/02/2015 1445   GLUCOSE 112 11/19/2015 1508   GLUCOSE 92 05/02/2015 1445   BUN 10.3 11/19/2015 1508   BUN 15 05/02/2015 1445   CREATININE 0.8 11/19/2015 1508   CREATININE 0.67 05/02/2015 1445   CALCIUM 9.2 11/19/2015 1508   CALCIUM 9.9 05/02/2015 1445    PROT 7.1 11/19/2015 1508   PROT 7.7 05/02/2015 1445   ALBUMIN 3.6 11/19/2015 1508   ALBUMIN 4.3 05/02/2015 1445   AST 14 11/19/2015 1508   AST 17 05/02/2015 1445   ALT 9 11/19/2015 1508   ALT 13* 05/02/2015 1445   ALKPHOS 49 11/19/2015 1508   ALKPHOS 53 05/02/2015 1445   BILITOT 0.54 11/19/2015 1508   BILITOT 0.7 05/02/2015 1445   GFRNONAA >60 05/02/2015 1445   GFRAA >60 05/02/2015 1445    INo results found for: SPEP,  UPEP  Lab Results  Component Value Date   WBC 5.6 11/19/2015   NEUTROABS 3.5 11/19/2015   HGB 10.8* 11/19/2015   HCT 32.9* 11/19/2015   MCV 84.6 11/19/2015   PLT 186 11/19/2015      Chemistry      Component Value Date/Time   NA 139 11/19/2015 1508   NA 138 05/02/2015 1445   K 3.9 11/19/2015 1508   K 4.1 05/02/2015 1445   CL 105 05/02/2015 1445   CO2 27 11/19/2015 1508   CO2 28 05/02/2015 1445   BUN 10.3 11/19/2015 1508   BUN 15 05/02/2015 1445   CREATININE 0.8 11/19/2015 1508   CREATININE 0.67 05/02/2015 1445      Component Value Date/Time   CALCIUM 9.2 11/19/2015 1508   CALCIUM 9.9 05/02/2015 1445   ALKPHOS 49 11/19/2015 1508   ALKPHOS 53 05/02/2015 1445   AST 14 11/19/2015 1508   AST 17 05/02/2015 1445   ALT 9 11/19/2015 1508   ALT 13* 05/02/2015 1445   BILITOT 0.54 11/19/2015 1508   BILITOT 0.7 05/02/2015 1445       No results found for: LABCA2  No components found for: LABCA125  No results for input(s): INR in the last 168 hours.  Urinalysis    Component Value Date/Time   COLORURINE YELLOW 05/02/2015 1455   APPEARANCEUR CLEAR 05/02/2015 1455   LABSPEC 1.030 05/02/2015 1455   PHURINE 5.5 05/02/2015 1455   GLUCOSEU NEGATIVE 05/02/2015 1455   HGBUR NEGATIVE 05/02/2015 1455   BILIRUBINUR NEGATIVE 05/02/2015 1455   KETONESUR NEGATIVE 05/02/2015 1455   PROTEINUR NEGATIVE 05/02/2015 1455   UROBILINOGEN 0.2 05/02/2015 1455   NITRITE NEGATIVE 05/02/2015 1455   LEUKOCYTESUR NEGATIVE 05/02/2015 1455    STUDIES: No results  found.  ASSESSMENT: 55 y.o. [facility] woman status post left breast biopsy 06/26/2015 for a clinical T1b N0, stage IA invasive ductal carcinoma, grade 2, estrogen and progesterone receptor positive, HER-2 amplified, with a low MIB-1  (1) status post left lumpectomy and sentinel lymph node sampling 07/28/2015 for a  pT1b pN0, stage IA invasive ductal carcinoma, with negative margins.   (2) standard therapy (antiestrogens and  immunotherapy with chemotherapy followed by radiation and anti-estrogens)  refused by patient after extensive discussion 08/22/2015  PLAN:  we discussed her lab work, which is significant only for anemia. This is going to be related to her heavy in frequent periods. It would be useful if she could start iron but apparently this is permitting by her homeopathic physician. I suggested she discuss with him how he would like her to replenish her iron since she does not become even more anemic.  I think the headaches are going to be related to her prescription, which needs updating, or possibly to stress and the position she sits at work. There are not suggestive of brain metastases. She was reassured by this.  We discussed the CA-27-29 which I'm obtaining at her request. She understands that this is a very imperfect test and does not guarantee that a patient does not have breast cancer. Nevertheless it is normal in her case and that is reassuring as far as goes.  I think she would feel better if she exercised regularly. I gave her information on the trial to recovery program.  She is going to see me again in late November after her next set of mammograms. She knows to call for any problems that may develop before that visit.  :Chauncey Cruel, MD   11/26/2015 3:08 PM  Medical Oncology and Hematology [facility] [address] [place], [place] [address] Tel. [phone]    Fax. [phone]

## 2016-02-16 ENCOUNTER — Other Ambulatory Visit: Payer: Self-pay | Admitting: Nurse Practitioner

## 2016-05-12 ENCOUNTER — Encounter: Payer: Self-pay | Admitting: *Deleted

## 2016-05-23 ENCOUNTER — Encounter: Payer: Self-pay | Admitting: Internal Medicine

## 2016-06-01 ENCOUNTER — Encounter: Payer: Self-pay | Admitting: Internal Medicine

## 2016-06-03 ENCOUNTER — Telehealth: Payer: Self-pay | Admitting: *Deleted

## 2016-06-03 ENCOUNTER — Telehealth: Payer: Self-pay | Admitting: Oncology

## 2016-06-03 NOTE — Telephone Encounter (Signed)
Call from pt requesting to move her lab and MD visit up one month. She denies any specific new issues, stated she is worried. Message forwarded to collaborative RN.

## 2016-06-03 NOTE — Telephone Encounter (Signed)
Returned call to patient to reschedule 11/29 and 12/6 appointments. The patient will be leaving for [place] and needed to reschedule for earlier appointments. Rescheduled the appointments for the patient.

## 2016-06-13 ENCOUNTER — Ambulatory Visit
Admission: RE | Admit: 2016-06-13 | Discharge: 2016-06-13 | Disposition: A | Discharge status: Home / Self Care | Payer: Commercial Managed Care - PPO | Source: Ambulatory Visit | Attending: Oncology | Admitting: Oncology

## 2016-06-13 ENCOUNTER — Other Ambulatory Visit: Payer: Self-pay | Admitting: Oncology

## 2016-06-13 DIAGNOSIS — C50412 Malignant neoplasm of upper-outer quadrant of left female breast: Secondary | ICD-10-CM

## 2016-06-15 DIAGNOSIS — K2270 Barrett's esophagus without dysplasia: Secondary | ICD-10-CM

## 2016-06-15 HISTORY — DX: Barrett's esophagus without dysplasia: K22.70

## 2016-06-22 ENCOUNTER — Other Ambulatory Visit (HOSPITAL_BASED_OUTPATIENT_CLINIC_OR_DEPARTMENT_OTHER): Payer: Commercial Managed Care - PPO

## 2016-06-22 DIAGNOSIS — C50412 Malignant neoplasm of upper-outer quadrant of left female breast: Secondary | ICD-10-CM | POA: Diagnosis not present

## 2016-06-22 LAB — COMPREHENSIVE METABOLIC PANEL
ALK PHOS: 56 U/L (ref 40–150)
ALT: 14 U/L (ref 0–55)
ANION GAP: 8 meq/L (ref 3–11)
AST: 15 U/L (ref 5–34)
Albumin: 3.9 g/dL (ref 3.5–5.0)
BILIRUBIN TOTAL: 0.51 mg/dL (ref 0.20–1.20)
BUN: 14.9 mg/dL (ref 7.0–26.0)
CO2: 24 meq/L (ref 22–29)
Calcium: 9.4 mg/dL (ref 8.4–10.4)
Chloride: 105 mEq/L (ref 98–109)
Creatinine: 0.9 mg/dL (ref 0.6–1.1)
EGFR: 78 mL/min/{1.73_m2} — AB (ref >90)
GLUCOSE: 109 mg/dL (ref 70–140)
POTASSIUM: 3.9 meq/L (ref 3.5–5.1)
SODIUM: 137 meq/L (ref 136–145)
TOTAL PROTEIN: 7.7 g/dL (ref 6.4–8.3)

## 2016-06-22 LAB — CBC WITH DIFFERENTIAL/PLATELET
BASO%: 0.7 % (ref 0.0–2.0)
BASOS ABS: 0 10*3/uL (ref 0.0–0.1)
EOS ABS: 0.4 10*3/uL (ref 0.0–0.5)
EOS%: 7.2 % — AB (ref 0.0–7.0)
HCT: 28.4 % — ABNORMAL LOW (ref 34.8–46.6)
HGB: 8.5 g/dL — ABNORMAL LOW (ref 11.6–15.9)
LYMPH%: 29.3 % (ref 14.0–49.7)
MCH: 21.7 pg — AB (ref 25.1–34.0)
MCHC: 29.9 g/dL — ABNORMAL LOW (ref 31.5–36.0)
MCV: 72.6 fL — AB (ref 79.5–101.0)
MONO#: 0.4 10*3/uL (ref 0.1–0.9)
MONO%: 7.6 % (ref 0.0–14.0)
NEUT#: 3 10*3/uL (ref 1.5–6.5)
NEUT%: 55.2 % (ref 38.4–76.8)
PLATELETS: 253 10*3/uL (ref 145–400)
RBC: 3.91 10*6/uL (ref 3.70–5.45)
RDW: 17.1 % — ABNORMAL HIGH (ref 11.2–14.5)
WBC: 5.4 10*3/uL (ref 3.9–10.3)
lymph#: 1.6 10*3/uL (ref 0.9–3.3)
nRBC: 0 % (ref 0–0)

## 2016-06-23 LAB — CANCER ANTIGEN 27.29: CAN 27.29: 14.3 U/mL (ref 0.0–38.6)

## 2016-06-24 ENCOUNTER — Other Ambulatory Visit: Payer: Self-pay | Admitting: Obstetrics and Gynecology

## 2016-06-24 DIAGNOSIS — N6320 Unspecified lump in the left breast, unspecified quadrant: Secondary | ICD-10-CM

## 2016-06-29 ENCOUNTER — Other Ambulatory Visit: Payer: Commercial Managed Care - PPO

## 2016-06-29 ENCOUNTER — Telehealth: Payer: Self-pay | Admitting: *Deleted

## 2016-06-29 ENCOUNTER — Ambulatory Visit (HOSPITAL_BASED_OUTPATIENT_CLINIC_OR_DEPARTMENT_OTHER): Payer: Commercial Managed Care - PPO | Admitting: Oncology

## 2016-06-29 VITALS — BP 100/56 | HR 68 | Temp 97.4°F | Resp 17 | Ht 63.0 in | Wt 128.6 lb

## 2016-06-29 DIAGNOSIS — K2270 Barrett's esophagus without dysplasia: Secondary | ICD-10-CM | POA: Diagnosis not present

## 2016-06-29 DIAGNOSIS — D509 Iron deficiency anemia, unspecified: Secondary | ICD-10-CM | POA: Insufficient documentation

## 2016-06-29 DIAGNOSIS — Z170 Estrogen receptor positive status [ER+]: Secondary | ICD-10-CM | POA: Diagnosis not present

## 2016-06-29 DIAGNOSIS — D500 Iron deficiency anemia secondary to blood loss (chronic): Secondary | ICD-10-CM | POA: Insufficient documentation

## 2016-06-29 DIAGNOSIS — C50412 Malignant neoplasm of upper-outer quadrant of left female breast: Secondary | ICD-10-CM | POA: Diagnosis not present

## 2016-06-29 NOTE — Telephone Encounter (Signed)
Ok for EGD

## 2016-06-29 NOTE — Telephone Encounter (Signed)
Dr. Hilarie Fredrickson,  This has been dx with breast CA within the last year.  She is scheduled for an EGD for a hx of Barretts on 07-14-16.  She had a lumpectomy with lymph node sampling in December 2016, but no follow up chemo or radiation  In [place], we are just supposed to check with MD to make sure ok to go ahead with scheduled procedure if they have been treated for any type of cancer within the last year.  Thanks, [employer]

## 2016-06-29 NOTE — Telephone Encounter (Signed)
noted 

## 2016-06-29 NOTE — Progress Notes (Signed)
[facility]  [phone] [phone]     ID: MARSHA GUNDLACH DOB: [birth year]  MR#: [id number]  YWV#:[id number]  Patient Care Team: Tammi Sou, MD as PCP - General (Family Medicine) Erroll Luna, MD as Consulting Physician (General Surgery) Chauncey Cruel, MD as Consulting Physician (Oncology) Thea Silversmith, MD as Consulting Physician (Radiation Oncology) Sylvan Cheese, NP as Nurse Practitioner (Hematology and Oncology) Jerene Bears, MD as Consulting Physician (Gastroenterology) PCP: Tammi Sou, MD GYN: Marylynn Pearson MD OTHER MD: Thea Silversmith MD  CHIEF COMPLAINT:  Triple positive breast cancer  CURRENT TREATMENT: Observation   BREAST CANCER HISTORY: From the original intake note:  Shanti had screening mammography in September 2015 showing left breast calcifications. She was recalled for diagnostic left mammogram 05/06/2014 at the breast Center. This found the breast density to be category C. Magnified views found the calcifications to be consistent with "milk of calcium". Accordingly she was set up for routine screening mammography a year later. However, sometime in July 2016 she noted a bump in her left breast. She eventually brought it to the attention of her primary care physician, who felt some areas of concern in the upper outer quadrants of both breasts.  Screening mammography 06/09/2015 was unremarkable. Bilateral diagnostic mammography with tomography at the breast Center 06/11/2015 found a 7 mm benign cyst in the right breast at the 10:00 position. In the left breast there was a hypoechoic oval mass at the 1:00 position 3 cm from the nipple measuring 10 mm. Biopsy of the left breast mass 06/26/2015 showed ([id number]) an invasive ductal carcinoma, E-cadherin positive, grade 2, estrogen receptor 80% positive, progesterone receptor 90% positive, both with strong staining intensity, with an MIB-1 of 5%, and HER-2  amplified, the signals ratio being 2.27 and the number per cell 5.45.  Bilateral breast MRI 07/06/2015 found a 1 cm mass in the upper outer left breast with central clip artifact. Laterally and inferior to this there was a TRAM track area of enhancement. This was consistent with benign biopsy tract changes. There were no abnormal appearing lymph nodes. There were 2 subcentimeter foci in the liver which were noted to be hepatic cysts in a prior abdominal CT  Her subsequent history is as detailed below.   INTERVAL HISTORY: Mashell returns today for follow-up of her breast cancer accompanied by her significant other Randall Hiss. She has refused antiestrogen therapy and continues under the care of a homeopathic physician from [place].  REVIEW OF SYSTEMS: Meghin is doing "fine". Sometimes she has epigastric pain which can be "terrible" and can radiate to the back. She is having very heavy periods. She has a history of Barrett's and is going to be seeing GI soon for upper endoscopy she tells me. She describes herself as moderately fatigued. She has occasional cramps. She has slight urinary stress incontinence. She is exercising by walking, but very intermittently and only for 10 or 15 minutes a day at most. A detailed review of systems today was otherwise stable  PAST MEDICAL HISTORY: Past Medical History:  Diagnosis Date  . Anemia   . Arthritis of hand   . Barrett esophagus   . Breast cancer, left ([facility]) 06/26/15   1 cm mass, + core bx; invasive ductal carcinoma: suggested tx plan was breast conservation surgery followed by HER-2 based therapy with accompanying chemotherapy, as well as radiation, followed by anti-estrogens.  However, as of 08/2015 f/u with Dr. Jana Hakim she has declined ALL chemo/med/radiation tx's  and wants her port removed.  . Endometrial polyp   . Endometriosis   . Fibroids   . GERD (gastroesophageal reflux disease)    +Barretts esophagus on endo 09/2012  . H. pylori infection    . H/O hiatal hernia   . Helicobacter pylori (H. pylori)    positive serology 07/2012  . History of iron deficiency anemia   . Hyperlipidemia   . Rosacea   . SAB (spontaneous abortion)   . Shortness of breath    on exertion  . Sleep apnea    no CPAP  . Uterine fibroid     PAST SURGICAL HISTORY: Past Surgical History:  Procedure Laterality Date  . BREAST BIOPSY  06/29/15   Core needle: + invasive mammary carcinoma  . BREAST LUMPECTOMY WITH RADIOACTIVE SEED AND SENTINEL LYMPH NODE BIOPSY Left 07/28/2015   Path: invasive ductal carcinoma, neg margins, sentinal LN x 3 neg for tumor.  Procedure: LEFT BREAST LUMPECTOMY WITH RADIOACTIVE SEED AND SENTINEL LYMPH NODE BIOPSY;  Surgeon: Erroll Luna, MD;  Location: [facility];  Service: General;  Laterality: Left;  . D & C HYSTEROSCOPY/ POLYPECTOMY/ DX LAPAROSCOPY/ LYSIS ADHESIONS/ ABLATION ENDOMETRIOSIS  04-10-2009  . DILATATION & CURETTAGE/HYSTEROSCOPY WITH TRUECLEAR N/A 03/29/2013   Procedure: DILATATION & CURETTAGE/HYSTEROSCOPY WITH TRUECLEAR;  Surgeon: Marylynn Pearson, MD;  Location: [facility];  Service: Gynecology;  Laterality: N/A;  . DILATION AND CURETTAGE OF UTERUS    . ESOPHAGOGASTRODUODENOSCOPY  10/01/12   chronic inactive gastritis (H. pylori neg, no dysplasia or malig),+ Barretts esophagus (Dr. Hilarie Fredrickson)  . EXP. LAP. LEFT OVARIAN CYSTECTOMY / LYSIS ADHESIONS/ MYOMECTOMY  2008  . HYSTEROSCOPY  2010  . HYSTEROSCOPY W/D&C  08/25/2011   Procedure: DILATATION AND CURETTAGE /HYSTEROSCOPY;  Surgeon: Marylynn Pearson;  Location: [facility];  Service: Gynecology;;  . MYOMECTOMY    . MYOMECTOMY ABDOMINAL APPROACH    . POLYPECTOMY  2013   ENDOMETRIAL POLYP  . PORTACATH PLACEMENT Right 07/28/2015   Procedure: INSERTION PORT-A-CATH WITH ULTRA SOUND;  Surgeon: Erroll Luna, MD;  Location: [facility];  Service: General;  Laterality: Right;  . WISDOM TOOTH EXTRACTION      FAMILY HISTORY Family  History  Problem Relation Age of Onset  . Prostate cancer Father   . Hypertension Father   . Anuerysm Father   . Heart disease Father   . Heart disease Mother   . Fibroids Sister     uterine  . Uterine cancer Maternal Grandmother   . Heart disease Maternal Aunt   . Heart disease Maternal Uncle   . Heart disease Paternal Uncle   . Stomach cancer Other   . Diabetes Other     half sister and brother  . Esophageal cancer Neg Hx   . Colon cancer Neg Hx    the patient's father died from a brain aneurysm at the age of 98. The patient's mother is living, age 21. The patient has 3 brothers, one sister. The patient's father was diagnosed with prostate cancer shortly before his death. On the mother'side, the grandmother may have had uterine cancer. The patient is not aware of any breast or ovarian cancers in the family  GYNECOLOGIC HISTORY:  No LMP recorded. Menarche age 75, the patient is G4 P0. She tells me she had 4 miscarriages felt to be due to fibroids. She had a myomectomy procedure but then was unable to become pregnant. She still having regular periods. At this point however she feels she is "too old"  to have children and if she became menopausal with treatment that would not be a problem  SOCIAL HISTORY:  Veronnica works for [employer]. She is originally from [place] She is married but separated. Her significant other, Randall Hiss, is [place]. He also works for [employer].    ADVANCED DIRECTIVES: We discussed the fact that if the patient is not legally divorced, her husband, though separated, might claim to be her healthcare power of attorney. Accordingly I have given her the appropriate forms for her to complete and notarize at her discretion.   HEALTH MAINTENANCE: Social History  Substance Use Topics  . Smoking status: Never Smoker  . Smokeless tobacco: Never Used  . Alcohol use 0.6 - 1.2 oz/week    1 - 2 Glasses of wine per week     Comment: OCCASIONAL      Colonoscopy: 08/13/2013/[facility]  PAP: [facility]  Bone density:  Lipid panel:  No Known Allergies  Current Outpatient Prescriptions  Medication Sig Dispense Refill  . FIBER SELECT GUMMIES CHEW Chew 2 capsules by mouth daily.    . pantoprazole (PROTONIX) 40 MG tablet TAKE 1 TABLET (40 MG TOTAL) BY MOUTH DAILY 30 tablet 11   No current facility-administered medications for this visit.     OBJECTIVE: Middle-aged Latin American woman Who appears well Vitals:   06/29/16 1430  BP: (!) 100/56  Pulse: 68  Resp: 17  Temp: 97.4 F (36.3 C)     Body mass index is 22.78 kg/m.    ECOG FS:1 - Symptomatic but completely ambulatory  Sclerae unicteric, pupils round and equal Oropharynx clear and moist-- no thrush or other lesions No cervical or supraclavicular adenopathy Lungs no rales or rhonchi Heart regular rate and rhythm Abd soft, nontender, positive bowel sounds MSK no focal spinal tenderness, no upper extremity lymphedema Neuro: nonfocal, well oriented, appropriate affect Breasts: The right breast is benign. The left breast is status post lumpectomy with no evidence of recurrence. The left axilla is benign.  LAB RESULTS:  CMP     Component Value Date/Time   NA 137 06/22/2016 1421   K 3.9 06/22/2016 1421   CL 105 05/02/2015 1445   CO2 24 06/22/2016 1421   GLUCOSE 109 06/22/2016 1421   BUN 14.9 06/22/2016 1421   CREATININE 0.9 06/22/2016 1421   CALCIUM 9.4 06/22/2016 1421   PROT 7.7 06/22/2016 1421   ALBUMIN 3.9 06/22/2016 1421   AST 15 06/22/2016 1421   ALT 14 06/22/2016 1421   ALKPHOS 56 06/22/2016 1421   BILITOT 0.51 06/22/2016 1421   GFRNONAA >60 05/02/2015 1445   GFRAA >60 05/02/2015 1445    INo results found for: SPEP, UPEP  Lab Results  Component Value Date   WBC 5.4 06/22/2016   NEUTROABS 3.0 06/22/2016   HGB 8.5 (L) 06/22/2016   HCT 28.4 (L) 06/22/2016   MCV 72.6 (L) 06/22/2016   PLT 253 06/22/2016      Chemistry      Component Value Date/Time    NA 137 06/22/2016 1421   K 3.9 06/22/2016 1421   CL 105 05/02/2015 1445   CO2 24 06/22/2016 1421   BUN 14.9 06/22/2016 1421   CREATININE 0.9 06/22/2016 1421      Component Value Date/Time   CALCIUM 9.4 06/22/2016 1421   ALKPHOS 56 06/22/2016 1421   AST 15 06/22/2016 1421   ALT 14 06/22/2016 1421   BILITOT 0.51 06/22/2016 1421       No results found for: LABCA2  No components found for: LABCA125  No results for input(s): INR in the last 168 hours.  Urinalysis    Component Value Date/Time   COLORURINE YELLOW 05/02/2015 1455   APPEARANCEUR CLEAR 05/02/2015 1455   LABSPEC 1.030 05/02/2015 1455   PHURINE 5.5 05/02/2015 1455   GLUCOSEU NEGATIVE 05/02/2015 1455   HGBUR NEGATIVE 05/02/2015 1455   BILIRUBINUR NEGATIVE 05/02/2015 1455   KETONESUR NEGATIVE 05/02/2015 1455   PROTEINUR NEGATIVE 05/02/2015 1455   UROBILINOGEN 0.2 05/02/2015 1455   NITRITE NEGATIVE 05/02/2015 1455   LEUKOCYTESUR NEGATIVE 05/02/2015 1455    STUDIES: US Breast Ltd Uni Left Inc Axilla  Result Date: 06/13/2016 CLINICAL DATA:  55 year old female with history of left lumpectomy in December 2016 demonstrating grade 2 invasive ductal carcinoma 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 Sentinel lymph node biopsy was negative. The patient has declined chemotherapy and radiation. The patient receives homeopathic therapy under the direction of a physician from [place]. She now feels a new lump superior to her lumpectomy scar in the upper, outer left breast for 1 week. EXAM: 2D DIGITAL DIAGNOSTIC BILATERAL MAMMOGRAM WITH CAD AND ADJUNCT TOMO ULTRASOUND LEFT BREAST COMPARISON:  Previous exam(s). ACR Breast Density Category c: The breast tissue is heterogeneously dense, which may obscure small masses. FINDINGS: New postlumpectomy changes are seen of the upper, outer left breast. No new or suspicious abnormality is identified within either breast. Mammographic images were processed with CAD. On physical  exam, there is thickening in the patient's area of concern in the upper, outer left breast without discrete mass. Targeted ultrasound is performed, showing no suspicious cystic or solid sonographic finding in the patient's area of concern. Expected postsurgical changes related to the patient's lumpectomy are noted. IMPRESSION: No mammographic or sonographic evidence of malignancy. RECOMMENDATION: 1. Bilateral diagnostic mammogram in 1 year. 2. The patient was instructed to follow-up with her referring clinician regarding her area of concern in the left breast. If there is persistent clinical concern, breast MRI may be considered for further evaluation. The patient states that she has an upcoming appointment with Dr. Jana Hakim and will discuss this with him at that time. I have discussed the findings and recommendations with the patient. Results were also provided in writing at the conclusion of the visit. If applicable, a reminder letter will be sent to the patient regarding the next appointment. BI-RADS CATEGORY  2: Benign. Electronically Signed   By: Pamelia Hoit M.D.   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 Sentinel lymph node biopsy was negative. The patient has declined chemotherapy and radiation. The patient receives homeopathic therapy under the direction of a physician from [place]. She now feels a new lump superior to her lumpectomy scar in the upper, outer left breast for 1 week. EXAM: 2D DIGITAL DIAGNOSTIC BILATERAL MAMMOGRAM WITH CAD AND ADJUNCT TOMO ULTRASOUND LEFT BREAST COMPARISON:  Previous exam(s). ACR Breast Density Category c: The breast tissue is heterogeneously dense, which may obscure small masses. FINDINGS: New postlumpectomy changes are seen of the upper, outer left  breast. No new or suspicious abnormality is identified within either breast. Mammographic images were processed with CAD. On physical exam, there is thickening in the patient's area of concern in the upper, outer left breast without discrete mass. Targeted ultrasound is performed, showing no suspicious cystic or  solid sonographic finding in the patient's area of concern. Expected postsurgical changes related to the patient's lumpectomy are noted. IMPRESSION: No mammographic or sonographic evidence of malignancy. RECOMMENDATION: 1. Bilateral diagnostic mammogram in 1 year. 2. The patient was instructed to follow-up with her referring clinician regarding her area of concern in the left breast. If there is persistent clinical concern, breast MRI may be considered for further evaluation. The patient states that she has an upcoming appointment with Dr. Jana Hakim and will discuss this with him at that time. I have discussed the findings and recommendations with the patient. Results were also provided in writing at the conclusion of the visit. If applicable, a reminder letter will be sent to the patient regarding the next appointment. BI-RADS CATEGORY  2: Benign. Electronically Signed   By: Pamelia Hoit M.D.   On: 06/13/2016 17:11    ASSESSMENT: 55 y.o. [facility] woman status post left breast biopsy 06/26/2015 for a clinical T1b N0, stage IA invasive ductal carcinoma, grade 2, estrogen and progesterone receptor positive, HER-2 amplified, with a low MIB-1  (1) status post left lumpectomy and sentinel lymph node sampling 07/28/2015 for a  pT1b pN0, stage IA invasive ductal carcinoma, with negative margins.   (2) standard therapy (antiestrogens and  immunotherapy with chemotherapy followed by radiation and anti-estrogens)  refused by patient after extensive discussion 08/22/2015  PLAN: Avanti is anxious about the possibility of breast cancer recurrence but at the same time unwilling to proceed with standard therapy.  Instead she would be interested in intensified screening with yearly MRIs.  We discussed the fact that we can obtain an MRI at present because it was suggested by radiology for further evaluation of the unclear findings in her mammography and ultrasonography, but that yearly MRIs for reasons of screening only are advised in cases of genetic mutations on a very high risk patients.  I agree that she is a high-risk patient because she has refused standard therapy. I would like to obtain a yearly MRI of possible we will see if this is something that we can arrange for.  In the meantime she is clearly iron deficient. She tells me it is absolutely impossible for her to receive oral iron because of her problems with Barrett's esophagus and reflux and because it causes a severe stomach problems. She tells that she has had intravenous iron in the past and this has worked well for her. We discussed the possible toxicities, side effects and complications of Feraheme which include rare severe reactions which might even lead to death. She was given this information in writing (the hand out from up to date for patients). She is very interested in proceeding and we are going ahead with Feraheme for 2 doses in the near future.  We are setting her up for a breast MRI as discussed. Otherwise she will return to see me in one year. However we will repeat lab work in approximately 6 months to make sure she does not need further iron since she continues to have very heavy periods likely due to her known fibroids.  Chauncey Cruel, MD   06/30/2016 8:08 AM Medical Oncology and Hematology [facility] [address] [place], [place] [address] Tel. [phone]    Fax. [phone]  Warning  . Very bad and sometimes deadly allergic reactions have happened with this drug. Allergic reactions have happened in people after the first dose and also in people who have used this drug before without a reaction.  Early  signs may include heart attack, low blood pressure, or passing out. The chance of an allergic reaction may be higher if you have allergies to many other drugs. Talk with the doctor.  . You will be watched closely for at least 30 minutes after you receive this drug. This includes having your blood pressure and heart rate checked. Tell your doctor if you have bluish skin or nails; chest, jaw, or left arm pain; itching; rash; swelling of the tongue or throat; signs of low blood pressure like dizziness or passing out; trouble breathing; wheezing; or any other bad effects during or after the infusion.

## 2016-07-02 ENCOUNTER — Other Ambulatory Visit: Payer: Self-pay | Admitting: Internal Medicine

## 2016-07-03 ENCOUNTER — Encounter: Payer: Self-pay | Admitting: Family Medicine

## 2016-07-04 ENCOUNTER — Other Ambulatory Visit: Payer: Self-pay | Admitting: Obstetrics and Gynecology

## 2016-07-04 DIAGNOSIS — N63 Unspecified lump in unspecified breast: Secondary | ICD-10-CM

## 2016-07-05 ENCOUNTER — Other Ambulatory Visit: Payer: Self-pay | Admitting: *Deleted

## 2016-07-05 ENCOUNTER — Ambulatory Visit: Payer: Commercial Managed Care - PPO | Admitting: *Deleted

## 2016-07-05 ENCOUNTER — Other Ambulatory Visit: Payer: Self-pay | Admitting: Oncology

## 2016-07-05 ENCOUNTER — Ambulatory Visit (HOSPITAL_BASED_OUTPATIENT_CLINIC_OR_DEPARTMENT_OTHER): Payer: Commercial Managed Care - PPO

## 2016-07-05 VITALS — Ht 63.0 in | Wt 128.0 lb

## 2016-07-05 DIAGNOSIS — D509 Iron deficiency anemia, unspecified: Secondary | ICD-10-CM | POA: Diagnosis not present

## 2016-07-05 DIAGNOSIS — K2270 Barrett's esophagus without dysplasia: Secondary | ICD-10-CM

## 2016-07-05 MED ORDER — SODIUM CHLORIDE 0.9 % IV SOLN
510.0000 mg | Freq: Once | INTRAVENOUS | Status: AC
  Administered 2016-07-05: 510 mg via INTRAVENOUS
  Filled 2016-07-05: qty 17

## 2016-07-05 NOTE — Progress Notes (Signed)
Denies allergies to eggs or soy products. Denies complications with sedation or anesthesia. Denies O2 use. Denies use of diet or weight loss medications.  Emmi instructions given for endoscopy.  

## 2016-07-05 NOTE — Patient Instructions (Signed)
Ferumoxytol injection What is this medicine? FERUMOXYTOL is an iron complex. Iron is used to make healthy red blood cells, which carry oxygen and nutrients throughout the body. This medicine is used to treat iron deficiency anemia in people with chronic kidney disease. COMMON BRAND NAME(S): Feraheme What should I tell my health care provider before I take this medicine? They need to know if you have any of these conditions: -anemia not caused by low iron levels -high levels of iron in the blood -magnetic resonance imaging (MRI) test scheduled -an unusual or allergic reaction to iron, other medicines, foods, dyes, or preservatives -pregnant or trying to get pregnant -breast-feeding How should I use this medicine? This medicine is for injection into a vein. It is given by a health care professional in a hospital or clinic setting. Talk to your pediatrician regarding the use of this medicine in children. Special care may be needed. What if I miss a dose? It is important not to miss your dose. Call your doctor or health care professional if you are unable to keep an appointment. What may interact with this medicine? This medicine may interact with the following medications: -other iron products What should I watch for while using this medicine? Visit your doctor or healthcare professional regularly. Tell your doctor or healthcare professional if your symptoms do not start to get better or if they get worse. You may need blood work done while you are taking this medicine. You may need to follow a special diet. Talk to your doctor. Foods that contain iron include: whole grains/cereals, dried fruits, beans, or peas, leafy green vegetables, and organ meats (liver, kidney). What side effects may I notice from receiving this medicine? Side effects that you should report to your doctor or health care professional as soon as possible: -allergic reactions like skin rash, itching or hives, swelling of the  face, lips, or tongue -breathing problems -changes in blood pressure -feeling faint or lightheaded, falls -fever or chills -flushing, sweating, or hot feelings -swelling of the ankles or feet Side effects that usually do not require medical attention (report to your doctor or health care professional if they continue or are bothersome): -diarrhea -headache -nausea, vomiting -stomach pain Where should I keep my medicine? This drug is given in a hospital or clinic and will not be stored at home.  2017 Elsevier/Gold Standard (2015-09-03 12:41:49)  

## 2016-07-05 NOTE — Progress Notes (Signed)
Pt monitored 30 minutes post feraheme infusion. Pt and VS stable at discharge.

## 2016-07-12 ENCOUNTER — Ambulatory Visit (HOSPITAL_BASED_OUTPATIENT_CLINIC_OR_DEPARTMENT_OTHER): Payer: Commercial Managed Care - PPO

## 2016-07-12 VITALS — BP 100/58 | HR 64 | Temp 98.7°F | Resp 17

## 2016-07-12 DIAGNOSIS — D500 Iron deficiency anemia secondary to blood loss (chronic): Secondary | ICD-10-CM

## 2016-07-12 DIAGNOSIS — D509 Iron deficiency anemia, unspecified: Secondary | ICD-10-CM

## 2016-07-12 DIAGNOSIS — D539 Nutritional anemia, unspecified: Secondary | ICD-10-CM

## 2016-07-12 MED ORDER — SODIUM CHLORIDE 0.9 % IV SOLN
510.0000 mg | Freq: Once | INTRAVENOUS | Status: AC
  Administered 2016-07-12: 510 mg via INTRAVENOUS
  Filled 2016-07-12: qty 17

## 2016-07-12 NOTE — Patient Instructions (Signed)
Ferumoxytol injection What is this medicine? FERUMOXYTOL is an iron complex. Iron is used to make healthy red blood cells, which carry oxygen and nutrients throughout the body. This medicine is used to treat iron deficiency anemia in people with chronic kidney disease. COMMON BRAND NAME(S): Feraheme What should I tell my health care provider before I take this medicine? They need to know if you have any of these conditions: -anemia not caused by low iron levels -high levels of iron in the blood -magnetic resonance imaging (MRI) test scheduled -an unusual or allergic reaction to iron, other medicines, foods, dyes, or preservatives -pregnant or trying to get pregnant -breast-feeding How should I use this medicine? This medicine is for injection into a vein. It is given by a health care professional in a hospital or clinic setting. Talk to your pediatrician regarding the use of this medicine in children. Special care may be needed. What if I miss a dose? It is important not to miss your dose. Call your doctor or health care professional if you are unable to keep an appointment. What may interact with this medicine? This medicine may interact with the following medications: -other iron products What should I watch for while using this medicine? Visit your doctor or healthcare professional regularly. Tell your doctor or healthcare professional if your symptoms do not start to get better or if they get worse. You may need blood work done while you are taking this medicine. You may need to follow a special diet. Talk to your doctor. Foods that contain iron include: whole grains/cereals, dried fruits, beans, or peas, leafy green vegetables, and organ meats (liver, kidney). What side effects may I notice from receiving this medicine? Side effects that you should report to your doctor or health care professional as soon as possible: -allergic reactions like skin rash, itching or hives, swelling of the  face, lips, or tongue -breathing problems -changes in blood pressure -feeling faint or lightheaded, falls -fever or chills -flushing, sweating, or hot feelings -swelling of the ankles or feet Side effects that usually do not require medical attention (report to your doctor or health care professional if they continue or are bothersome): -diarrhea -headache -nausea, vomiting -stomach pain Where should I keep my medicine? This drug is given in a hospital or clinic and will not be stored at home.  2017 Elsevier/Gold Standard (2015-09-03 12:41:49)  

## 2016-07-13 ENCOUNTER — Other Ambulatory Visit: Payer: Commercial Managed Care - PPO

## 2016-07-14 ENCOUNTER — Encounter: Payer: Self-pay | Admitting: Internal Medicine

## 2016-07-14 ENCOUNTER — Telehealth: Payer: Self-pay | Admitting: *Deleted

## 2016-07-14 ENCOUNTER — Ambulatory Visit (AMBULATORY_SURGERY_CENTER): Payer: Commercial Managed Care - PPO | Admitting: Internal Medicine

## 2016-07-14 VITALS — BP 126/56 | HR 63 | Temp 98.4°F | Resp 18 | Ht 63.0 in | Wt 128.0 lb

## 2016-07-14 DIAGNOSIS — Z5181 Encounter for therapeutic drug level monitoring: Secondary | ICD-10-CM

## 2016-07-14 DIAGNOSIS — K2270 Barrett's esophagus without dysplasia: Secondary | ICD-10-CM

## 2016-07-14 DIAGNOSIS — K2950 Unspecified chronic gastritis without bleeding: Secondary | ICD-10-CM | POA: Diagnosis not present

## 2016-07-14 DIAGNOSIS — Z79899 Other long term (current) drug therapy: Secondary | ICD-10-CM

## 2016-07-14 DIAGNOSIS — Z9189 Other specified personal risk factors, not elsewhere classified: Secondary | ICD-10-CM

## 2016-07-14 DIAGNOSIS — K2970 Gastritis, unspecified, without bleeding: Secondary | ICD-10-CM | POA: Diagnosis not present

## 2016-07-14 MED ORDER — SODIUM CHLORIDE 0.9 % IV SOLN: 500.0000 mL | INTRAVENOUS | Status: DC

## 2016-07-14 NOTE — Progress Notes (Signed)
To recovery vss report to RN 

## 2016-07-14 NOTE — Telephone Encounter (Signed)
Patient had procedure today. I will contact her tomorrow regarding DEXA.

## 2016-07-14 NOTE — Telephone Encounter (Signed)
-----   Message from Jerene Bears, MD sent at 07/14/2016 10:58 AM EST ----- Regarding: scan Needs bone mineral density scan Chronic ppi with barrett's, eval for osteopenia/porosis JMP

## 2016-07-14 NOTE — Op Note (Signed)
[place] Patient Name: Meagan [name] Procedure Date: 07/14/2016 10:23 AM MRN: [id number] Endoscopist: Jerene Bears , MD Age: 55 Referring MD:  Date of Birth: Apr 26, 1961 Gender: Female Account #: [id number] Procedure:                Upper GI endoscopy Indications:              Follow-up of Barrett's esophagus, last EGD                            08/13/2013 Medicines:                Monitored Anesthesia Care Procedure:                Pre-Anesthesia Assessment:                           - Prior to the procedure, a History and Physical                            was performed, and patient medications and                            allergies were reviewed. The patient's tolerance of                            previous anesthesia was also reviewed. The risks                            and benefits of the procedure and the sedation                            options and risks were discussed with the patient.                            All questions were answered, and informed consent                            was obtained. Prior Anticoagulants: The patient has                            taken no previous anticoagulant or antiplatelet                            agents. ASA Grade Assessment: II - A patient with                            mild systemic disease. After reviewing the risks                            and benefits, the patient was deemed in                            satisfactory condition to undergo the procedure.  After obtaining informed consent, the endoscope was                            passed under direct vision. Throughout the                            procedure, the patient's blood pressure, pulse, and                            oxygen saturations were monitored continuously. The                            Model GIF-HQ190 [id number]) scope was introduced                            through the mouth, and advanced to the second part                           of duodenum. The upper GI endoscopy was                            accomplished without difficulty. The patient                            tolerated the procedure well. Scope In: Scope Out: Findings:                 The esophagus and gastroesophageal junction were                            examined with white light and narrow band imaging                            (NBI) from a forward view and retroflexed position.                            There were esophageal mucosal changes consistent                            with short-segment Barrett's esophagus. These                            changes involved the mucosa at the upper extent of                            the gastric folds (36 cm from the incisors)                            extending to the Z-line (34 cm from the incisors).                            Circumferential salmon-colored mucosa was present  at 37 cm, two tongues of salmon-colored mucosa were                            present from 34 to 36 cm, no visible abnormalities                            were present and hiatal narrowing was identified at                            40 cm (3 cm hiatal hernia). The maximum                            longitudinal extent of these esophageal mucosal                            changes was 3 cm in length. Mucosa was biopsied                            with a cold forceps for histology in a targeted                            manner and in 4 quadrants at intervals of 1.5 cm                            from 34 to 37 cm from the incisors. A total of 2                            specimen bottles were sent to pathology.                           The exam of the esophagus was otherwise normal.                           The entire examined stomach was normal. Biopsies                            were taken with a cold forceps for histology and                            Helicobacter pylori  testing given history fo H.                            Pylori.                           The examined duodenum was normal. Complications:            No immediate complications. Estimated Blood Loss:     Estimated blood loss was minimal. Impression:               - Esophageal mucosal changes consistent with  short-segment Barrett's esophagus. Biopsied.                           - 3 cm hiatal hernia.                           - Small, benign, fundic gland polyps in proximal                            stomach.                           - Normal stomach. Biopsied.                           - Normal examined duodenum. Recommendation:           - Patient has a contact number available for                            emergencies. The signs and symptoms of potential                            delayed complications were discussed with the                            patient. Return to normal activities tomorrow.                            Written discharge instructions were provided to the                            patient.                           - Resume previous diet.                           - Continue present medications.                           - Await pathology results.                           - Repeat upper endoscopy for surveillance based on                            pathology results. Jerene Bears, MD 07/14/2016 10:52:29 AM This report has been signed electronically.

## 2016-07-14 NOTE — Patient Instructions (Signed)
YOU HAD AN ENDOSCOPIC PROCEDURE TODAY AT [facility]:   Refer to the procedure report that was given to you for any specific questions about what was found during the examination.  If the procedure report does not answer your questions, please call your gastroenterologist to clarify.  If you requested that your care partner not be given the details of your procedure findings, then the procedure report has been included in a sealed envelope for you to review at your convenience later.  YOU SHOULD EXPECT: Some feelings of bloating in the abdomen. Passage of more gas than usual.  Walking can help get rid of the air that was put into your GI tract during the procedure and reduce the bloating. If you had a lower endoscopy (such as a colonoscopy or flexible sigmoidoscopy) you may notice spotting of blood in your stool or on the toilet paper. If you underwent a bowel prep for your procedure, you may not have a normal bowel movement for a few days.  Please Note:  You might notice some irritation and congestion in your nose or some drainage.  This is from the oxygen used during your procedure.  There is no need for concern and it should clear up in a day or so.  SYMPTOMS TO REPORT IMMEDIATELY:     Following upper endoscopy (EGD)  Vomiting of blood or coffee ground material  New chest pain or pain under the shoulder blades  Painful or persistently difficult swallowing  New shortness of breath  Fever of 100F or higher  Black, tarry-looking stools  For urgent or emergent issues, a gastroenterologist can be reached at any hour by calling [phone].   DIET:  We do recommend a small meal at first, but then you may proceed to your regular diet.  Drink plenty of fluids but you should avoid alcoholic beverages for 24 hours.  ACTIVITY:  You should plan to take it easy for the rest of today and you should NOT DRIVE or use heavy machinery until tomorrow (because of the sedation medicines  used during the test).    FOLLOW UP: Our staff will call the number listed on your records the next business day following your procedure to check on you and address any questions or concerns that you may have regarding the information given to you following your procedure. If we do not reach you, we will leave a message.  However, if you are feeling well and you are not experiencing any problems, there is no need to return our call.  We will assume that you have returned to your regular daily activities without incident.  If any biopsies were taken you will be contacted by phone or by letter within the next 1-3 weeks.  Please call us at [phone] if you have not heard about the biopsies in 3 weeks.    SIGNATURES/CONFIDENTIALITY: You and/or your care partner have signed paperwork which will be entered into your electronic medical record.  These signatures attest to the fact that that the information above on your After Visit Summary has been reviewed and is understood.  Full responsibility of the confidentiality of this discharge information lies with you and/or your care-partner.   Resume medications. Information given on Hiatal Hernia.

## 2016-07-14 NOTE — Progress Notes (Signed)
Called to room to assist during endoscopic procedure.  Patient ID and intended procedure confirmed with present staff. Received instructions for my participation in the procedure from the performing physician.  

## 2016-07-15 ENCOUNTER — Telehealth: Payer: Self-pay | Admitting: Internal Medicine

## 2016-07-15 ENCOUNTER — Other Ambulatory Visit: Payer: Self-pay

## 2016-07-15 ENCOUNTER — Telehealth: Payer: Self-pay

## 2016-07-15 ENCOUNTER — Telehealth: Payer: Self-pay | Admitting: *Deleted

## 2016-07-15 NOTE — Telephone Encounter (Signed)
  Follow up Call-  Call back number 07/14/2016  Post procedure Call Back phone  # [phone]  Permission to leave phone message Yes  Some recent data might be hidden     Patient states that her throat is sore from the endoscopy. When questioned, the patient is able to eat and drink fluids. I suggested that she eats a soft diet for a day or two and see if it helps the soreness. Patient is not in pain, but describes it as sore.    Patient questions:  Do you have a fever, pain , or abdominal swelling? No. Pain Score  1 *  Have you tolerated food without any problems? Yes.    Have you been able to return to your normal activities? Yes.    Do you have any questions about your discharge instructions: Diet   No. Medications  No. Follow up visit  No.  Do you have questions or concerns about your Care? No.  Actions: * If pain score is 4 or above: No action needed, pain <4.

## 2016-07-15 NOTE — Telephone Encounter (Signed)
No answer. No identifier. Message left that we will attempt to call later today. Call if any questions or concerns.

## 2016-07-15 NOTE — Telephone Encounter (Signed)
Left message for pt to call back  °

## 2016-07-15 NOTE — Telephone Encounter (Signed)
Patient has been scheduled for DEXA on 08/17/16 @ 2:30 pm (per her request). She verbalizes understanding to come to [facility] radiology 08/17/16.

## 2016-07-15 NOTE — Telephone Encounter (Signed)
Spoke with pt and she states that it is not happening all the time but when she eats anything that is not soft she has discomfort in her throat and in her back. She states she even feels this way when drinking liquids. She states it almost takes her breath. Please advise.

## 2016-07-15 NOTE — Telephone Encounter (Signed)
Per Dr. Hilarie Fredrickson pt needs to have a chest xray done and carafate prescribed for possible irritation from the biopsy that was done on her esophagus. Spoke with pt and she states that she is at the airport and leaving for [place] and cannot come for the xray. Pt wanted to know the name of the medication and she is going to see if she can get it once she gets to [place]. Dr. Hilarie Fredrickson aware.

## 2016-07-17 ENCOUNTER — Encounter: Payer: Self-pay | Admitting: Family Medicine

## 2016-07-20 ENCOUNTER — Other Ambulatory Visit: Payer: Commercial Managed Care - PPO

## 2016-07-20 ENCOUNTER — Ambulatory Visit: Payer: Commercial Managed Care - PPO | Admitting: Oncology

## 2016-07-22 ENCOUNTER — Encounter: Payer: Self-pay | Admitting: Internal Medicine

## 2016-07-26 ENCOUNTER — Encounter: Payer: Self-pay | Admitting: Family Medicine

## 2016-08-01 ENCOUNTER — Encounter: Payer: Commercial Managed Care - PPO | Admitting: Internal Medicine

## 2016-08-06 ENCOUNTER — Ambulatory Visit
Admission: RE | Admit: 2016-08-06 | Discharge: 2016-08-06 | Disposition: A | Discharge status: Home / Self Care | Payer: Commercial Managed Care - PPO | Source: Ambulatory Visit | Attending: Oncology | Admitting: Oncology

## 2016-08-06 DIAGNOSIS — Z170 Estrogen receptor positive status [ER+]: Principal | ICD-10-CM

## 2016-08-06 DIAGNOSIS — C50412 Malignant neoplasm of upper-outer quadrant of left female breast: Secondary | ICD-10-CM

## 2016-08-06 MED ORDER — GADOBENATE DIMEGLUMINE 529 MG/ML IV SOLN
11.0000 mL | Freq: Once | INTRAVENOUS | Status: AC | PRN
  Administered 2016-08-06: 11 mL via INTRAVENOUS

## 2016-08-07 ENCOUNTER — Other Ambulatory Visit: Payer: Self-pay | Admitting: Internal Medicine

## 2016-08-17 ENCOUNTER — Inpatient Hospital Stay: Admission: RE | Admit: 2016-08-17 | Payer: Commercial Managed Care - PPO | Source: Ambulatory Visit

## 2016-09-15 ENCOUNTER — Other Ambulatory Visit: Payer: Self-pay | Admitting: Internal Medicine

## 2016-09-22 ENCOUNTER — Telehealth: Payer: Self-pay | Admitting: Internal Medicine

## 2016-09-22 NOTE — Telephone Encounter (Signed)
Pt states she feels like there is something stuck in her throat. States she is able to eat and drink but when she swallows she feels like something is in her throat. Pt requests to be seen. Pt scheduled to see Alonza Bogus PA tomorrow at 3pm. Pt aware of appt.

## 2016-09-23 ENCOUNTER — Encounter: Payer: Self-pay | Admitting: Gastroenterology

## 2016-09-23 ENCOUNTER — Ambulatory Visit (INDEPENDENT_AMBULATORY_CARE_PROVIDER_SITE_OTHER): Payer: Commercial Managed Care - PPO | Admitting: Gastroenterology

## 2016-09-23 VITALS — BP 100/66 | HR 68 | Ht 63.0 in | Wt 128.4 lb

## 2016-09-23 DIAGNOSIS — K219 Gastro-esophageal reflux disease without esophagitis: Secondary | ICD-10-CM | POA: Diagnosis not present

## 2016-09-23 DIAGNOSIS — R0989 Other specified symptoms and signs involving the circulatory and respiratory systems: Secondary | ICD-10-CM

## 2016-09-23 DIAGNOSIS — K2270 Barrett's esophagus without dysplasia: Secondary | ICD-10-CM | POA: Diagnosis not present

## 2016-09-23 DIAGNOSIS — F458 Other somatoform disorders: Secondary | ICD-10-CM | POA: Diagnosis not present

## 2016-09-23 DIAGNOSIS — R198 Other specified symptoms and signs involving the digestive system and abdomen: Secondary | ICD-10-CM

## 2016-09-23 MED ORDER — PANTOPRAZOLE SODIUM 40 MG PO TBEC: 40.0000 mg | DELAYED_RELEASE_TABLET | Freq: Two times a day (BID) | ORAL | 3 refills | Status: DC

## 2016-09-23 NOTE — Patient Instructions (Signed)
Increase Pantoprazole 40 mg to twice a day.

## 2016-09-23 NOTE — Progress Notes (Addendum)
09/23/2016 Meagan [name] [id number] 08-15-1961   HISTORY OF PRESENT ILLNESS: A 56 year old female who is known to Dr. Hilarie Fredrickson. She is here today with her significant other, Randall Hiss, for complaints of sensation there is something in her throat. She tells me that for the past week she feels that there is something stuck in her throat/upper esophagus. She has been eating and drinking just fine. She is taking her Protonix 40 mg daily for her acid reflux. She had an EGD in November 2017 by Dr. Hilarie Fredrickson at which time she was found to have a 3 cm hiatal hernia, Barrett's esophagus (confirmed on biopsies), and fundic gland polyps (negative Hpylori on biopsies).   Past Medical History:  Diagnosis Date  . Arthritis of hand   . 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 Hilarie Fredrickson)  . Breast cancer, left ([facility]) 06/26/15   1 cm mass, + core bx; invasive ductal carcinoma: suggested tx plan was breast conservation surgery followed by HER-2 based therapy with accompanying chemotherapy, as well as radiation, followed by anti-estrogens.  However, as of 08/2015 f/u with Dr. Jana Hakim she has declined ALL chemo/med/radiation tx's and wants her port removed.  . Endometrial polyp   . Endometriosis   . Fibroids   . GERD (gastroesophageal reflux disease)    +Barretts esophagus on endo 09/2012  . H/O hiatal hernia   . Helicobacter pylori (H. pylori)    positive serology 07/2012  . History of iron deficiency anemia    pt does not tolerate oral iron; plan for feraheme per oncologist as of 06/2016  . Hyperlipidemia   . Menorrhagia    has led to iron def anemia  . Rosacea   . SAB (spontaneous abortion)   . Shortness of breath    on exertion  . Sleep apnea    no CPAP   Past Surgical History:  Procedure Laterality Date  . BREAST BIOPSY  06/29/15   Core needle: + invasive mammary carcinoma  . BREAST LUMPECTOMY WITH RADIOACTIVE SEED AND SENTINEL LYMPH NODE BIOPSY Left 07/28/2015   Path: invasive ductal  carcinoma, neg margins, sentinal LN x 3 neg for tumor.  Procedure: LEFT BREAST LUMPECTOMY WITH RADIOACTIVE SEED AND SENTINEL LYMPH NODE BIOPSY;  Surgeon: Erroll Luna, MD;  Location: [facility];  Service: General;  Laterality: Left;  . D & C HYSTEROSCOPY/ POLYPECTOMY/ DX LAPAROSCOPY/ LYSIS ADHESIONS/ ABLATION ENDOMETRIOSIS  04-10-2009  . DILATATION & CURETTAGE/HYSTEROSCOPY WITH TRUECLEAR N/A 03/29/2013   Procedure: DILATATION & CURETTAGE/HYSTEROSCOPY WITH TRUECLEAR;  Surgeon: Marylynn Pearson, MD;  Location: [facility];  Service: Gynecology;  Laterality: N/A;  . DILATION AND CURETTAGE OF UTERUS    . ESOPHAGOGASTRODUODENOSCOPY  10/01/12; 06/2016   chronic inactive gastritis (H. pylori neg, no dysplasia or malig),+ Barretts esophagus (Dr. Hilarie Fredrickson).  2017 repeat showed Barrett's esoph, o/w normal.  Repeat 3 yrs recommended.  . EXP. LAP. LEFT OVARIAN CYSTECTOMY / LYSIS ADHESIONS/ MYOMECTOMY  2008  . HYSTEROSCOPY  2010  . HYSTEROSCOPY W/D&C  08/25/2011   Procedure: DILATATION AND CURETTAGE /HYSTEROSCOPY;  Surgeon: Marylynn Pearson;  Location: [facility];  Service: Gynecology;;  . MYOMECTOMY    . MYOMECTOMY ABDOMINAL APPROACH    . POLYPECTOMY  2013   ENDOMETRIAL POLYP  . PORTACATH PLACEMENT Right 07/28/2015   Procedure: INSERTION PORT-A-CATH WITH ULTRA SOUND;  Surgeon: Erroll Luna, MD;  Location: [facility];  Service: General;  Laterality: Right;  . WISDOM TOOTH EXTRACTION      reports  that she has never smoked. She has never used smokeless tobacco. She reports that she does not drink alcohol or use drugs. family history includes Anuerysm in her father; Diabetes in her other; Fibroids in her sister; Heart disease in her father, maternal aunt, maternal uncle, mother, and paternal uncle; Hypertension in her father; Prostate cancer in her father; Stomach cancer in her other; Uterine cancer in her maternal grandmother. No Known Allergies    Outpatient Encounter  Prescriptions as of 09/23/2016  Medication Sig  . FIBER SELECT GUMMIES CHEW Chew 2 capsules by mouth daily.  . pantoprazole (PROTONIX) 40 MG tablet TAKE 1 TABLET BY MOUTH EVERY DAY  . pantoprazole (PROTONIX) 40 MG tablet Take 1 tablet (40 mg total) by mouth 2 (two) times daily.  . [DISCONTINUED] Multiple Vitamins-Iron (MULTIVITAMINS WITH IRON) TABS tablet Take 1 tablet by mouth daily.   Facility-Administered Encounter Medications as of 09/23/2016  Medication  . 0.9 %  sodium chloride infusion     REVIEW OF SYSTEMS  : All other systems reviewed and negative except where noted in the History of Present Illness.   PHYSICAL EXAM: BP 100/66   Pulse 68   Ht '5\' 3"'$  (1.6 m)   Wt 128 lb 6.4 oz (58.2 kg)   LMP 09/14/2016   BMI 22.75 kg/m  General: Well developed female in no acute distress Head: Normocephalic and atraumatic Eyes:  Sclerae anicteric, conjunctiva pink. Ears: Normal auditory acuity Lungs: Clear throughout to auscultation Heart: Regular rate and rhythm Abdomen: Soft, non-distended. Normal bowel sounds.  Non-tender. Musculoskeletal: Symmetrical with no gross deformities  Skin: No lesions on visible extremities Extremities: No edema  Neurological: Alert oriented x 4, grossly non-focal Psychological:  Alert and cooperative. Normal mood and affect  ASSESSMENT AND PLAN: -Globus sensation:  Sensation that there is something in her throat/esophagus.  May be from reflux.  I do not think that she has something stuck as she has been tolerating food and liquids without issues.  Will increase pantoprazole 40 mg to BID.  I offered her an esophagram but she declined for now; will wait to see if the increased PPI helps and sensation resolves.   CC:  McGowen, Adrian Blackwater, MD   Addendum: Reviewed and agree with management. Jerene Bears, MD

## 2016-09-28 IMAGING — CR DG CHEST 1V PORT
1 series · 1 of 1 positions shown · non-contrast
Comparison: None

CLINICAL DATA: Status post port placement

EXAM:
PORTABLE CHEST - 1 VIEW

[AP]
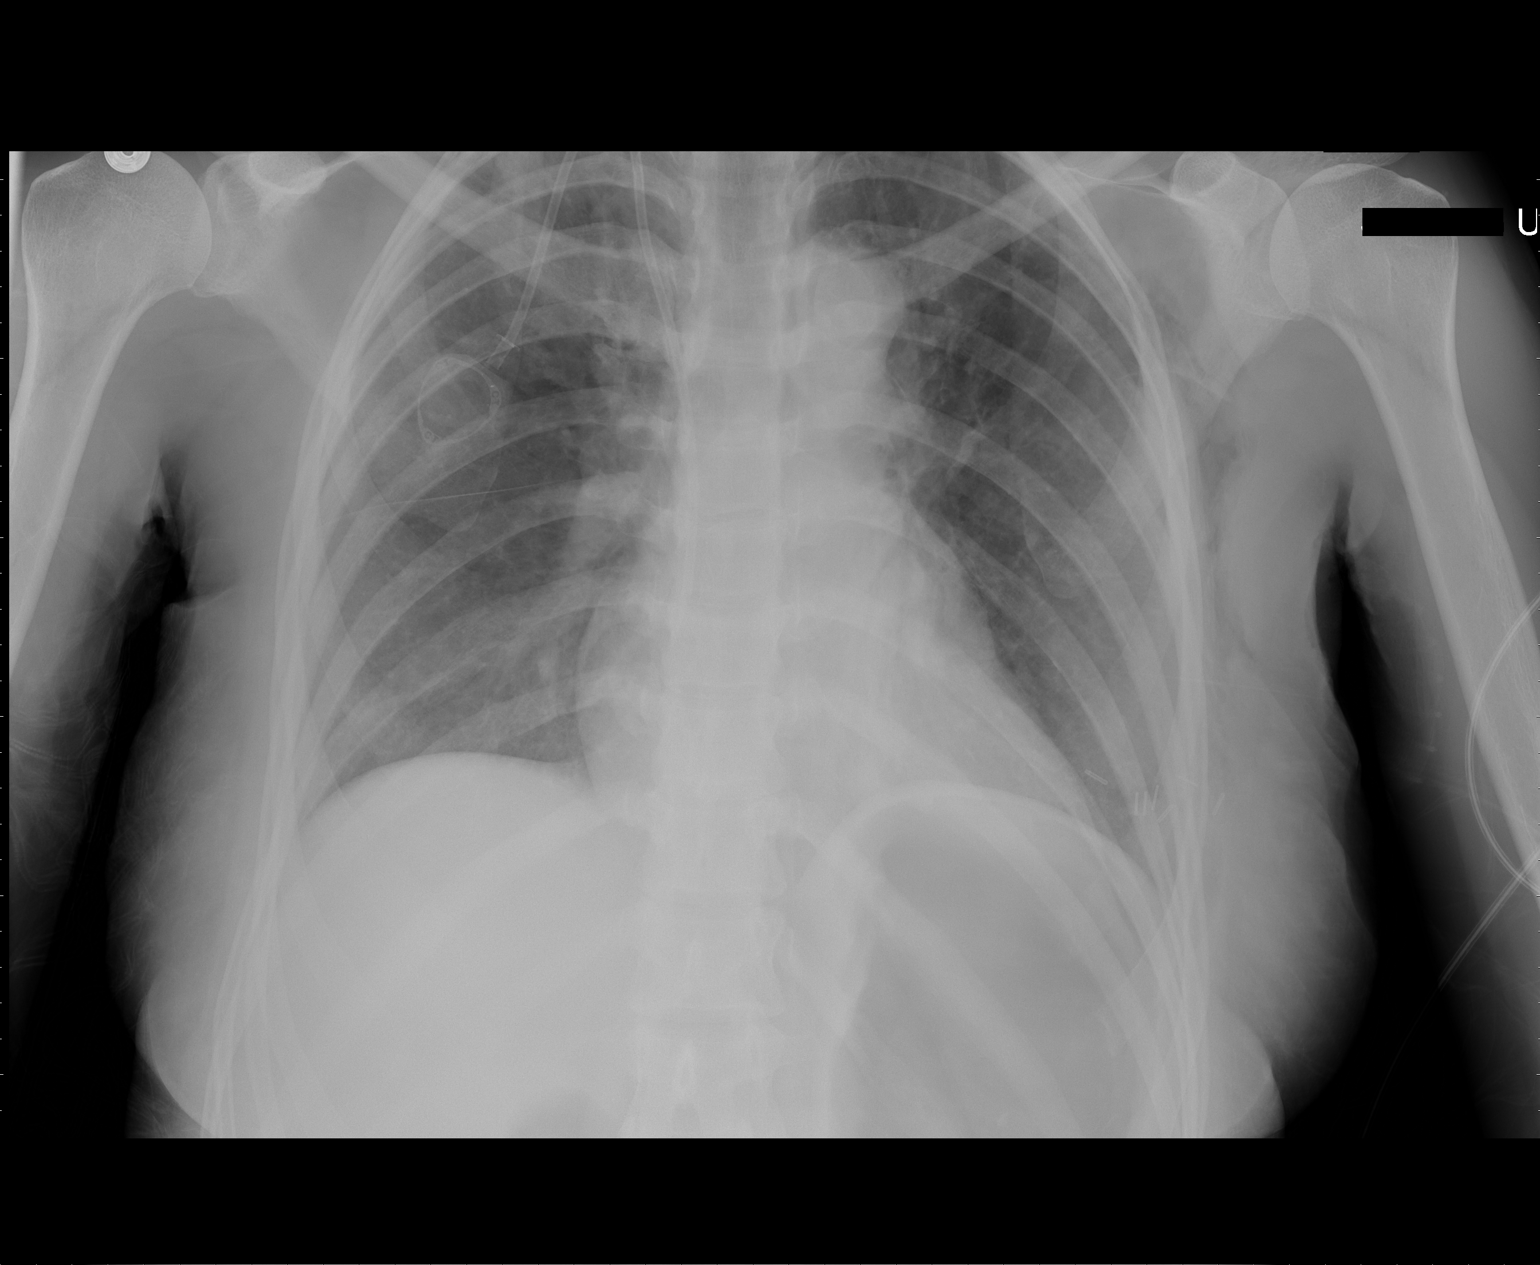

[1 of 1 positions shown; findings below may reference images not displayed]

FINDINGS: Cardiac shadow is within normal limits. A right-sided chest wall
port is seen with the catheter tip at the cavoatrial junction. No
pneumothorax is seen. Postsurgical changes in the left breast are
noted.
IMPRESSION: No pneumothorax following port placement

## 2016-11-07 ENCOUNTER — Ambulatory Visit: Payer: Commercial Managed Care - PPO | Admitting: Internal Medicine

## 2016-12-26 ENCOUNTER — Other Ambulatory Visit: Payer: Self-pay

## 2016-12-26 DIAGNOSIS — C50412 Malignant neoplasm of upper-outer quadrant of left female breast: Secondary | ICD-10-CM

## 2016-12-26 DIAGNOSIS — Z170 Estrogen receptor positive status [ER+]: Principal | ICD-10-CM

## 2016-12-27 ENCOUNTER — Other Ambulatory Visit (HOSPITAL_BASED_OUTPATIENT_CLINIC_OR_DEPARTMENT_OTHER): Payer: Commercial Managed Care - PPO

## 2016-12-27 DIAGNOSIS — D500 Iron deficiency anemia secondary to blood loss (chronic): Secondary | ICD-10-CM

## 2016-12-27 DIAGNOSIS — D509 Iron deficiency anemia, unspecified: Secondary | ICD-10-CM

## 2016-12-27 DIAGNOSIS — C50412 Malignant neoplasm of upper-outer quadrant of left female breast: Secondary | ICD-10-CM

## 2016-12-27 DIAGNOSIS — Z170 Estrogen receptor positive status [ER+]: Principal | ICD-10-CM

## 2016-12-27 LAB — COMPREHENSIVE METABOLIC PANEL
ALBUMIN: 3.9 g/dL (ref 3.5–5.0)
ALK PHOS: 60 U/L (ref 40–150)
ALT: 12 U/L (ref 0–55)
ANION GAP: 8 meq/L (ref 3–11)
AST: 15 U/L (ref 5–34)
BUN: 9.7 mg/dL (ref 7.0–26.0)
CO2: 26 mEq/L (ref 22–29)
Calcium: 9.1 mg/dL (ref 8.4–10.4)
Chloride: 104 mEq/L (ref 98–109)
Creatinine: 0.9 mg/dL (ref 0.6–1.1)
EGFR: 71 mL/min/{1.73_m2} — AB (ref >90)
GLUCOSE: 96 mg/dL (ref 70–140)
Potassium: 4.2 mEq/L (ref 3.5–5.1)
SODIUM: 139 meq/L (ref 136–145)
Total Bilirubin: 0.71 mg/dL (ref 0.20–1.20)
Total Protein: 6.8 g/dL (ref 6.4–8.3)

## 2016-12-27 LAB — CBC WITH DIFFERENTIAL/PLATELET
BASO%: 0.9 % (ref 0.0–2.0)
BASOS ABS: 0.1 10*3/uL (ref 0.0–0.1)
EOS ABS: 0.2 10*3/uL (ref 0.0–0.5)
EOS%: 4.2 % (ref 0.0–7.0)
HCT: 32.4 % — ABNORMAL LOW (ref 34.8–46.6)
HGB: 10.8 g/dL — ABNORMAL LOW (ref 11.6–15.9)
LYMPH%: 20.6 % (ref 14.0–49.7)
MCH: 28.9 pg (ref 25.1–34.0)
MCHC: 33.4 g/dL (ref 31.5–36.0)
MCV: 86.6 fL (ref 79.5–101.0)
MONO#: 0.4 10*3/uL (ref 0.1–0.9)
MONO%: 7.6 % (ref 0.0–14.0)
NEUT#: 3.8 10*3/uL (ref 1.5–6.5)
NEUT%: 66.7 % (ref 38.4–76.8)
Platelets: 218 10*3/uL (ref 145–400)
RBC: 3.75 10*6/uL (ref 3.70–5.45)
RDW: 13.1 % (ref 11.2–14.5)
WBC: 5.8 10*3/uL (ref 3.9–10.3)
lymph#: 1.2 10*3/uL (ref 0.9–3.3)

## 2016-12-27 LAB — DRAW EXTRA CLOT TUBE

## 2016-12-27 LAB — FERRITIN: Ferritin: 5 ng/ml — ABNORMAL LOW (ref 9–269)

## 2017-01-02 ENCOUNTER — Other Ambulatory Visit: Payer: Self-pay | Admitting: Oncology

## 2017-01-10 ENCOUNTER — Telehealth: Payer: Self-pay

## 2017-01-10 NOTE — Telephone Encounter (Signed)
Spoke with pt by phone regarding most recent ferritin results.  Pt agreeable to additional feraheme infusions.  Msg sent to scheduling to schedule 2 infusions one week apart.

## 2017-01-20 ENCOUNTER — Ambulatory Visit: Payer: Commercial Managed Care - PPO

## 2017-01-27 ENCOUNTER — Other Ambulatory Visit: Payer: Self-pay | Admitting: Oncology

## 2017-01-27 ENCOUNTER — Ambulatory Visit (HOSPITAL_BASED_OUTPATIENT_CLINIC_OR_DEPARTMENT_OTHER): Payer: Commercial Managed Care - PPO

## 2017-01-27 VITALS — BP 102/64 | HR 66 | Temp 97.9°F | Resp 16

## 2017-01-27 DIAGNOSIS — D509 Iron deficiency anemia, unspecified: Secondary | ICD-10-CM | POA: Diagnosis not present

## 2017-01-27 DIAGNOSIS — D539 Nutritional anemia, unspecified: Secondary | ICD-10-CM

## 2017-01-27 DIAGNOSIS — D500 Iron deficiency anemia secondary to blood loss (chronic): Secondary | ICD-10-CM

## 2017-01-27 MED ORDER — SODIUM CHLORIDE 0.9 % IV SOLN
510.0000 mg | Freq: Once | INTRAVENOUS | Status: AC
  Administered 2017-01-27: 510 mg via INTRAVENOUS
  Filled 2017-01-27: qty 17

## 2017-01-27 NOTE — Patient Instructions (Signed)
Ferumoxytol injection What is this medicine? FERUMOXYTOL is an iron complex. Iron is used to make healthy red blood cells, which carry oxygen and nutrients throughout the body. This medicine is used to treat iron deficiency anemia in people with chronic kidney disease. COMMON BRAND NAME(S): Feraheme What should I tell my health care provider before I take this medicine? They need to know if you have any of these conditions: -anemia not caused by low iron levels -high levels of iron in the blood -magnetic resonance imaging (MRI) test scheduled -an unusual or allergic reaction to iron, other medicines, foods, dyes, or preservatives -pregnant or trying to get pregnant -breast-feeding How should I use this medicine? This medicine is for injection into a vein. It is given by a health care professional in a hospital or clinic setting. Talk to your pediatrician regarding the use of this medicine in children. Special care may be needed. What if I miss a dose? It is important not to miss your dose. Call your doctor or health care professional if you are unable to keep an appointment. What may interact with this medicine? This medicine may interact with the following medications: -other iron products What should I watch for while using this medicine? Visit your doctor or healthcare professional regularly. Tell your doctor or healthcare professional if your symptoms do not start to get better or if they get worse. You may need blood work done while you are taking this medicine. You may need to follow a special diet. Talk to your doctor. Foods that contain iron include: whole grains/cereals, dried fruits, beans, or peas, leafy green vegetables, and organ meats (liver, kidney). What side effects may I notice from receiving this medicine? Side effects that you should report to your doctor or health care professional as soon as possible: -allergic reactions like skin rash, itching or hives, swelling of the  face, lips, or tongue -breathing problems -changes in blood pressure -feeling faint or lightheaded, falls -fever or chills -flushing, sweating, or hot feelings -swelling of the ankles or feet Side effects that usually do not require medical attention (report to your doctor or health care professional if they continue or are bothersome): -diarrhea -headache -nausea, vomiting -stomach pain Where should I keep my medicine? This drug is given in a hospital or clinic and will not be stored at home.  2017 Elsevier/Gold Standard (2015-09-03 12:41:49)  

## 2017-02-03 ENCOUNTER — Ambulatory Visit (HOSPITAL_BASED_OUTPATIENT_CLINIC_OR_DEPARTMENT_OTHER): Payer: Commercial Managed Care - PPO

## 2017-02-03 VITALS — BP 100/63 | HR 60 | Temp 97.8°F | Resp 16

## 2017-02-03 DIAGNOSIS — D509 Iron deficiency anemia, unspecified: Secondary | ICD-10-CM | POA: Diagnosis not present

## 2017-02-03 DIAGNOSIS — D539 Nutritional anemia, unspecified: Secondary | ICD-10-CM

## 2017-02-03 DIAGNOSIS — D500 Iron deficiency anemia secondary to blood loss (chronic): Secondary | ICD-10-CM

## 2017-02-03 MED ORDER — SODIUM CHLORIDE 0.9 % IV SOLN
510.0000 mg | Freq: Once | INTRAVENOUS | Status: AC
  Administered 2017-02-03: 510 mg via INTRAVENOUS
  Filled 2017-02-03: qty 17

## 2017-02-03 NOTE — Patient Instructions (Signed)

## 2017-03-30 ENCOUNTER — Other Ambulatory Visit: Payer: Self-pay | Admitting: Internal Medicine

## 2017-04-04 ENCOUNTER — Other Ambulatory Visit: Payer: Self-pay | Admitting: *Deleted

## 2017-04-04 ENCOUNTER — Telehealth: Payer: Self-pay | Admitting: Internal Medicine

## 2017-04-04 ENCOUNTER — Other Ambulatory Visit: Payer: Self-pay | Admitting: Internal Medicine

## 2017-04-04 NOTE — Telephone Encounter (Signed)
Advised patient that rx was sent on 03/30/17. She states they tell her they do not have a prescription for her. I contacted pharmacy and they state they do have 2 prescriptions on file but that a prior authorization is needed. They have not requested this because they would have had to send a "request manually." I have requested authorization through [email or web address] and am awaiting insurance response. Patient has been advised of this and verbalizes understanding.

## 2017-04-06 NOTE — Telephone Encounter (Signed)
We have received notice from [employer] that patient's pantoprazole 40 mg twice daily has been approved until 04/04/2022. Reference #: [id number].

## 2017-06-06 ENCOUNTER — Encounter (HOSPITAL_COMMUNITY): Payer: Self-pay | Admitting: Emergency Medicine

## 2017-06-06 ENCOUNTER — Emergency Department (HOSPITAL_COMMUNITY): Payer: Commercial Managed Care - PPO

## 2017-06-06 ENCOUNTER — Observation Stay (HOSPITAL_COMMUNITY)
Admission: EM | Admit: 2017-06-06 | Discharge: 2017-06-08 | Disposition: A | Discharge status: Home / Self Care | Payer: Commercial Managed Care - PPO | Attending: General Surgery | Admitting: General Surgery

## 2017-06-06 DIAGNOSIS — Z853 Personal history of malignant neoplasm of breast: Secondary | ICD-10-CM | POA: Diagnosis not present

## 2017-06-06 DIAGNOSIS — K449 Diaphragmatic hernia without obstruction or gangrene: Secondary | ICD-10-CM | POA: Insufficient documentation

## 2017-06-06 DIAGNOSIS — K5900 Constipation, unspecified: Secondary | ICD-10-CM | POA: Insufficient documentation

## 2017-06-06 DIAGNOSIS — Z9221 Personal history of antineoplastic chemotherapy: Secondary | ICD-10-CM | POA: Diagnosis not present

## 2017-06-06 DIAGNOSIS — B9681 Helicobacter pylori [H. pylori] as the cause of diseases classified elsewhere: Secondary | ICD-10-CM | POA: Insufficient documentation

## 2017-06-06 DIAGNOSIS — K219 Gastro-esophageal reflux disease without esophagitis: Secondary | ICD-10-CM | POA: Insufficient documentation

## 2017-06-06 DIAGNOSIS — R109 Unspecified abdominal pain: Secondary | ICD-10-CM | POA: Diagnosis present

## 2017-06-06 DIAGNOSIS — K37 Unspecified appendicitis: Secondary | ICD-10-CM | POA: Diagnosis present

## 2017-06-06 DIAGNOSIS — G4730 Sleep apnea, unspecified: Secondary | ICD-10-CM | POA: Diagnosis not present

## 2017-06-06 DIAGNOSIS — K3589 Other acute appendicitis without perforation or gangrene: Secondary | ICD-10-CM

## 2017-06-06 DIAGNOSIS — Z923 Personal history of irradiation: Secondary | ICD-10-CM | POA: Insufficient documentation

## 2017-06-06 DIAGNOSIS — K3533 Acute appendicitis with perforation and localized peritonitis, with abscess: Secondary | ICD-10-CM | POA: Diagnosis not present

## 2017-06-06 DIAGNOSIS — R1030 Lower abdominal pain, unspecified: Secondary | ICD-10-CM

## 2017-06-06 LAB — COMPREHENSIVE METABOLIC PANEL
ALK PHOS: 58 U/L (ref 38–126)
ALT: 14 U/L (ref 14–54)
AST: 19 U/L (ref 15–41)
Albumin: 4.3 g/dL (ref 3.5–5.0)
Anion gap: 11 (ref 5–15)
BUN: 15 mg/dL (ref 6–20)
CALCIUM: 9.3 mg/dL (ref 8.9–10.3)
CHLORIDE: 103 mmol/L (ref 101–111)
CO2: 21 mmol/L — AB (ref 22–32)
CREATININE: 0.79 mg/dL (ref 0.44–1.00)
GFR calc non Af Amer: >60 mL/min (ref >60)
Glucose, Bld: 117 mg/dL — ABNORMAL HIGH (ref 65–99)
Potassium: 3.9 mmol/L (ref 3.5–5.1)
SODIUM: 135 mmol/L (ref 135–145)
Total Bilirubin: 0.9 mg/dL (ref 0.3–1.2)
Total Protein: 7.5 g/dL (ref 6.5–8.1)

## 2017-06-06 LAB — CBC
HCT: 34 % — ABNORMAL LOW (ref 36.0–46.0)
Hemoglobin: 11.6 g/dL — ABNORMAL LOW (ref 12.0–15.0)
MCH: 29.7 pg (ref 26.0–34.0)
MCHC: 34.1 g/dL (ref 30.0–36.0)
MCV: 87.2 fL (ref 78.0–100.0)
PLATELETS: 258 10*3/uL (ref 150–400)
RBC: 3.9 MIL/uL (ref 3.87–5.11)
RDW: 12.6 % (ref 11.5–15.5)
WBC: 17.6 10*3/uL — ABNORMAL HIGH (ref 4.0–10.5)

## 2017-06-06 LAB — URINALYSIS, ROUTINE W REFLEX MICROSCOPIC
BILIRUBIN URINE: NEGATIVE
Bacteria, UA: NONE SEEN
Glucose, UA: NEGATIVE mg/dL
Hgb urine dipstick: NEGATIVE
KETONES UR: 80 mg/dL — AB
Leukocytes, UA: NEGATIVE
Nitrite: NEGATIVE
PH: 5 (ref 5.0–8.0)
PROTEIN: 30 mg/dL — AB
SPECIFIC GRAVITY, URINE: 1.026 (ref 1.005–1.030)

## 2017-06-06 LAB — I-STAT BETA HCG BLOOD, ED (MC, WL, AP ONLY): I-stat hCG, quantitative: <5 m[IU]/mL (ref <5)

## 2017-06-06 LAB — LIPASE, BLOOD: LIPASE: 28 U/L (ref 11–51)

## 2017-06-06 LAB — POC OCCULT BLOOD, ED: Fecal Occult Bld: NEGATIVE

## 2017-06-06 MED ORDER — SODIUM CHLORIDE 0.9 % IV BOLUS (SEPSIS)
1000.0000 mL | Freq: Once | INTRAVENOUS | Status: AC
  Administered 2017-06-06: 1000 mL via INTRAVENOUS

## 2017-06-06 MED ORDER — IOPAMIDOL (ISOVUE-300) INJECTION 61%
INTRAVENOUS | Status: AC
  Administered 2017-06-06: 100 mL via INTRAVENOUS
  Filled 2017-06-06: qty 100

## 2017-06-06 MED ORDER — ONDANSETRON 4 MG PO TBDP
4.0000 mg | ORAL_TABLET | Freq: Once | ORAL | Status: AC | PRN
  Administered 2017-06-06: 4 mg via ORAL
  Filled 2017-06-06: qty 1

## 2017-06-06 MED ORDER — MORPHINE SULFATE (PF) 4 MG/ML IV SOLN
4.0000 mg | Freq: Once | INTRAVENOUS | Status: AC
  Administered 2017-06-06: 4 mg via INTRAVENOUS
  Filled 2017-06-06: qty 1

## 2017-06-06 MED ORDER — IOPAMIDOL (ISOVUE-300) INJECTION 61%
100.0000 mL | Freq: Once | INTRAVENOUS | Status: AC | PRN
  Administered 2017-06-06: 100 mL via INTRAVENOUS

## 2017-06-06 MED ORDER — ONDANSETRON HCL 4 MG/2ML IJ SOLN
4.0000 mg | Freq: Once | INTRAMUSCULAR | Status: AC
  Administered 2017-06-06: 4 mg via INTRAVENOUS
  Filled 2017-06-06: qty 2

## 2017-06-06 NOTE — ED Triage Notes (Signed)
Patient c/o abd pain and back pain that started today. Patient thought was constipated so drank mag citrate and used suppository.  Patient started vomited and nausea got worse after taking medications.

## 2017-06-06 NOTE — ED Provider Notes (Signed)
[facility] DEPT Provider Note   CSN: [id number] Arrival date & time: 06/06/17  1811     History   Chief Complaint Chief Complaint  Patient presents with  . Abdominal Pain  . Back Pain  . Emesis    HPI Meagan [name] is a 56 y.o. female with a hx of barrett esophagus, breast cancer, endometriosis, fibroids, menorrhagia with anemia presents to the Emergency Department complaining of gradual, persistent, progressively worsening generalized abdominal pain, abd distension onset earlier today.  Pt reports associated NBNB emesis x2.  She also states she ate a salad last night that "tasted funny."  Pt reports she is chronically constipated with BMs 2x per week.  Associated symptoms include lower back pain x 3 months that has worsened today. Pt's fbroids are managed by her OB/GYN. She denies vaginal discharge, vaginal bleeding.  Pt with multiple abd surgeries for fibroids and endometriosis, no hx of SBO.  Nothing makes it better and nothing makes it worse.  Pt denies fever, chills, headache, neck pain chest pain, SOB, weakness, dizziness, syncope, melena, hematochezia.       The history is provided by the patient and medical records. No language interpreter was used.    Past Medical History:  Diagnosis Date  . Arthritis of hand   . 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 Hilarie Fredrickson)  . Breast cancer, left ([facility]) 06/26/15   1 cm mass, + core bx; invasive ductal carcinoma: suggested tx plan was breast conservation surgery followed by HER-2 based therapy with accompanying chemotherapy, as well as radiation, followed by anti-estrogens.  However, as of 08/2015 f/u with Dr. Jana Hakim she has declined ALL chemo/med/radiation tx's and wants her port removed.  . Endometrial polyp   . Endometriosis   . Fibroids   . GERD (gastroesophageal reflux disease)    +Barretts esophagus on endo 09/2012  . H/O hiatal hernia   . Helicobacter pylori (H. pylori)    positive serology 07/2012  . History of iron deficiency anemia    pt does not tolerate oral iron; plan for feraheme per oncologist as of 06/2016  . Hyperlipidemia   . Menorrhagia    has led to iron def anemia  . Rosacea   . SAB (spontaneous abortion)   . Shortness of breath    on exertion  . Sleep apnea    no CPAP    Patient Active Problem List   Diagnosis Date Noted  . Globus sensation 09/23/2016  . Gastroesophageal reflux disease without esophagitis 09/23/2016  . Iron deficiency anemia 06/29/2016  . Iron deficiency anemia due to chronic blood loss 06/29/2016  . Breast cancer of upper-outer quadrant of left female breast ([facility]) 07/14/2015  . Deficiency anemia 12/25/2014  . Barrett's esophagus 10/05/2012  . Gastritis 08/09/2012  . Constipation, chronic 08/09/2012  . Hyperlipidemia 08/01/2012  . Helicobacter pylori (H. pylori)     Past Surgical History:  Procedure Laterality Date  . BREAST BIOPSY  06/29/15   Core needle: + invasive mammary carcinoma  . BREAST LUMPECTOMY WITH RADIOACTIVE SEED AND SENTINEL LYMPH NODE BIOPSY Left 07/28/2015   Path: invasive ductal carcinoma, neg margins, sentinal LN x 3 neg for tumor.  Procedure: LEFT BREAST LUMPECTOMY WITH RADIOACTIVE SEED AND SENTINEL LYMPH NODE BIOPSY;  Surgeon: Erroll Luna, MD;  Location: [facility];  Service: General;  Laterality: Left;  . D & C HYSTEROSCOPY/ POLYPECTOMY/ DX LAPAROSCOPY/ LYSIS ADHESIONS/ ABLATION ENDOMETRIOSIS  04-10-2009  . DILATATION & CURETTAGE/HYSTEROSCOPY WITH TRUECLEAR  N/A 03/29/2013   Procedure: DILATATION & CURETTAGE/HYSTEROSCOPY WITH TRUECLEAR;  Surgeon: Marylynn Pearson, MD;  Location: [facility];  Service: Gynecology;  Laterality: N/A;  . DILATION AND CURETTAGE OF UTERUS    . ESOPHAGOGASTRODUODENOSCOPY  10/01/12; 06/2016   chronic inactive gastritis (H. pylori neg, no dysplasia or malig),+ Barretts esophagus (Dr. Hilarie Fredrickson).  2017 repeat showed Barrett's esoph, o/w normal.  Repeat 3 yrs  recommended.  . EXP. LAP. LEFT OVARIAN CYSTECTOMY / LYSIS ADHESIONS/ MYOMECTOMY  2008  . HYSTEROSCOPY  2010  . HYSTEROSCOPY W/D&C  08/25/2011   Procedure: DILATATION AND CURETTAGE /HYSTEROSCOPY;  Surgeon: Marylynn Pearson;  Location: [facility];  Service: Gynecology;;  . MYOMECTOMY    . MYOMECTOMY ABDOMINAL APPROACH    . POLYPECTOMY  2013   ENDOMETRIAL POLYP  . PORTACATH PLACEMENT Right 07/28/2015   Procedure: INSERTION PORT-A-CATH WITH ULTRA SOUND;  Surgeon: Erroll Luna, MD;  Location: [facility];  Service: General;  Laterality: Right;  . WISDOM TOOTH EXTRACTION      OB History    Gravida Para Term Preterm AB Living   10       10 0   SAB TAB Ectopic Multiple Live Births   10               Home Medications    Prior to Admission medications   Medication Sig Start Date End Date Taking? Authorizing Provider  acetaminophen (TYLENOL) 325 MG tablet Take 650 mg by mouth every 6 (six) hours as needed for mild pain or moderate pain.   Yes [provider]  FIBER SELECT GUMMIES CHEW Chew 2 capsules by mouth daily.   Yes [provider]  pantoprazole (PROTONIX) 40 MG tablet Take 1 tablet (40 mg total) by mouth 2 (two) times daily. Patient taking differently: Take 40 mg by mouth daily after breakfast.  03/30/17  Yes Pyrtle, Lajuan Lines, MD    Family History Family History  Problem Relation Age of Onset  . Prostate cancer Father   . Hypertension Father   . Anuerysm Father   . Heart disease Father   . Heart disease Mother   . Fibroids Sister        uterine  . Uterine cancer Maternal Grandmother   . Heart disease Maternal Aunt   . Heart disease Maternal Uncle   . Heart disease Paternal Uncle   . Stomach cancer Other        cousin  . Diabetes Other        half sister and brother  . Esophageal cancer Neg Hx   . Colon cancer Neg Hx     Social History Social History  Substance Use Topics  . Smoking status: Never Smoker  . Smokeless  tobacco: Never Used  . Alcohol use No     Allergies   Patient has no known allergies.   Review of Systems Review of Systems  Constitutional: Negative for appetite change, diaphoresis, fatigue, fever and unexpected weight change.  HENT: Negative for mouth sores.   Eyes: Negative for visual disturbance.  Respiratory: Negative for cough, chest tightness, shortness of breath and wheezing.   Cardiovascular: Negative for chest pain.  Gastrointestinal: Positive for abdominal pain and vomiting. Negative for constipation, diarrhea and nausea.  Endocrine: Negative for polydipsia, polyphagia and polyuria.  Genitourinary: Negative for dysuria, frequency, hematuria and urgency.  Musculoskeletal: Positive for back pain. Negative for neck stiffness.  Skin: Negative for rash.  Allergic/Immunologic: Negative for immunocompromised state.  Neurological: Negative for syncope,  light-headedness and headaches.  Hematological: Does not bruise/bleed easily.  Psychiatric/Behavioral: Negative for sleep disturbance. The patient is not nervous/anxious.      Physical Exam Updated Vital Signs BP 114/75 (BP Location: Right Arm)   Pulse 69   Temp 97.7 F (36.5 C) (Oral)   Resp 14   Ht '5\' 3"'$  (1.6 m)   Wt 59 kg (130 lb)   SpO2 100%   BMI 23.03 kg/m   Physical Exam  Constitutional: She appears well-developed and well-nourished. No distress.  Awake, alert, nontoxic appearance  HENT:  Head: Normocephalic and atraumatic.  Mouth/Throat: Oropharynx is clear and moist. No oropharyngeal exudate.  Eyes: Conjunctivae are normal. No scleral icterus.  Neck: Normal range of motion. Neck supple.  Cardiovascular: Normal rate, regular rhythm and intact distal pulses.   Pulmonary/Chest: Effort normal and breath sounds normal. No respiratory distress. She has no wheezes.  Equal chest expansion  Abdominal: Soft. Bowel sounds are normal. She exhibits distension. She exhibits no mass. There is tenderness in the right  lower quadrant, suprapubic area and left lower quadrant. There is guarding. There is no rigidity, no rebound, no CVA tenderness and negative Murphy's sign.    Musculoskeletal: Normal range of motion. She exhibits no edema.  Neurological: She is alert.  Speech is clear and goal oriented Moves extremities without ataxia  Skin: Skin is warm and dry. She is not diaphoretic.  Psychiatric: She has a normal mood and affect.  Nursing note and vitals reviewed.    ED Treatments / Results  Labs (all labs ordered are listed, but only abnormal results are displayed) Labs Reviewed  COMPREHENSIVE METABOLIC PANEL - Abnormal; Notable for the following:       Result Value   CO2 21 (*)    Glucose, Bld 117 (*)    All other components within normal limits  CBC - Abnormal; Notable for the following:    WBC 17.6 (*)    Hemoglobin 11.6 (*)    HCT 34.0 (*)    All other components within normal limits  URINALYSIS, ROUTINE W REFLEX MICROSCOPIC - Abnormal; Notable for the following:    Ketones, ur 80 (*)    Protein, ur 30 (*)    Squamous Epithelial / LPF 0-5 (*)    All other components within normal limits  LIPASE, BLOOD  I-STAT BETA HCG BLOOD, ED ([facility], [facility], [facility] ONLY)  POC OCCULT BLOOD, ED    EKG  EKG Interpretation None       Radiology Ct Abdomen Pelvis W Contrast  Result Date: 06/07/2017 CLINICAL DATA:  Abdominal and back pain for 1 day. Nausea, vomiting and constipation. History of breast cancer, myomectomies. EXAM: CT ABDOMEN AND PELVIS WITH CONTRAST TECHNIQUE: Multidetector CT imaging of the abdomen and pelvis was performed using the standard protocol following bolus administration of intravenous contrast. CONTRAST:  100 cc Isovue-300 COMPARISON:  CT abdomen and pelvis May 02, 2015 FINDINGS: LOWER CHEST: Dependent atelectasis. Included heart size is normal. No pericardial effusion. HEPATOBILIARY: Stable scattered subcentimeter probable cysts in the liver. Trace periportal edema. Normal  gallbladder. PANCREAS: Normal. SPLEEN: Stable subcentimeter cyst or lymphangioma in the spleen. ADRENALS/URINARY TRACT: Kidneys are orthotopic, demonstrating symmetric enhancement. No nephrolithiasis, hydronephrosis or solid renal masses. The unopacified ureters are normal in course and caliber. Delayed imaging through the kidneys demonstrates symmetric prompt contrast excretion within the proximal urinary collecting system. Urinary bladder is partially distended and unremarkable. Normal adrenal glands. STOMACH/BOWEL: Small hiatal hernia. The stomach, small and large bowel are normal in  course and caliber without inflammatory changes. The appendix is enlarged, LEFT end mm with periappendiceal inflammation and wall thickening. 5 mm appendicolith at the origin. VASCULAR/LYMPHATIC: Aortoiliac vessels are normal in course and caliber. No lymphadenopathy by CT size criteria. REPRODUCTIVE: Enlarged uterus. Multiple uterine leiomyoma, varying from cystic to calcified, including 5 cm RIGHT subserosal leiomyoma, previously 7 cm. OTHER: Small amount of free fluid LEFT pelvis. No focal fluid collection or intraperitoneal free air. MUSCULOSKELETAL: Nonacute.  Small fat containing umbilical hernia. IMPRESSION: 1. Acute appendicitis without complication. 2. Enlarged leiomyomatous uterus. 3. Acute findings discussed with and reconfirmed by PA.Nyelle Wolfson on 06/06/2017 at 12:15 am. Electronically Signed   By: Elon Alas M.D.   On: 06/07/2017 00:17    Procedures Procedures (including critical care time)  Medications Ordered in ED Medications  cefTRIAXone (ROCEPHIN) 2 g in dextrose 5 % 50 mL IVPB (not administered)    And  metroNIDAZOLE (FLAGYL) IVPB 500 mg (not administered)  ondansetron (ZOFRAN-ODT) disintegrating tablet 4 mg (4 mg Oral Given 06/06/17 1842)  morphine 4 MG/ML injection 4 mg (4 mg Intravenous Given 06/06/17 2316)  sodium chloride 0.9 % bolus 1,000 mL (1,000 mLs Intravenous New Bag/Given  06/06/17 2315)  ondansetron (ZOFRAN) injection 4 mg (4 mg Intravenous Given 06/06/17 2323)  iopamidol (ISOVUE-300) 61 % injection 100 mL (100 mLs Intravenous Contrast Given 06/06/17 2331)     Initial Impression / Assessment and Plan / ED Course  I have reviewed the triage vital signs and the nursing notes.  Pertinent labs & imaging results that were available during my care of the patient were reviewed by me and considered in my medical decision making (see chart for details).  Clinical Course as of Jun 07 46  Wed Jun 07, 2017  0033 Pt with acute appendicitis.  Will consult general surgery.  Last oral intake was noon yesterday.  [HM]  0037 Discussed with Dr. Donne Hazel who will admit.  Abx ordered.    [HM]    Clinical Course User Index [HM] Teegan Brandis, Jarrett Soho, PA-C    Pt with abd pain and vomiting. Concern for SBO vs colitis, appendicitis.  Leukocytosis noted.  Pt is afebrile and fecal occult is neg.  No tachycardia.    CT scan shows acute appendicitis.  Discussed with general surgery who will admit.  Final Clinical Impressions(s) / ED Diagnoses   Final diagnoses:  Lower abdominal pain  Other acute appendicitis    New Prescriptions New Prescriptions   No medications on file     Agapito Games 06/07/17 0964    Gareth Morgan, MD 06/07/17 1330

## 2017-06-07 ENCOUNTER — Observation Stay (HOSPITAL_COMMUNITY): Payer: Commercial Managed Care - PPO | Admitting: Anesthesiology

## 2017-06-07 ENCOUNTER — Encounter (HOSPITAL_COMMUNITY)
Admission: EM | Disposition: A | Discharge status: Home / Self Care | Payer: Self-pay | Source: Home / Self Care | Attending: Emergency Medicine

## 2017-06-07 ENCOUNTER — Encounter (HOSPITAL_COMMUNITY): Payer: Self-pay | Admitting: General Surgery

## 2017-06-07 DIAGNOSIS — K37 Unspecified appendicitis: Secondary | ICD-10-CM | POA: Diagnosis present

## 2017-06-07 HISTORY — PX: LAPAROSCOPIC APPENDECTOMY: SHX408

## 2017-06-07 LAB — SURGICAL PCR SCREEN
MRSA, PCR: INVALID — AB
STAPHYLOCOCCUS AUREUS: INVALID — AB

## 2017-06-07 SURGERY — APPENDECTOMY, LAPAROSCOPIC
Anesthesia: General

## 2017-06-07 MED ORDER — SUCCINYLCHOLINE CHLORIDE 200 MG/10ML IV SOSY
PREFILLED_SYRINGE | INTRAVENOUS | Status: AC
  Filled 2017-06-07: qty 10

## 2017-06-07 MED ORDER — DEXTROSE 5 % IV SOLN
2.0000 g | Freq: Once | INTRAVENOUS | Status: AC
  Administered 2017-06-07: 2 g via INTRAVENOUS
  Filled 2017-06-07: qty 2

## 2017-06-07 MED ORDER — MIDAZOLAM HCL 5 MG/5ML IJ SOLN
INTRAMUSCULAR | Status: DC | PRN
  Administered 2017-06-07: 2 mg via INTRAVENOUS

## 2017-06-07 MED ORDER — PHENYLEPHRINE 40 MCG/ML (10ML) SYRINGE FOR IV PUSH (FOR BLOOD PRESSURE SUPPORT)
PREFILLED_SYRINGE | INTRAVENOUS | Status: DC | PRN
  Administered 2017-06-07 (×3): 80 ug via INTRAVENOUS

## 2017-06-07 MED ORDER — LACTATED RINGERS IV SOLN: INTRAVENOUS | Status: DC

## 2017-06-07 MED ORDER — ENOXAPARIN SODIUM 40 MG/0.4ML ~~LOC~~ SOLN: 40.0000 mg | SUBCUTANEOUS | Status: DC

## 2017-06-07 MED ORDER — LIDOCAINE 2% (20 MG/ML) 5 ML SYRINGE
INTRAMUSCULAR | Status: AC
  Filled 2017-06-07: qty 5

## 2017-06-07 MED ORDER — LACTATED RINGERS IV SOLN
INTRAVENOUS | Status: DC | PRN
  Administered 2017-06-07 (×2): via INTRAVENOUS

## 2017-06-07 MED ORDER — ONDANSETRON HCL 4 MG/2ML IJ SOLN: 4.0000 mg | Freq: Four times a day (QID) | INTRAMUSCULAR | Status: DC | PRN

## 2017-06-07 MED ORDER — DEXAMETHASONE SODIUM PHOSPHATE 10 MG/ML IJ SOLN
INTRAMUSCULAR | Status: DC | PRN
  Administered 2017-06-07: 10 mg via INTRAVENOUS

## 2017-06-07 MED ORDER — ACETAMINOPHEN 325 MG PO TABS: 650.0000 mg | ORAL_TABLET | Freq: Four times a day (QID) | ORAL | Status: DC | PRN

## 2017-06-07 MED ORDER — ONDANSETRON HCL 4 MG/2ML IJ SOLN
INTRAMUSCULAR | Status: DC | PRN
  Administered 2017-06-07: 4 mg via INTRAVENOUS

## 2017-06-07 MED ORDER — EPHEDRINE SULFATE-NACL 50-0.9 MG/10ML-% IV SOSY
PREFILLED_SYRINGE | INTRAVENOUS | Status: DC | PRN
  Administered 2017-06-07: 10 mg via INTRAVENOUS

## 2017-06-07 MED ORDER — MIDAZOLAM HCL 2 MG/2ML IJ SOLN
INTRAMUSCULAR | Status: AC
  Filled 2017-06-07: qty 2

## 2017-06-07 MED ORDER — PROPOFOL 10 MG/ML IV BOLUS
INTRAVENOUS | Status: DC | PRN
Start: 2017-06-07 — End: 2017-06-07
  Administered 2017-06-07: 40 mg via INTRAVENOUS
  Administered 2017-06-07: 120 mg via INTRAVENOUS

## 2017-06-07 MED ORDER — DEXTROSE 5 % IV SOLN
2.0000 g | INTRAVENOUS | Status: DC
  Administered 2017-06-07: 2 g via INTRAVENOUS
  Filled 2017-06-07 (×2): qty 2

## 2017-06-07 MED ORDER — EPHEDRINE 5 MG/ML INJ
INTRAVENOUS | Status: AC
  Filled 2017-06-07: qty 10

## 2017-06-07 MED ORDER — PROPOFOL 10 MG/ML IV BOLUS
INTRAVENOUS | Status: AC
  Filled 2017-06-07: qty 20

## 2017-06-07 MED ORDER — METRONIDAZOLE IN NACL 5-0.79 MG/ML-% IV SOLN
500.0000 mg | Freq: Three times a day (TID) | INTRAVENOUS | Status: DC
  Administered 2017-06-07 – 2017-06-08 (×4): 500 mg via INTRAVENOUS
  Filled 2017-06-07 (×7): qty 100

## 2017-06-07 MED ORDER — MORPHINE SULFATE (PF) 2 MG/ML IV SOLN
2.0000 mg | INTRAVENOUS | Status: DC | PRN
  Administered 2017-06-07 (×3): 2 mg via INTRAVENOUS
  Filled 2017-06-07 (×3): qty 1

## 2017-06-07 MED ORDER — SODIUM CHLORIDE 0.9 % IV SOLN
INTRAVENOUS | Status: DC
  Administered 2017-06-07: 15:00:00 via INTRAVENOUS
  Administered 2017-06-07 (×2): 100 mL/h via INTRAVENOUS
  Administered 2017-06-08: 12:00:00 via INTRAVENOUS
  Administered 2017-06-08: 100 mL/h via INTRAVENOUS

## 2017-06-07 MED ORDER — OXYCODONE HCL 5 MG PO TABS
5.0000 mg | ORAL_TABLET | ORAL | Status: DC | PRN
  Administered 2017-06-07 – 2017-06-08 (×3): 5 mg via ORAL
  Filled 2017-06-07 (×3): qty 1

## 2017-06-07 MED ORDER — PANTOPRAZOLE SODIUM 40 MG PO TBEC
40.0000 mg | DELAYED_RELEASE_TABLET | Freq: Every day | ORAL | Status: DC
  Administered 2017-06-08: 40 mg via ORAL
  Filled 2017-06-07: qty 1

## 2017-06-07 MED ORDER — LACTATED RINGERS IR SOLN
Status: DC | PRN
  Administered 2017-06-07: 1000 mL

## 2017-06-07 MED ORDER — PROMETHAZINE HCL 25 MG/ML IJ SOLN
12.5000 mg | Freq: Four times a day (QID) | INTRAMUSCULAR | Status: DC | PRN
  Administered 2017-06-07: 12.5 mg via INTRAVENOUS
  Filled 2017-06-07: qty 1

## 2017-06-07 MED ORDER — ONDANSETRON 4 MG PO TBDP: 4.0000 mg | ORAL_TABLET | Freq: Four times a day (QID) | ORAL | Status: DC | PRN

## 2017-06-07 MED ORDER — FENTANYL CITRATE (PF) 100 MCG/2ML IJ SOLN
INTRAMUSCULAR | Status: DC | PRN
  Administered 2017-06-07 (×3): 50 ug via INTRAVENOUS

## 2017-06-07 MED ORDER — METRONIDAZOLE IN NACL 5-0.79 MG/ML-% IV SOLN
500.0000 mg | Freq: Once | INTRAVENOUS | Status: AC
  Administered 2017-06-07: 500 mg via INTRAVENOUS
  Filled 2017-06-07: qty 100

## 2017-06-07 MED ORDER — MEPERIDINE HCL 50 MG/ML IJ SOLN: 6.2500 mg | INTRAMUSCULAR | Status: DC | PRN

## 2017-06-07 MED ORDER — LIDOCAINE 2% (20 MG/ML) 5 ML SYRINGE
INTRAMUSCULAR | Status: DC | PRN
  Administered 2017-06-07: 60 mg via INTRAVENOUS

## 2017-06-07 MED ORDER — ROCURONIUM BROMIDE 50 MG/5ML IV SOSY
PREFILLED_SYRINGE | INTRAVENOUS | Status: AC
  Filled 2017-06-07: qty 5

## 2017-06-07 MED ORDER — BUPIVACAINE-EPINEPHRINE 0.25% -1:200000 IJ SOLN
INTRAMUSCULAR | Status: DC | PRN
  Administered 2017-06-07: 14 mL

## 2017-06-07 MED ORDER — ENOXAPARIN SODIUM 40 MG/0.4ML ~~LOC~~ SOLN
40.0000 mg | SUBCUTANEOUS | Status: DC
  Administered 2017-06-07: 40 mg via SUBCUTANEOUS
  Filled 2017-06-07 (×2): qty 0.4

## 2017-06-07 MED ORDER — BUPIVACAINE-EPINEPHRINE (PF) 0.25% -1:200000 IJ SOLN
INTRAMUSCULAR | Status: AC
  Filled 2017-06-07: qty 30

## 2017-06-07 MED ORDER — FENTANYL CITRATE (PF) 250 MCG/5ML IJ SOLN
INTRAMUSCULAR | Status: AC
  Filled 2017-06-07: qty 5

## 2017-06-07 MED ORDER — METOCLOPRAMIDE HCL 5 MG/ML IJ SOLN: 10.0000 mg | Freq: Once | INTRAMUSCULAR | Status: DC | PRN

## 2017-06-07 MED ORDER — FENTANYL CITRATE (PF) 100 MCG/2ML IJ SOLN: 25.0000 ug | INTRAMUSCULAR | Status: DC | PRN

## 2017-06-07 MED ORDER — SUGAMMADEX SODIUM 200 MG/2ML IV SOLN
INTRAVENOUS | Status: DC | PRN
  Administered 2017-06-07: 150 mg via INTRAVENOUS

## 2017-06-07 MED ORDER — ROCURONIUM BROMIDE 10 MG/ML (PF) SYRINGE
PREFILLED_SYRINGE | INTRAVENOUS | Status: DC | PRN
  Administered 2017-06-07: 40 mg via INTRAVENOUS
  Administered 2017-06-07: 10 mg via INTRAVENOUS

## 2017-06-07 MED ORDER — PHENYLEPHRINE 40 MCG/ML (10ML) SYRINGE FOR IV PUSH (FOR BLOOD PRESSURE SUPPORT)
PREFILLED_SYRINGE | INTRAVENOUS | Status: AC
  Filled 2017-06-07: qty 10

## 2017-06-07 SURGICAL SUPPLY — 41 items
ADH SKN CLS APL DERMABOND .7 (GAUZE/BANDAGES/DRESSINGS) ×1
APPLIER CLIP 5 13 M/L LIGAMAX5 (MISCELLANEOUS)
APPLIER CLIP ROT 10 11.4 M/L (STAPLE)
APR CLP MED LRG 11.4X10 (STAPLE)
APR CLP MED LRG 5 ANG JAW (MISCELLANEOUS)
BAG SPEC RTRVL LRG 6X4 10 (ENDOMECHANICALS) ×1
CABLE HIGH FREQUENCY MONO STRZ (ELECTRODE) IMPLANT
CHLORAPREP W/TINT 26ML (MISCELLANEOUS) ×2 IMPLANT
CLIP APPLIE 5 13 M/L LIGAMAX5 (MISCELLANEOUS) IMPLANT
CLIP APPLIE ROT 10 11.4 M/L (STAPLE) IMPLANT
COVER SURGICAL LIGHT HANDLE (MISCELLANEOUS) ×2 IMPLANT
CUTTER FLEX LINEAR 45M (STAPLE) ×1 IMPLANT
DECANTER SPIKE VIAL GLASS SM (MISCELLANEOUS) ×1 IMPLANT
DERMABOND ADVANCED (GAUZE/BANDAGES/DRESSINGS) ×1
DERMABOND ADVANCED .7 DNX12 (GAUZE/BANDAGES/DRESSINGS) IMPLANT
DRAPE LAPAROSCOPIC ABDOMINAL (DRAPES) ×2 IMPLANT
ELECT REM PT RETURN 15FT ADLT (MISCELLANEOUS) ×2 IMPLANT
GLOVE BIOGEL PI IND STRL 7.5 (GLOVE) ×1 IMPLANT
GLOVE BIOGEL PI INDICATOR 7.5 (GLOVE) ×1
GLOVE ECLIPSE 7.5 STRL STRAW (GLOVE) ×2 IMPLANT
GOWN STRL REUS W/TWL XL LVL3 (GOWN DISPOSABLE) ×4 IMPLANT
KIT BASIN OR (CUSTOM PROCEDURE TRAY) ×2 IMPLANT
PAD POSITIONING PINK XL (MISCELLANEOUS) ×2 IMPLANT
POUCH SPECIMEN RETRIEVAL 10MM (ENDOMECHANICALS) ×2 IMPLANT
RELOAD 45 VASCULAR/THIN (ENDOMECHANICALS) IMPLANT
RELOAD STAPLE 45 2.5 WHT GRN (ENDOMECHANICALS) IMPLANT
RELOAD STAPLE 45 3.5 BLU ETS (ENDOMECHANICALS) IMPLANT
RELOAD STAPLE TA45 3.5 REG BLU (ENDOMECHANICALS) ×2 IMPLANT
SCISSORS LAP 5X35 DISP (ENDOMECHANICALS) ×2 IMPLANT
SET IRRIG TUBING LAPAROSCOPIC (IRRIGATION / IRRIGATOR) ×2 IMPLANT
SHEARS HARMONIC ACE PLUS 36CM (ENDOMECHANICALS) ×2 IMPLANT
SLEEVE XCEL OPT CAN 5 100 (ENDOMECHANICALS) ×2 IMPLANT
SUT MNCRL AB 4-0 PS2 18 (SUTURE) ×2 IMPLANT
SUT VIC AB 3-0 SH 27 (SUTURE) ×2
SUT VIC AB 3-0 SH 27XBRD (SUTURE) IMPLANT
TOWEL OR 17X26 10 PK STRL BLUE (TOWEL DISPOSABLE) ×2 IMPLANT
TRAY FOLEY W/METER SILVER 16FR (SET/KITS/TRAYS/PACK) ×2 IMPLANT
TRAY LAPAROSCOPIC (CUSTOM PROCEDURE TRAY) ×2 IMPLANT
TROCAR BLADELESS OPT 5 100 (ENDOMECHANICALS) ×2 IMPLANT
TROCAR XCEL BLUNT TIP 100MML (ENDOMECHANICALS) ×2 IMPLANT
TUBING INSUF HEATED (TUBING) ×2 IMPLANT

## 2017-06-07 NOTE — Anesthesia Procedure Notes (Signed)

## 2017-06-07 NOTE — Transfer of Care (Signed)
Immediate Anesthesia Transfer of Care Note  Patient: Meagan [name]  Procedure(s) Performed: APPENDECTOMY LAPAROSCOPIC (N/A )  Patient Location: PACU  Anesthesia Type:General  Level of Consciousness: sedated, patient cooperative and responds to stimulation  Airway & Oxygen Therapy: Patient Spontanous Breathing and Patient connected to face mask oxygen  Post-op Assessment: Report given to RN and Post -op Vital signs reviewed and stable  Post vital signs: Reviewed and stable  Last Vitals:  Vitals:   06/07/17 0541 06/07/17 0908  BP: (!) 116/43 (!) 113/47  Pulse: 70 65  Resp: 18 16  Temp: 36.8 C 36.7 C  SpO2: 99% 97%    Last Pain:  Vitals:   06/07/17 0908  TempSrc: Oral  PainSc:       Patients Stated Pain Goal: 2 (29/92/42 6834)  Complications: No apparent anesthesia complications

## 2017-06-07 NOTE — Progress Notes (Signed)
Patient remains very nauseated; Dr Excell Seltzer paged to report same, as zofran is not due yet. Donne Hazel, RN

## 2017-06-07 NOTE — Anesthesia Preprocedure Evaluation (Signed)
Anesthesia Evaluation  Patient identified by MRN, date of birth, ID band Patient awake    Reviewed: Allergy & Precautions, NPO status , Patient's Chart, lab work & pertinent test results  Airway Mallampati: II  TM Distance: >3 FB Neck ROM: Full    Dental no notable dental hx.    Pulmonary neg pulmonary ROS,    Pulmonary exam normal breath sounds clear to auscultation       Cardiovascular negative cardio ROS Normal cardiovascular exam Rhythm:Regular Rate:Normal     Neuro/Psych negative neurological ROS  negative psych ROS   GI/Hepatic negative GI ROS, Neg liver ROS,   Endo/Other  negative endocrine ROS  Renal/GU negative Renal ROS  negative genitourinary   Musculoskeletal negative musculoskeletal ROS (+)   Abdominal   Peds negative pediatric ROS (+)  Hematology negative hematology ROS (+)   Anesthesia Other Findings   Reproductive/Obstetrics negative OB ROS                             Anesthesia Physical Anesthesia Plan  ASA: II  Anesthesia Plan: General   Post-op Pain Management:    Induction: Intravenous  PONV Risk Score and Plan: 3 and Ondansetron, Dexamethasone, Midazolam and Treatment may vary due to age or medical condition  Airway Management Planned: Oral ETT  Additional Equipment:   Intra-op Plan:   Post-operative Plan: Extubation in OR  Informed Consent: I have reviewed the patients History and Physical, chart, labs and discussed the procedure including the risks, benefits and alternatives for the proposed anesthesia with the patient or authorized representative who has indicated his/her understanding and acceptance.     Dental advisory given  Plan Discussed with: CRNA  Anesthesia Plan Comments:         Anesthesia Quick Evaluation  

## 2017-06-07 NOTE — Anesthesia Postprocedure Evaluation (Signed)
Anesthesia Post Note  Patient: Meagan [name]  Procedure(s) Performed: APPENDECTOMY LAPAROSCOPIC (N/A )     Patient location during evaluation: PACU Anesthesia Type: General Level of consciousness: awake and alert Pain management: pain level controlled Vital Signs Assessment: post-procedure vital signs reviewed and stable Respiratory status: spontaneous breathing, nonlabored ventilation, respiratory function stable and patient connected to nasal cannula oxygen Cardiovascular status: blood pressure returned to baseline and stable Postop Assessment: no apparent nausea or vomiting Anesthetic complications: no    Last Vitals:  Vitals:   06/07/17 1400 06/07/17 1415  BP: (!) 102/57 107/66  Pulse: 73 70  Resp: 15 15  Temp:    SpO2: 100% 97%    Last Pain:  Vitals:   06/07/17 1400  TempSrc:   PainSc: Asleep                 Montez Hageman

## 2017-06-07 NOTE — Discharge Instructions (Signed)
Laparoscopic Appendectomy, Adult, Care After °Refer to this sheet in the next few weeks. These instructions provide you with information about caring for yourself after your procedure. Your health care provider may also give you more specific instructions. Your treatment has been planned according to current medical practices, but problems sometimes occur. Call your health care provider if you have any problems or questions after your procedure. °What can I expect after the procedure? °After the procedure, it is common to have: °· A decrease in your energy level. °· Mild pain in the area where the surgical cuts (incisions) were made. °· Constipation. This can be caused by pain medicine and a decrease in your activity. ° °Follow these instructions at home: °Medicines °· Take over-the-counter and prescription medicines only as told by your health care provider. °· Do not drive for 24 hours if you received a sedative. °· Do not drive or operate heavy machinery while taking prescription pain medicine. °· If you were prescribed an antibiotic medicine, take it as told by your health care provider. Do not stop taking the antibiotic even if you start to feel better. °Activity °· For 3 weeks or as long as told by your health care provider: °? Do not lift anything that is heavier than 10 pounds (4.5 kg). °? Do not play contact sports. °· Gradually return to your normal activities. Ask your health care provider what activities are safe for you. °Bathing °· Keep your incisions clean and dry. Clean them as often as told by your health care provider: °? Gently wash the incisions with soap and water. °? Rinse the incisions with water to remove all soap. °? Pat the incisions dry with a clean towel. Do not rub the incisions. °· You may take showers after 48 hours. °· Do not take baths, swim, or use hot tubs for 2 weeks or as told by your health care provider. °Incision care °· Follow instructions from your healthcare provider about  how to take care of your incisions. Make sure you: °? Wash your hands with soap and water before you change your bandage (dressing). If soap and water are not available, use hand sanitizer. °? Change your dressing as told by your health care provider. °? Leave stitches (sutures), skin glue, or adhesive strips in place. These skin closures may need to stay in place for 2 weeks or longer. If adhesive strip edges start to loosen and curl up, you may trim the loose edges. Do not remove adhesive strips completely unless your health care provider tells you to do that. °· Check your incision areas every day for signs of infection. Check for: °? More redness, swelling, or pain. °? More fluid or blood. °? Warmth. °? Pus or a bad smell. °Other Instructions °· If you were sent home with a drain, follow instructions from your health care provider about how to care for the drain and how to empty it. °· Take deep breaths. This helps to prevent your lungs from becoming inflamed. °· To relieve and prevent constipation: °? Drink plenty of fluids. °? Eat plenty of fruits and vegetables. °· Keep all follow-up visits as told by your health care provider. This is important. °Contact a health care provider if: °· You have more redness, swelling, or pain around an incision. °· You have more fluid or blood coming from an incision. °· Your incision feels warm to the touch. °· You have pus or a bad smell coming from an incision or dressing. °· Your incision   edges break open after your sutures have been removed.  You have increasing pain in your shoulders.  You feel dizzy or you faint.  You develop shortness of breath.  You keep feeling nauseous or vomiting.  You have diarrhea or you cannot control your bowel functions.  You lose your appetite.  You develop swelling or pain in your legs. Get help right away if:  You have a fever.  You develop a rash.  You have difficulty breathing.  You have sharp pains in your  chest. This information is not intended to replace advice given to you by your health care provider. Make sure you discuss any questions you have with your health care provider. Document Released: 08/01/2005 Document Revised: 01/01/2016 Document Reviewed: 01/19/2015 Elsevier Interactive Patient Education  2017 [place] [employer] LAPAROSCOPIC SURGERY: POST OP INSTRUCTIONS Always review your discharge instruction sheet given to you by the facility where your surgery was performed. IF YOU HAVE DISABILITY OR FAMILY LEAVE FORMS, YOU MUST BRING THEM TO THE OFFICE FOR PROCESSING.   DO NOT GIVE THEM TO YOUR DOCTOR.  1. A prescription for pain medication may be given to you upon discharge.  Take your pain medication as prescribed, if needed.  If narcotic pain medicine is not needed, then you may take acetaminophen (Tylenol) or ibuprofen (Advil) as needed. 2. Take your usually prescribed medications unless otherwise directed. 3. If you need a refill on your pain medication, please contact your pharmacy.  They will contact our office to request authorization. Prescriptions will not be filled after 5pm or on week-ends. 4. You should follow a light diet the first few days after arrival home, such as soup and crackers, etc.  Be sure to include lots of fluids daily. 5. Most patients will experience some swelling and bruising in the area of the incisions.  Ice packs will help.  Swelling and bruising can take several days to resolve.  6. It is common to experience some constipation if taking pain medication after surgery.  Increasing fluid intake and taking a stool softener (such as Colace) will usually help or prevent this problem from occurring.  A mild laxative (Milk of Magnesia or Miralax) should be taken according to package instructions if there are no bowel movements after 48 hours. 7. Unless discharge instructions indicate otherwise, you may remove your bandages 24-48  hours after surgery, and you may shower at that time.  You may have steri-strips (small skin tapes) in place directly over the incision.  These strips should be left on the skin for 7-10 days.  If your surgeon used skin glue on the incision, you may shower in 24 hours.  The glue will flake off over the next 2-3 weeks.  Any sutures or staples will be removed at the office during your follow-up visit. 8. ACTIVITIES:  You may resume regular (light) daily activities beginning the next day--such as daily self-care, walking, climbing stairs--gradually increasing activities as tolerated.  You may have sexual intercourse when it is comfortable.  Refrain from any heavy lifting or straining until approved by your doctor. a. You may drive when you are no longer taking prescription pain medication, you can comfortably wear a seatbelt, and you can safely maneuver your car and apply brakes. b. RETURN TO WORK:  __________________________________________________________ 9. You should see your doctor in the office for a follow-up appointment approximately 2-3 weeks after your surgery.  Make sure that you call for this appointment within a day or  two after you arrive home to insure a convenient appointment time. °10. OTHER INSTRUCTIONS: __________________________________________________________________________________________________________________________ __________________________________________________________________________________________________________________________ °WHEN TO CALL YOUR DOCTOR: °1. Fever over 101.0 °2. Inability to urinate °3. Continued bleeding from incision. °4. Increased pain, redness, or drainage from the incision. °5. Increasing abdominal pain ° °The clinic staff is available to answer your questions during regular business hours.  Please don’t hesitate to call and ask to speak to one of the nurses for clinical concerns.  If you have a medical emergency, go to the nearest emergency room or call  911.  A surgeon from [facility] is always on call at the hospital. °[address], [address], [place], [place]  [address] ? [address] [address], [place], [place]   [address] °[phone] ? [phone] ? FAX [phone] °Web site: [email or web address] ° °

## 2017-06-07 NOTE — H&P (Signed)
Meagan [name] is an 56 y.o. female.   Chief Complaint: abd pain HPI: 23 yof with less than 24 hours of lower abdominal pain. This has been getting worse. Nothing at home helping. She has had a bm. No emesis. No fever. Has some right lower back pain also. No issues with urination. She was seen in er and underwent ct that shows appendicitis with fecalith. She has multiple surgeries mostly for fibroids.  She has history breast cancer that only underwent lumpectomy- declined routine therapy and is undergoing homeopathic treatment.   She has keloid below umbilicus she has had injected in [place] and has a small uh on her ct scan.   Past Medical History:  Diagnosis Date  . Arthritis of hand   . 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 Hilarie Fredrickson)  . Breast cancer, left ([place]) 06/26/15   1 cm mass, + core bx; invasive ductal carcinoma: suggested tx plan was breast conservation surgery followed by HER-2 based therapy with accompanying chemotherapy, as well as radiation, followed by anti-estrogens.  However, as of 08/2015 f/u with Dr. Jana Hakim she has declined ALL chemo/med/radiation tx's and wants her port removed.  . Endometrial polyp   . Endometriosis   . Fibroids   . GERD (gastroesophageal reflux disease)    +Barretts esophagus on endo 09/2012  . H/O hiatal hernia   . Helicobacter pylori (H. pylori)    positive serology 07/2012  . History of iron deficiency anemia    pt does not tolerate oral iron; plan for feraheme per oncologist as of 06/2016  . Hyperlipidemia   . Menorrhagia    has led to iron def anemia  . Rosacea   . SAB (spontaneous abortion)   . Shortness of breath    on exertion  . Sleep apnea    no CPAP    Past Surgical History:  Procedure Laterality Date  . BREAST BIOPSY  06/29/15   Core needle: + invasive mammary carcinoma  . BREAST LUMPECTOMY WITH RADIOACTIVE SEED AND SENTINEL LYMPH NODE BIOPSY Left 07/28/2015   Path: invasive ductal carcinoma, neg margins,  sentinal LN x 3 neg for tumor.  Procedure: LEFT BREAST LUMPECTOMY WITH RADIOACTIVE SEED AND SENTINEL LYMPH NODE BIOPSY;  Surgeon: Erroll Luna, MD;  Location: [facility];  Service: General;  Laterality: Left;  . D & C HYSTEROSCOPY/ POLYPECTOMY/ DX LAPAROSCOPY/ LYSIS ADHESIONS/ ABLATION ENDOMETRIOSIS  04-10-2009  . DILATATION & CURETTAGE/HYSTEROSCOPY WITH TRUECLEAR N/A 03/29/2013   Procedure: DILATATION & CURETTAGE/HYSTEROSCOPY WITH TRUECLEAR;  Surgeon: Marylynn Pearson, MD;  Location: [facility];  Service: Gynecology;  Laterality: N/A;  . DILATION AND CURETTAGE OF UTERUS    . ESOPHAGOGASTRODUODENOSCOPY  10/01/12; 06/2016   chronic inactive gastritis (H. pylori neg, no dysplasia or malig),+ Barretts esophagus (Dr. Hilarie Fredrickson).  2017 repeat showed Barrett's esoph, o/w normal.  Repeat 3 yrs recommended.  . EXP. LAP. LEFT OVARIAN CYSTECTOMY / LYSIS ADHESIONS/ MYOMECTOMY  2008  . HYSTEROSCOPY  2010  . HYSTEROSCOPY W/D&C  08/25/2011   Procedure: DILATATION AND CURETTAGE /HYSTEROSCOPY;  Surgeon: Marylynn Pearson;  Location: [facility];  Service: Gynecology;;  . MYOMECTOMY    . MYOMECTOMY ABDOMINAL APPROACH    . POLYPECTOMY  2013   ENDOMETRIAL POLYP  . PORTACATH PLACEMENT Right 07/28/2015   Procedure: INSERTION PORT-A-CATH WITH ULTRA SOUND;  Surgeon: Erroll Luna, MD;  Location: [facility];  Service: General;  Laterality: Right;  . WISDOM TOOTH EXTRACTION      Family History  Problem Relation Age of Onset  . Prostate cancer Father   . Hypertension Father   . Anuerysm Father   . Heart disease Father   . Heart disease Mother   . Fibroids Sister        uterine  . Uterine cancer Maternal Grandmother   . Heart disease Maternal Aunt   . Heart disease Maternal Uncle   . Heart disease Paternal Uncle   . Stomach cancer Other        cousin  . Diabetes Other        half sister and brother  . Esophageal cancer Neg Hx   . Colon cancer Neg Hx    Social History:   reports that she has never smoked. She has never used smokeless tobacco. She reports that she does not drink alcohol or use drugs.  Allergies: No Known Allergies  meds reviewed  Results for orders placed or performed during the hospital encounter of 06/06/17 (from the past 48 hour(s))  Lipase, blood     Status: None   Collection Time: 06/06/17  6:54 PM  Result Value Ref Range   Lipase 28 11 - 51 U/L  Comprehensive metabolic panel     Status: Abnormal   Collection Time: 06/06/17  6:54 PM  Result Value Ref Range   Sodium 135 135 - 145 mmol/L   Potassium 3.9 3.5 - 5.1 mmol/L   Chloride 103 101 - 111 mmol/L   CO2 21 (L) 22 - 32 mmol/L   Glucose, Bld 117 (H) 65 - 99 mg/dL   BUN 15 6 - 20 mg/dL   Creatinine, Ser 0.79 0.44 - 1.00 mg/dL   Calcium 9.3 8.9 - 10.3 mg/dL   Total Protein 7.5 6.5 - 8.1 g/dL   Albumin 4.3 3.5 - 5.0 g/dL   AST 19 15 - 41 U/L   ALT 14 14 - 54 U/L   Alkaline Phosphatase 58 38 - 126 U/L   Total Bilirubin 0.9 0.3 - 1.2 mg/dL   GFR calc non Af Amer >60 >60 mL/min   GFR calc Af Amer >60 >60 mL/min    Comment: (NOTE) The eGFR has been calculated using the CKD EPI equation. This calculation has not been validated in all clinical situations. eGFR's persistently <60 mL/min signify possible Chronic Kidney Disease.    Anion gap 11 5 - 15  CBC     Status: Abnormal   Collection Time: 06/06/17  6:54 PM  Result Value Ref Range   WBC 17.6 (H) 4.0 - 10.5 K/uL   RBC 3.90 3.87 - 5.11 MIL/uL   Hemoglobin 11.6 (L) 12.0 - 15.0 g/dL   HCT 34.0 (L) 36.0 - 46.0 %   MCV 87.2 78.0 - 100.0 fL   MCH 29.7 26.0 - 34.0 pg   MCHC 34.1 30.0 - 36.0 g/dL   RDW 12.6 11.5 - 15.5 %   Platelets 258 150 - 400 K/uL  I-Stat beta hCG blood, ED     Status: None   Collection Time: 06/06/17  7:06 PM  Result Value Ref Range   I-stat hCG, quantitative <5.0 <5 mIU/mL   Comment 3            Comment:   GEST. AGE      CONC.  (mIU/mL)   <=1 WEEK        5 - 50     2 WEEKS       50 - 500     3  WEEKS  100 - 10,000     4 WEEKS     1,000 - 30,000        FEMALE AND NON-PREGNANT FEMALE:     LESS THAN 5 mIU/mL   Urinalysis, Routine w reflex microscopic     Status: Abnormal   Collection Time: 06/06/17  9:55 PM  Result Value Ref Range   Color, Urine YELLOW YELLOW   APPearance CLEAR CLEAR   Specific Gravity, Urine 1.026 1.005 - 1.030   pH 5.0 5.0 - 8.0   Glucose, UA NEGATIVE NEGATIVE mg/dL   Hgb urine dipstick NEGATIVE NEGATIVE   Bilirubin Urine NEGATIVE NEGATIVE   Ketones, ur 80 (A) NEGATIVE mg/dL   Protein, ur 30 (A) NEGATIVE mg/dL   Nitrite NEGATIVE NEGATIVE   Leukocytes, UA NEGATIVE NEGATIVE   RBC / HPF 0-5 0 - 5 RBC/hpf   WBC, UA 0-5 0 - 5 WBC/hpf   Bacteria, UA NONE SEEN NONE SEEN   Squamous Epithelial / LPF 0-5 (A) NONE SEEN   Mucus PRESENT   POC occult blood, ED Provider will collect     Status: None   Collection Time: 06/06/17 11:07 PM  Result Value Ref Range   Fecal Occult Bld NEGATIVE NEGATIVE   Ct Abdomen Pelvis W Contrast  Result Date: 06/07/2017 CLINICAL DATA:  Abdominal and back pain for 1 day. Nausea, vomiting and constipation. History of breast cancer, myomectomies. EXAM: CT ABDOMEN AND PELVIS WITH CONTRAST TECHNIQUE: Multidetector CT imaging of the abdomen and pelvis was performed using the standard protocol following bolus administration of intravenous contrast. CONTRAST:  100 cc Isovue-300 COMPARISON:  CT abdomen and pelvis May 02, 2015 FINDINGS: LOWER CHEST: Dependent atelectasis. Included heart size is normal. No pericardial effusion. HEPATOBILIARY: Stable scattered subcentimeter probable cysts in the liver. Trace periportal edema. Normal gallbladder. PANCREAS: Normal. SPLEEN: Stable subcentimeter cyst or lymphangioma in the spleen. ADRENALS/URINARY TRACT: Kidneys are orthotopic, demonstrating symmetric enhancement. No nephrolithiasis, hydronephrosis or solid renal masses. The unopacified ureters are normal in course and caliber. Delayed imaging  through the kidneys demonstrates symmetric prompt contrast excretion within the proximal urinary collecting system. Urinary bladder is partially distended and unremarkable. Normal adrenal glands. STOMACH/BOWEL: Small hiatal hernia. The stomach, small and large bowel are normal in course and caliber without inflammatory changes. The appendix is enlarged, LEFT end mm with periappendiceal inflammation and wall thickening. 5 mm appendicolith at the origin. VASCULAR/LYMPHATIC: Aortoiliac vessels are normal in course and caliber. No lymphadenopathy by CT size criteria. REPRODUCTIVE: Enlarged uterus. Multiple uterine leiomyoma, varying from cystic to calcified, including 5 cm RIGHT subserosal leiomyoma, previously 7 cm. OTHER: Small amount of free fluid LEFT pelvis. No focal fluid collection or intraperitoneal free air. MUSCULOSKELETAL: Nonacute.  Small fat containing umbilical hernia. IMPRESSION: 1. Acute appendicitis without complication. 2. Enlarged leiomyomatous uterus. 3. Acute findings discussed with and reconfirmed by PA.HANNAH MUTHERSBAUGH on 06/06/2017 at 12:15 am. Electronically Signed   By: Elon Alas M.D.   On: 06/07/2017 00:17    Review of Systems  Gastrointestinal: Positive for abdominal pain and nausea.  Genitourinary: Positive for flank pain.  All other systems reviewed and are negative.   Blood pressure 114/75, pulse 69, temperature 97.7 F (36.5 C), temperature source Oral, resp. rate 14, height '5\' 3"'$  (1.6 m), weight 59 kg (130 lb), SpO2 100 %. Physical Exam  Vitals reviewed. Constitutional: She is oriented to person, place, and time. She appears well-developed and well-nourished.  HENT:  Head: Normocephalic and atraumatic.  Eyes: No scleral icterus.  Cardiovascular: Normal  rate, regular rhythm and normal heart sounds.   Respiratory: Effort normal and breath sounds normal. She has no wheezes. She has no rales.  GI: Soft. Bowel sounds are normal. She exhibits no distension. There  is tenderness in the right lower quadrant and suprapubic area. No hernia (cannot palpate uh on ct scan, she does has keloid infraumbilical).  Lymphadenopathy:    She has no cervical adenopathy.  Neurological: She is alert and oriented to person, place, and time.  Skin: Skin is warm and dry. She is not diaphoretic.  Psychiatric: She has a normal mood and affect. Her behavior is normal.     Assessment/Plan Appendicitis We discussed pathophysiology of appendicitis. We discussed pros/cons of nonoperative mgt with just abx.  I recommended lap appy to her and described this.  She is agreeable.  Will admit her now, give abx, iv fluids, npo and plan for lap appy in am with Dr Excell Seltzer.  She and her husband voice understanding.   Rolm Bookbinder, MD 06/07/2017, 1:15 AM

## 2017-06-07 NOTE — Op Note (Signed)
Preoperative Diagnosis: Acute appendicitis  Postoprative Diagnosis: Same  Procedure: Procedure(s): APPENDECTOMY LAPAROSCOPIC   Surgeon: Excell Seltzer T   Assistants: None  Anesthesia:  General endotracheal anesthesia  Indications: Patient is a 56 year old female that resents with 3 days of progressively worsening bilateral lower abdominal pain somewhat worse on the right associated with nausea and vomiting.  CT has confirmed acute appendicitis with appendicolith. After discussion regarding the surgery and risks detailed elsewhere we have elected to proceed with emergency laparoscopic appendectomy.    Procedure Detail:  Patient was brought to the operating room, placed in the supine position on the operating table, and general endotracheal anesthesia induced. She received preoperative broad-spectrum IV antibiotics. PAS were in place. The abdomen was widely sterilely prepped and draped. Patient timeout was performed and correct procedure verified. Foley catheter had been placed. The patient had a keloid at her umbilicus from previous GYN surgery for fibroids and as we had discussed preoperatively I elliptically excised this into the subcutaneous tissue back to healthy skin. Dissection was carried down to midline fascia which was incised for 1-1/2 cm. The fascia was quite thick possibly from previous surgery and had some difficulty entering free peritoneal cavity and concerned that I might be into some dense adhesions from her previous pelvic surgery. I therefore elected to access via a 5 mm Optiview trocar in the left upper quadrant. This was performed without difficulty and pneumoperitoneum established. There was some cloudy peritoneal fluid in both gutters. I had not gone through the peritoneum at the umbilicus and this incision was deepened and the [facility] trocar placed and secured and an additional 5 mm trocar placed in the left lower quadrant. The appendix was visualized in the right lower  quadrant and was acutely inflamed with exudate and possibly some early gangrenous changes. The appendix was elevated. There was thickening around the base of the appendix and tip of the cecum. I initially divided the mesoappendix sequentially with the Harmonic scalpel. There appeared to be early gangrenous changes at this level and I carefully divided fibrofatty tissue down toward the tip of the cecum and was able to dissect out another 1/2 cm of appendix as it widened out to the tip of the cecum which was just edematous without severe inflammation. The appendix was then divided across the tip of the cecum and the Sigel firing of the GIA 45 mm blue load stapler. The appendix was placed in Endo Catch bag and brought out through the umbilical incision. The abdomen was thoroughly irrigated with saline until clear. I carefully examined the viscera and the left upper quadrant including small bowel from ligament of Treitz down to the right side as well as the transverse colon and I did not see any evidence of injury from the Optiview trocar and I think the cloudy fluid seen initially was just from her appendicitis. This was irrigated and suctioned and there was no blood or further fluid. After complete inspection of the abdomen the [facility] trocar was removed and a mattress suture secured to the umbilicus. All CO2 was evacuated and other trochars removed. Skin incisions were closed with subcuticular 5-0 Monocryl and Dermabond. Sponge and needle and instrument counts were correct.    Findings: As above  Estimated Blood Loss:  Minimal         Drains: None  Blood Given: none          Specimens: Appendix        Complications:  * No complications entered in OR log *  Disposition: PACU - hemodynamically stable.         Condition: stable

## 2017-06-07 NOTE — Progress Notes (Signed)
Patient ID: Meagan [name], female   DOB: 1961-07-21, 56 y.o.   MRN: [id number]     Subjective: No change, some better with pain medication.  Objective: Vital signs in last 24 hours: Temp:  [97.7 F (36.5 C)-98.9 F (37.2 C)] 98.3 F (36.8 C) (10/24 0541) Pulse Rate:  [65-72] 70 (10/24 0541) Resp:  [14-20] 18 (10/24 0541) BP: (105-121)/(43-75) 116/43 (10/24 0541) SpO2:  [98 %-100 %] 99 % (10/24 0541) Weight:  [59 kg (130 lb)] 59 kg (130 lb) (10/23 1838) Last BM Date: 06/07/17  Intake/Output from previous day: 10/23 0701 - 10/24 0700 In: 1000 [IV Piggyback:1000] Out: -  Intake/Output this shift: No intake/output data recorded.  General appearance: alert, cooperative and no distress GI: Tender right lower quadrant with some guarding. 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 I cannot feel a definite hernia there.  Lab Results:   Recent Labs  06/06/17 1854  WBC 17.6*  HGB 11.6*  HCT 34.0*  PLT 258   BMET  Recent Labs  06/06/17 1854  NA 135  K 3.9  CL 103  CO2 21*  GLUCOSE 117*  BUN 15  CREATININE 0.79  CALCIUM 9.3     Studies/Results: Ct Abdomen Pelvis W Contrast  Result Date: 06/07/2017 CLINICAL DATA:  Abdominal and back pain for 1 day. Nausea, vomiting and constipation. History of breast cancer, myomectomies. EXAM: CT ABDOMEN AND PELVIS WITH CONTRAST TECHNIQUE: Multidetector CT imaging of the abdomen and pelvis was performed using the standard protocol following bolus administration of intravenous contrast. CONTRAST:  100 cc Isovue-300 COMPARISON:  CT abdomen and pelvis May 02, 2015 FINDINGS: LOWER CHEST: Dependent atelectasis. Included heart size is normal. No pericardial effusion. HEPATOBILIARY: Stable scattered subcentimeter probable cysts in the liver. Trace periportal edema. Normal gallbladder. PANCREAS: Normal. SPLEEN: Stable subcentimeter cyst or lymphangioma in the spleen. ADRENALS/URINARY TRACT: Kidneys are orthotopic, demonstrating symmetric enhancement. No  nephrolithiasis, hydronephrosis or solid renal masses. The unopacified ureters are normal in course and caliber. Delayed imaging through the kidneys demonstrates symmetric prompt contrast excretion within the proximal urinary collecting system. Urinary bladder is partially distended and unremarkable. Normal adrenal glands. STOMACH/BOWEL: Small hiatal hernia. The stomach, small and large bowel are normal in course and caliber without inflammatory changes. The appendix is enlarged, LEFT end mm with periappendiceal inflammation and wall thickening. 5 mm appendicolith at the origin. VASCULAR/LYMPHATIC: Aortoiliac vessels are normal in course and caliber. No lymphadenopathy by CT size criteria. REPRODUCTIVE: Enlarged uterus. Multiple uterine leiomyoma, varying from cystic to calcified, including 5 cm RIGHT subserosal leiomyoma, previously 7 cm. OTHER: Small amount of free fluid LEFT pelvis. No focal fluid collection or intraperitoneal free air. MUSCULOSKELETAL: Nonacute.  Small fat containing umbilical hernia. IMPRESSION: 1. Acute appendicitis without complication. 2. Enlarged leiomyomatous uterus. 3. Acute findings discussed with and reconfirmed by PA.HANNAH MUTHERSBAUGH on 06/06/2017 at 12:15 am. Electronically Signed   By: Elon Alas M.D.   On: 06/07/2017 00:17    Anti-infectives: Anti-infectives    Start     Dose/Rate Route Frequency Ordered Stop   06/07/17 2200  cefTRIAXone (ROCEPHIN) 2 g in dextrose 5 % 50 mL IVPB     2 g 100 mL/hr over 30 Minutes Intravenous Every 24 hours 06/07/17 0320     06/07/17 0600  metroNIDAZOLE (FLAGYL) IVPB 500 mg     500 mg 100 mL/hr over 60 Minutes Intravenous Every 8 hours 06/07/17 0320     06/07/17 0045  cefTRIAXone (ROCEPHIN) 2 g in dextrose 5 % 50 mL IVPB  2 g 100 mL/hr over 30 Minutes Intravenous  Once 06/07/17 0036 06/07/17 0339   06/07/17 0045  metroNIDAZOLE (FLAGYL) IVPB 500 mg     500 mg 100 mL/hr over 60 Minutes Intravenous  Once 06/07/17 0036  06/07/17 0252      Assessment/Plan: Acute appendicitis with fecalith. Patient is on antibiotics. I recommended proceeding with emergency laparoscopic appendectomy. I discussed the procedure and indications, expected recovery and risks of anesthetic complications, cardiorespiratory complications, bleeding, infection, visceral injury and possible conversion to open procedure. I discussed removing her keloid as it is at the site of a planned incision and we discussed the likelihood that it would recur. I discussed repairing the apparent small incisional hernia at the umbilicus. All her questions were answered and she is in agreement.    LOS: 0 days    Raylan Hanton T 06/07/2017

## 2017-06-08 LAB — URINALYSIS, ROUTINE W REFLEX MICROSCOPIC
BILIRUBIN URINE: NEGATIVE
Glucose, UA: NEGATIVE mg/dL
Ketones, ur: NEGATIVE mg/dL
Nitrite: NEGATIVE
Protein, ur: NEGATIVE mg/dL
SPECIFIC GRAVITY, URINE: 1.02 (ref 1.005–1.030)
pH: 5 (ref 5.0–8.0)

## 2017-06-08 LAB — MRSA CULTURE: Culture: NOT DETECTED

## 2017-06-08 MED ORDER — POLYETHYLENE GLYCOL 3350 17 GM/SCOOP PO POWD: ORAL | Status: DC

## 2017-06-08 MED ORDER — IBUPROFEN 200 MG PO TABS: 600.0000 mg | ORAL_TABLET | Freq: Four times a day (QID) | ORAL | Status: DC | PRN

## 2017-06-08 MED ORDER — OXYCODONE HCL 5 MG PO TABS: 5.0000 mg | ORAL_TABLET | ORAL | 0 refills | Status: DC | PRN

## 2017-06-08 MED ORDER — PANTOPRAZOLE SODIUM 40 MG PO TBEC: 40.0000 mg | DELAYED_RELEASE_TABLET | Freq: Every day | ORAL | Status: DC

## 2017-06-08 MED ORDER — IBUPROFEN 200 MG PO TABS: ORAL_TABLET | ORAL | Status: DC

## 2017-06-08 NOTE — Progress Notes (Signed)
Discharge instructions given. Pt verbalized understanding and all questions were answered.  

## 2017-06-08 NOTE — Progress Notes (Signed)
Physician Discharge Summary  Patient ID: Meagan [name] MRN: [id number] DOB/AGE: 18-Dec-1960 56 y.o.  Admit date: 06/06/2017 Discharge date: 06/08/2017  Admission Diagnoses:  Acute appendicitis History of breast cancer History of endometriosis/fibroids History of GERD/H. pylori and hiatal hernia. Constipation  Discharge Diagnoses:  Same  Active Problems:   Appendicitis   PROCEDURES: Laparoscopic appendectomy 06/07/17 Dr. Marland Kitchen [facility] Course:  56 yof with less than 24 hours of lower abdominal pain. This has been getting worse. Nothing at home helping. She has had a bm. No emesis. No fever. Has some right lower back pain also. No issues with urination. She was seen in er and underwent ct that shows appendicitis with fecalith. She has multiple surgeries mostly for fibroids.  She has history breast cancer that only underwent lumpectomy- declined routine therapy and is undergoing homeopathic treatment.   She has keloid below umbilicus she has had injected in [place] and has a small uh on her ct scan Patient was seen in the emergency department and admitted by Dr. Rolm Bookbinder.  She was taken to the operating room later in the a.m. 06/07/2017 by Dr. Excell Seltzer underwent laparoscopic appendectomy.  Postop she did well.  She complained of some burning on urination, urinalysis was rechecked and urine cultures pending.  Urinalysis was unremarkable.  After discussion of discharge she reported to the nurse that she has trouble with chronic constipation.  We will add MiraLAX to her discharge medications which she can take on discharge at home.  Currently her port sites all look good.  She is ambulating all over the hospital, she is tolerating regular diet and we plan discharge home today.  It does not show in this discharge summary medication list but we added MiraLAX to her home medications with instructions to follow-up with her primary care if constipation continues to be an  issue.  CBC Latest Ref Rng & Units 06/06/2017 12/27/2016 06/22/2016  WBC 4.0 - 10.5 K/uL 17.6(H) 5.8 5.4  Hemoglobin 12.0 - 15.0 g/dL 11.6(L) 10.8(L) 8.5(L)  Hematocrit 36.0 - 46.0 % 34.0(L) 32.4(L) 28.4(L)  Platelets 150 - 400 K/uL 258 218 253   CMP Latest Ref Rng & Units 06/06/2017 12/27/2016 06/22/2016  Glucose 65 - 99 mg/dL 117(H) 96 109  BUN 6 - 20 mg/dL 15 9.7 14.9  Creatinine 0.44 - 1.00 mg/dL 0.79 0.9 0.9  Sodium 135 - 145 mmol/L 135 139 137  Potassium 3.5 - 5.1 mmol/L 3.9 4.2 3.9  Chloride 101 - 111 mmol/L 103 - -  CO2 22 - 32 mmol/L 21(L) 26 24  Calcium 8.9 - 10.3 mg/dL 9.3 9.1 9.4  Total Protein 6.5 - 8.1 g/dL 7.5 6.8 7.7  Total Bilirubin 0.3 - 1.2 mg/dL 0.9 0.71 0.51  Alkaline Phos 38 - 126 U/L 58 60 56  AST 15 - 41 U/L 19 15 15   ALT 14 - 54 U/L 14 12 14   Condition on discharge: Improved  Disposition: 01-Home or Self Care   Allergies as of 06/08/2017   No Known Allergies     Medication List    TAKE these medications   acetaminophen 325 MG tablet Commonly known as:  TYLENOL Take 650 mg by mouth every 6 (six) hours as needed for mild pain or moderate pain.   FIBER SELECT GUMMIES Chew Chew 2 capsules by mouth daily.   ibuprofen 200 MG tablet Commonly known as:  ADVIL,MOTRIN You can take 2-3 tablets every 6 hours as needed for pain, use this as your first line  pain medicine.  You can alternate this with plain Tylenol also.  Use the prescribed pain medicine last.  You can buy this at any drug store over the counter.   oxyCODONE 5 MG immediate release tablet Commonly known as:  Oxy IR/ROXICODONE Take 1-2 tablets (5-10 mg total) by mouth every 4 (four) hours as needed for moderate pain.   pantoprazole 40 MG tablet Commonly known as:  PROTONIX Take 1 tablet (40 mg total) by mouth daily after breakfast.      Follow-up Information    [facility] Follow up on 06/20/2017.   Specialty:  General Surgery Why:  Your appointment is at 11:15 AM, be at the  office 30 minutes early for check-in.  Bring photo ID and insurance information. Contact information: [address] [place] [address] [phone]        Tammi Sou, MD Follow up.   Specialty:  Family Medicine Why:  Call for an appointment and follow up, especially with your Protonix. Contact information: [address] [place] [address] [phone]           Signed: Earnstine Regal 06/08/2017, 3:49 PM

## 2017-06-08 NOTE — Progress Notes (Signed)
1 Day Post-Op    CC: Abdominal pain  Subjective: Patient is complaining of normal postop abdominal discomfort.  Some discomfort in her shoulders.  Also complaining of pain with voiding.  Urinalysis was unremarkable on admit.  Port sites all look good she is tolerating a diet well.  Objective: Vital signs in last 24 hours: Temp:  [97.5 F (36.4 C)-98.9 F (37.2 C)] 98.2 F (36.8 C) (10/25 0905) Pulse Rate:  [56-81] 62 (10/25 0905) Resp:  [15-18] 18 (10/25 0905) BP: (95-107)/(50-78) 100/58 (10/25 0905) SpO2:  [96 %-100 %] 98 % (10/25 0905) Last BM Date: 06/07/17 PO 120 this AM 3700 IV 1850 urine Afebrile vital signs are stable blood pressures in the 90-100 range. No labs  Intake/Output from previous day: 10/24 0701 - 10/25 0700 In: 3686.7 [I.V.:3436.7; IV Piggyback:250] Out: 1875 [Urine:1850; Blood:25] Intake/Output this shift: Total I/O In: 120 [P.O.:120] Out: 300 [Urine:300]  General appearance: alert, cooperative and no distress Resp: clear to auscultation bilaterally GI: Soft, tender, positive bowel sounds tolerating a diet.  Port sites all look good.  Lab Results:   Recent Labs  06/06/17 1854  WBC 17.6*  HGB 11.6*  HCT 34.0*  PLT 258    BMET  Recent Labs  06/06/17 1854  NA 135  K 3.9  CL 103  CO2 21*  GLUCOSE 117*  BUN 15  CREATININE 0.79  CALCIUM 9.3   PT/INR No results for input(s): LABPROT, INR in the last 72 hours.   Recent Labs Lab 06/06/17 1854  AST 19  ALT 14  ALKPHOS 58  BILITOT 0.9  PROT 7.5  ALBUMIN 4.3     Lipase     Component Value Date/Time   LIPASE 28 06/06/2017 1854     Prior to Admission medications   Medication Sig Start Date End Date Taking? Authorizing Provider  acetaminophen (TYLENOL) 325 MG tablet Take 650 mg by mouth every 6 (six) hours as needed for mild pain or moderate pain.   Yes [provider]  FIBER SELECT GUMMIES CHEW Chew 2 capsules by mouth daily.   Yes [provider]   pantoprazole (PROTONIX) 40 MG tablet Take 1 tablet (40 mg total) by mouth 2 (two) times daily. Patient taking differently: Take 40 mg by mouth daily after breakfast.  03/30/17  Yes Pyrtle, Lajuan Lines, MD    Medications: . enoxaparin (LOVENOX) injection  40 mg Subcutaneous Q24H  . pantoprazole  40 mg Oral QPC breakfast   . sodium chloride 100 mL/hr at 06/08/17 1134  . cefTRIAXone (ROCEPHIN)  IV Stopped (06/07/17 2251)   And  . metronidazole Stopped (06/08/17 0719)    Assessment/Plan Acute appendicitis Laparoscopic appendectomy 06/07/2017 Dr. Excell Seltzer History of breast cancer History of endometriosis fibroids GERD/H. pylori and hiatal hernia  FEN: IV fluids/soft diet ID: Rocephin/Flagyl 06/07/17 -day 2 DVT: Lovenox  Plan: She is already had 1 dose of Rocephin yesterday and she has had Flagyl.  We will check her urine and recheck on her later this afternoon.  Still some discomfort, but UA unremarkable, will let her go home today.  I have personally reviewed the patients medication history on the [place] controlled substance database.  LOS: 0 days    Meagan [name] 06/08/2017 [phone]

## 2017-06-09 LAB — URINE CULTURE: Culture: NO GROWTH

## 2017-06-16 ENCOUNTER — Other Ambulatory Visit: Payer: Commercial Managed Care - PPO

## 2017-06-21 NOTE — Discharge Summary (Signed)
Patient ID: Meagan [name] MRN: [id number] DOB/AGE: 54-10-1960 54 y.o.   Admit date: 06/06/2017 Discharge date: 06/08/2017   Admission Diagnoses:  Acute appendicitis History of breast cancer History of endometriosis/fibroids History of GERD/H. pylori and hiatal hernia. Constipation   Discharge Diagnoses:  Same   Active Problems:   Appendicitis     PROCEDURES: Laparoscopic appendectomy 06/07/17 Dr. Marland Kitchen [address] [facility] Course:  56 yof with less than 24 hours of lower abdominal pain. This has been getting worse. Nothing at home helping. She has had a bm. No emesis. No fever. Has some right lower back pain also. No issues with urination. She was seen in er and underwent ct that shows appendicitis with fecalith. She has multiple surgeries mostly for fibroids.  She has history breast cancer that only underwent lumpectomy- declined routine therapy and is undergoing homeopathic treatment.   She has keloid below umbilicus she has had injected in [place] and has a small uh on her ct scan Patient was seen in the emergency department and admitted by Dr. Rolm Bookbinder.  She was taken to the operating room later in the a.m. 06/07/2017 by Dr. Excell Seltzer underwent laparoscopic appendectomy.  Postop she did well.  She complained of some burning on urination, urinalysis was rechecked and urine cultures pending.  Urinalysis was unremarkable.  After discussion of discharge she reported to the nurse that she has trouble with chronic constipation.  We will add MiraLAX to her discharge medications which she can take on discharge at home.  Currently her port sites all look good.  She is ambulating all over the hospital, she is tolerating regular diet and we plan discharge home today.  It does not show in this discharge summary medication list but we added MiraLAX to her home medications with instructions to follow-up with her primary care if constipation continues to be an issue.   CBC Latest  Ref Rng & Units 06/06/2017 12/27/2016 06/22/2016  WBC 4.0 - 10.5 K/uL 17.6(H) 5.8 5.4  Hemoglobin 12.0 - 15.0 g/dL 11.6(L) 10.8(L) 8.5(L)  Hematocrit 36.0 - 46.0 % 34.0(L) 32.4(L) 28.4(L)  Platelets 150 - 400 K/uL 258 218 253    CMP Latest Ref Rng & Units 06/06/2017 12/27/2016 06/22/2016  Glucose 65 - 99 mg/dL 117(H) 96 109  BUN 6 - 20 mg/dL 15 9.7 14.9  Creatinine 0.44 - 1.00 mg/dL 0.79 0.9 0.9  Sodium 135 - 145 mmol/L 135 139 137  Potassium 3.5 - 5.1 mmol/L 3.9 4.2 3.9  Chloride 101 - 111 mmol/L 103 - -  CO2 22 - 32 mmol/L 21(L) 26 24  Calcium 8.9 - 10.3 mg/dL 9.3 9.1 9.4  Total Protein 6.5 - 8.1 g/dL 7.5 6.8 7.7  Total Bilirubin 0.3 - 1.2 mg/dL 0.9 0.71 0.51  Alkaline Phos 38 - 126 U/L 58 60 56  AST 15 - 41 U/L 19 15 15   ALT 14 - 54 U/L 14 12 14   Condition on discharge: Improved   Disposition: 01-Home or Self Care   Allergies as of 06/08/2017   No Known Allergies               Medication List      TAKE these medications   acetaminophen 325 MG tablet Commonly known as:  TYLENOL Take 650 mg by mouth every 6 (six) hours as needed for mild pain or moderate pain.    FIBER SELECT GUMMIES Chew Chew 2 capsules by mouth daily.    ibuprofen 200 MG tablet Commonly known as:  ADVIL,MOTRIN You can take 2-3 tablets every 6 hours as needed for pain, use this as your first line pain medicine.  You can alternate this with plain Tylenol also.  Use the prescribed pain medicine last.  You can buy this at any drug store over the counter.    oxyCODONE 5 MG immediate release tablet Commonly known as:  Oxy IR/ROXICODONE Take 1-2 tablets (5-10 mg total) by mouth every 4 (four) hours as needed for moderate pain.    pantoprazole 40 MG tablet Commonly known as:  PROTONIX Take 1 tablet (40 mg total) by mouth daily after breakfast.            Follow-up Information     [facility] Follow up on 06/20/2017.   Specialty:  General Surgery Why:  Your appointment is at 11:15 AM, be  at the office 30 minutes early for check-in.  Bring photo ID and insurance information. Contact information: [address] [place] [address] [phone]            Tammi Sou, MD Follow up.   Specialty:  Family Medicine Why:  Call for an appointment and follow up, especially with your Protonix. Contact information: [address] [place] [address] [phone]                 Signed: Earnstine Regal 06/08/2017, 3:49 PM            Cosigned by: Excell Seltzer, MD at 06/08/2017  5:14 PM  Revision History

## 2017-06-22 ENCOUNTER — Other Ambulatory Visit: Payer: Self-pay | Admitting: *Deleted

## 2017-06-22 ENCOUNTER — Other Ambulatory Visit (HOSPITAL_BASED_OUTPATIENT_CLINIC_OR_DEPARTMENT_OTHER): Payer: Commercial Managed Care - PPO

## 2017-06-22 DIAGNOSIS — C50412 Malignant neoplasm of upper-outer quadrant of left female breast: Secondary | ICD-10-CM

## 2017-06-22 DIAGNOSIS — K2270 Barrett's esophagus without dysplasia: Secondary | ICD-10-CM

## 2017-06-22 DIAGNOSIS — D539 Nutritional anemia, unspecified: Secondary | ICD-10-CM

## 2017-06-22 DIAGNOSIS — Z170 Estrogen receptor positive status [ER+]: Principal | ICD-10-CM

## 2017-06-22 DIAGNOSIS — D500 Iron deficiency anemia secondary to blood loss (chronic): Secondary | ICD-10-CM

## 2017-06-22 DIAGNOSIS — D509 Iron deficiency anemia, unspecified: Secondary | ICD-10-CM | POA: Diagnosis not present

## 2017-06-22 LAB — CBC WITH DIFFERENTIAL/PLATELET
BASO%: 0.4 % (ref 0.0–2.0)
Basophils Absolute: 0 10*3/uL (ref 0.0–0.1)
EOS ABS: 0.3 10*3/uL (ref 0.0–0.5)
EOS%: 3.8 % (ref 0.0–7.0)
HEMATOCRIT: 33.2 % — AB (ref 34.8–46.6)
HEMOGLOBIN: 10.5 g/dL — AB (ref 11.6–15.9)
LYMPH#: 1.1 10*3/uL (ref 0.9–3.3)
LYMPH%: 14.8 % (ref 14.0–49.7)
MCH: 28.2 pg (ref 25.1–34.0)
MCHC: 31.6 g/dL (ref 31.5–36.0)
MCV: 89 fL (ref 79.5–101.0)
MONO#: 0.5 10*3/uL (ref 0.1–0.9)
MONO%: 7 % (ref 0.0–14.0)
NEUT%: 74 % (ref 38.4–76.8)
NEUTROS ABS: 5.5 10*3/uL (ref 1.5–6.5)
Platelets: 302 10*3/uL (ref 145–400)
RBC: 3.73 10*6/uL (ref 3.70–5.45)
RDW: 12.9 % (ref 11.2–14.5)
WBC: 7.4 10*3/uL (ref 3.9–10.3)

## 2017-06-22 LAB — COMPREHENSIVE METABOLIC PANEL
ALBUMIN: 3.9 g/dL (ref 3.5–5.0)
ALK PHOS: 58 U/L (ref 40–150)
ALT: 22 U/L (ref 0–55)
AST: 18 U/L (ref 5–34)
Anion Gap: 10 mEq/L (ref 3–11)
BUN: 11.2 mg/dL (ref 7.0–26.0)
CO2: 23 meq/L (ref 22–29)
Calcium: 9.3 mg/dL (ref 8.4–10.4)
Chloride: 105 mEq/L (ref 98–109)
Creatinine: 0.8 mg/dL (ref 0.6–1.1)
EGFR: >60 mL/min/{1.73_m2} (ref >60)
GLUCOSE: 109 mg/dL (ref 70–140)
POTASSIUM: 3.9 meq/L (ref 3.5–5.1)
SODIUM: 138 meq/L (ref 136–145)
Total Bilirubin: 0.67 mg/dL (ref 0.20–1.20)
Total Protein: 7.4 g/dL (ref 6.4–8.3)

## 2017-06-22 LAB — IRON AND TIBC
%SAT: 6 % — AB (ref 21–57)
Iron: 23 ug/dL — ABNORMAL LOW (ref 41–142)
TIBC: 396 ug/dL (ref 236–444)
UIBC: 373 ug/dL (ref 120–384)

## 2017-06-22 LAB — FERRITIN: Ferritin: 11 ng/ml (ref 9–269)

## 2017-06-23 LAB — CANCER ANTIGEN 27.29: CA 27.29: 16.1 U/mL (ref 0.0–38.6)

## 2017-06-26 NOTE — Progress Notes (Signed)
Meagan [name]  [phone] [phone]     ID: CAEDYN TASSINARI DOB: 1960/10/07  MR#: [id number]  QMV#:[id number]  Patient Care Team: Meagan Sou, MD as PCP - General (Family Medicine) Erroll Luna, MD as Consulting Physician (General Surgery) Heyli Min, Virgie Dad, MD as Consulting Physician (Oncology) Sylvan Cheese, NP as Nurse Practitioner (Hematology and Oncology) Pyrtle, Lajuan Lines, MD as Consulting Physician (Gastroenterology) Excell Seltzer, MD as Consulting Physician (General Surgery) PCP: Meagan Sou, MD GYN: Meagan Pearson MD OTHER MD: Thea Silversmith MD  CHIEF COMPLAINT:  Triple positive breast cancer  CURRENT TREATMENT: Observation   BREAST CANCER HISTORY: From the original intake note:  Meagan [name] had screening mammography in September 2015 showing left breast calcifications. She was recalled for diagnostic left mammogram 05/06/2014 at the breast Center. This found the breast density to be category C. Magnified views found the calcifications to be consistent with "milk of calcium". Accordingly she was set up for routine screening mammography a year later. However, sometime in July 2016 she noted a bump in her left breast. She eventually brought it to the attention of her primary care physician, who felt some areas of concern in the upper outer quadrants of both breasts.  Screening mammography 06/09/2015 was unremarkable. Bilateral diagnostic mammography with tomography at the breast Center 06/11/2015 found a 7 mm benign cyst in the right breast at the 10:00 position. In the left breast there was a hypoechoic oval mass at the 1:00 position 3 cm from the nipple measuring 10 mm. Biopsy of the left breast mass 06/26/2015 showed ([id number]) an invasive ductal carcinoma, E-cadherin positive, grade 2, estrogen receptor 80% positive, progesterone receptor 90% positive, both with strong staining intensity, with an MIB-1 of 5%, and HER-2  amplified, the signals ratio being 2.27 and the number per cell 5.45.  Bilateral breast MRI 07/06/2015 found a 1 cm mass in the upper outer left breast with central clip artifact. Laterally and inferior to this there was a TRAM track area of enhancement. This was consistent with benign biopsy tract changes. There were no abnormal appearing lymph nodes. There were 2 subcentimeter foci in the liver which were noted to be hepatic cysts in a prior abdominal CT  Her subsequent history is as detailed below.   INTERVAL HISTORY: Meagan [name] returns today for follow-up of her estrogen receptor positive breast cancer. She is accompanied by her significant other, Meagan [name]. She has refused adjuvant antiestrogens and is under observation alone.  Since her last visit here she had a CT of the abdomen and pelvis was obtained 06/06/2017 for evaluation of nausea and vomiting.  This showed acute appendicitis, as well as an enlarged leiomyomatous uterus.  She proceeded to laparoscopic appendectomy June 07, 2017 under Dr. Excell Seltzer. She tolerated surgery well.   REVIEW OF SYSTEMS: Meagan [name] is doing well. She notes feeling nauseous around 10 am everyday, and wonders if it is because she eats breakfast at 7:30 am and not again until lunch at 1 pm. Meagan [name] states she is worried about her left shoulder pain that seems to be more in the muscle. Her pain is exacerbated when bending forward. Pt walks everyday for about 45 minutes. She also has been eating more salads and soups with chicken and vegetables. She denies unusual headaches, visual changes, vomiting, or dizziness. There has been no unusual cough, phlegm production, or pleurisy. This been no change in bowel or bladder habits. She denies unexplained fatigue or unexplained weight loss, bleeding, rash, or fever. A  detailed review of systems was otherwise entirely stable.   PAST MEDICAL HISTORY: Past Medical History:  Diagnosis Date  . Arthritis of hand   . Barrett esophagus 06/2016    Needs repeat EGD 06/2019 (Dr. Hilarie Fredrickson)  . Breast cancer, left ([facility]) 06/26/15   1 cm mass, + core bx; invasive ductal carcinoma: suggested tx plan was breast conservation surgery followed by HER-2 based therapy with accompanying chemotherapy, as well as radiation, followed by anti-estrogens.  However, as of 08/2015 f/u with Dr. Jana Hakim she has declined ALL chemo/med/radiation tx's and wants her port removed.  . Endometrial polyp   . Endometriosis   . Fibroids   . GERD (gastroesophageal reflux disease)    +Barretts esophagus on endo 09/2012  . H/O hiatal hernia   . Helicobacter pylori (H. pylori)    positive serology 07/2012  . History of iron deficiency anemia    pt does not tolerate oral iron; plan for feraheme per oncologist as of 06/2016  . Hyperlipidemia   . Menorrhagia    has led to iron def anemia  . Rosacea   . SAB (spontaneous abortion)   . Shortness of breath    on exertion  . Sleep apnea    no CPAP    PAST SURGICAL HISTORY: Past Surgical History:  Procedure Laterality Date  . BREAST BIOPSY  06/29/15   Core needle: + invasive mammary carcinoma  . BREAST LUMPECTOMY WITH RADIOACTIVE SEED AND SENTINEL LYMPH NODE BIOPSY Left 07/28/2015   Path: invasive ductal carcinoma, neg margins, sentinal LN x 3 neg for tumor.  Procedure: LEFT BREAST LUMPECTOMY WITH RADIOACTIVE SEED AND SENTINEL LYMPH NODE BIOPSY;  Surgeon: Erroll Luna, MD;  Location: [facility];  Service: General;  Laterality: Left;  . D & C HYSTEROSCOPY/ POLYPECTOMY/ DX LAPAROSCOPY/ LYSIS ADHESIONS/ ABLATION ENDOMETRIOSIS  04-10-2009  . DILATATION & CURETTAGE/HYSTEROSCOPY WITH TRUECLEAR N/A 03/29/2013   Procedure: DILATATION & CURETTAGE/HYSTEROSCOPY WITH TRUECLEAR;  Surgeon: Meagan Pearson, MD;  Location: [facility];  Service: Gynecology;  Laterality: N/A;  . DILATION AND CURETTAGE OF UTERUS    . ESOPHAGOGASTRODUODENOSCOPY  10/01/12; 06/2016   chronic inactive gastritis (H. pylori neg, no dysplasia or malig),+  Barretts esophagus (Dr. Hilarie Fredrickson).  2017 repeat showed Barrett's esoph, o/w normal.  Repeat 3 yrs recommended.  . EXP. LAP. LEFT OVARIAN CYSTECTOMY / LYSIS ADHESIONS/ MYOMECTOMY  2008  . HYSTEROSCOPY  2010  . HYSTEROSCOPY W/D&C  08/25/2011   Procedure: DILATATION AND CURETTAGE /HYSTEROSCOPY;  Surgeon: Meagan [name];  Location: [facility];  Service: Gynecology;;  . LAPAROSCOPIC APPENDECTOMY N/A 06/07/2017   Procedure: APPENDECTOMY LAPAROSCOPIC;  Surgeon: Excell Seltzer, MD;  Location: [facility];  Service: General;  Laterality: N/A;  . MYOMECTOMY    . MYOMECTOMY ABDOMINAL APPROACH    . POLYPECTOMY  2013   ENDOMETRIAL POLYP  . PORTACATH PLACEMENT Right 07/28/2015   Procedure: INSERTION PORT-A-CATH WITH ULTRA SOUND;  Surgeon: Erroll Luna, MD;  Location: [facility];  Service: General;  Laterality: Right;  . WISDOM TOOTH EXTRACTION      FAMILY HISTORY Family History  Problem Relation Age of Onset  . Prostate cancer Father   . Hypertension Father   . Anuerysm Father   . Heart disease Father   . Heart disease Mother   . Fibroids Sister        uterine  . Uterine cancer Maternal Grandmother   . Heart disease Maternal Aunt   . Heart disease Maternal Uncle   . Heart disease Paternal Uncle   .  Stomach cancer Other        cousin  . Diabetes Other        half sister and brother  . Esophageal cancer Neg Hx   . Colon cancer Neg Hx    the patient's father died from a brain aneurysm at the age of 42. The patient's mother is living, age 46. The patient has 3 brothers, one sister. The patient's father was diagnosed with prostate cancer shortly before his death. On the mother'side, the grandmother may have had uterine cancer. The patient is not aware of any breast or ovarian cancers in the family  GYNECOLOGIC HISTORY:  No LMP recorded. Menarche age 21, the patient is G4 P0. She tells me she had 4 miscarriages felt to be due to fibroids. She had a myomectomy  procedure but then was unable to become pregnant. She still having regular periods. At this point however she feels she is "too old" to have children and if she became menopausal with treatment that would not be a problem  SOCIAL HISTORY:  Meagan [name] works for [employer]. She is originally from [place], [place]. She is married but separated. Her significant other, Meagan [name], is [place]. He also works for [employer].    ADVANCED DIRECTIVES: We discussed the fact that if the patient is not legally divorced, her husband, though separated, might claim to be her healthcare power of attorney. Accordingly I have given her the appropriate forms for her to complete and notarize at her discretion.   HEALTH MAINTENANCE: Social History   Tobacco Use  . Smoking status: Never Smoker  . Smokeless tobacco: Never Used  Substance Use Topics  . Alcohol use: No  . Drug use: No     Colonoscopy: 08/13/2013/Pyrtle  PAP: [facility]  Bone density:  Lipid panel:  No Known Allergies  Current Outpatient Medications  Medication Sig Dispense Refill  . acetaminophen (TYLENOL) 325 MG tablet Take 650 mg by mouth every 6 (six) hours as needed for mild pain or moderate pain.    Marland Kitchen FIBER SELECT GUMMIES CHEW Chew 2 capsules by mouth daily.    Marland Kitchen ibuprofen (ADVIL,MOTRIN) 200 MG tablet You can take 2-3 tablets every 6 hours as needed for pain, use this as your first line pain medicine.  You can alternate this with plain Tylenol also.  Use the prescribed pain medicine last.  You can buy this at any drug store over the counter.    Marland Kitchen oxyCODONE (OXY IR/ROXICODONE) 5 MG immediate release tablet Take 1-2 tablets (5-10 mg total) by mouth every 4 (four) hours as needed for moderate pain. 15 tablet 0  . pantoprazole (PROTONIX) 40 MG tablet Take 1 tablet (40 mg total) by mouth daily after breakfast.    . polyethylene glycol powder (MIRALAX) powder Use daily as needed to correct constipation.  Follow package instructions.  If this  continues to be a problem see your primary care physician for further evaluation and treatment.  You can buy this over-the-counter at any drugstore.     No current facility-administered medications for this visit.     OBJECTIVE: Middle-aged Latin American woman in no acute distress  Vitals:   06/29/17 1436  BP: 105/64  Pulse: 65  Resp: 18  Temp: (!) 97.4 F (36.3 C)  SpO2: 100%     Body mass index is 22.44 kg/m.    ECOG FS:1 - Symptomatic but completely ambulatory  Sclerae unicteric, EOMs intact Oropharynx clear and moist No cervical or supraclavicular adenopathy Lungs no rales  or rhonchi Heart regular rate and rhythm Abd soft, nontender, positive bowel sounds MSK no focal spinal tenderness, no upper extremity lymphedema; careful palpation of the entire scapular area on the left shows no mass or tenderness Neuro: nonfocal, well oriented, positive affect Breasts: The right breast is unremarkable.  The left breast is undergone lumpectomy.  There is no evidence of local recurrence.  Both axillae are benign.  LAB RESULTS:  CMP     Component Value Date/Time   NA 138 06/22/2017 1553   K 3.9 06/22/2017 1553   CL 103 06/06/2017 1854   CO2 23 06/22/2017 1553   GLUCOSE 109 06/22/2017 1553   BUN 11.2 06/22/2017 1553   CREATININE 0.8 06/22/2017 1553   CALCIUM 9.3 06/22/2017 1553   PROT 7.4 06/22/2017 1553   ALBUMIN 3.9 06/22/2017 1553   AST 18 06/22/2017 1553   ALT 22 06/22/2017 1553   ALKPHOS 58 06/22/2017 1553   BILITOT 0.67 06/22/2017 1553   GFRNONAA >60 06/06/2017 1854   GFRAA >60 06/06/2017 1854    INo results found for: SPEP, UPEP  Lab Results  Component Value Date   WBC 7.4 06/22/2017   NEUTROABS 5.5 06/22/2017   HGB 10.5 (L) 06/22/2017   HCT 33.2 (L) 06/22/2017   MCV 89.0 06/22/2017   PLT 302 06/22/2017      Chemistry      Component Value Date/Time   NA 138 06/22/2017 1553   K 3.9 06/22/2017 1553   CL 103 06/06/2017 1854   CO2 23 06/22/2017 1553   BUN  11.2 06/22/2017 1553   CREATININE 0.8 06/22/2017 1553      Component Value Date/Time   CALCIUM 9.3 06/22/2017 1553   ALKPHOS 58 06/22/2017 1553   AST 18 06/22/2017 1553   ALT 22 06/22/2017 1553   BILITOT 0.67 06/22/2017 1553       No results found for: LABCA2  No components found for: LABCA125  No results for input(s): INR in the last 168 hours.  Urinalysis    Component Value Date/Time   COLORURINE YELLOW 06/08/2017 [place] 06/08/2017 1152   LABSPEC 1.020 06/08/2017 1152   PHURINE 5.0 06/08/2017 1152   GLUCOSEU NEGATIVE 06/08/2017 1152   HGBUR SMALL (A) 06/08/2017 1152   BILIRUBINUR NEGATIVE 06/08/2017 1152   KETONESUR NEGATIVE 06/08/2017 1152   PROTEINUR NEGATIVE 06/08/2017 1152   UROBILINOGEN 0.2 05/02/2015 1455   NITRITE NEGATIVE 06/08/2017 1152   LEUKOCYTESUR SMALL (A) 06/08/2017 1152    STUDIES: Ct Abdomen Pelvis W Contrast  Result Date: 06/07/2017 CLINICAL DATA:  Abdominal and back pain for 1 day. Nausea, vomiting and constipation. History of breast cancer, myomectomies. EXAM: CT ABDOMEN AND PELVIS WITH CONTRAST TECHNIQUE: Multidetector CT imaging of the abdomen and pelvis was performed using the standard protocol following bolus administration of intravenous contrast. CONTRAST:  100 cc Isovue-300 COMPARISON:  CT abdomen and pelvis May 02, 2015 FINDINGS: LOWER CHEST: Dependent atelectasis. Included heart size is normal. No pericardial effusion. HEPATOBILIARY: Stable scattered subcentimeter probable cysts in the liver. Trace periportal edema. Normal gallbladder. PANCREAS: Normal. SPLEEN: Stable subcentimeter cyst or lymphangioma in the spleen. ADRENALS/URINARY TRACT: Kidneys are orthotopic, demonstrating symmetric enhancement. No nephrolithiasis, hydronephrosis or solid renal masses. The unopacified ureters are normal in course and caliber. Delayed imaging through the kidneys demonstrates symmetric prompt contrast excretion within the proximal  urinary collecting system. Urinary bladder is partially distended and unremarkable. Normal adrenal glands. STOMACH/BOWEL: Small hiatal hernia. The stomach, small and large bowel are normal in  course and caliber without inflammatory changes. The appendix is enlarged, LEFT end mm with periappendiceal inflammation and wall thickening. 5 mm appendicolith at the origin. VASCULAR/LYMPHATIC: Aortoiliac vessels are normal in course and caliber. No lymphadenopathy by CT size criteria. REPRODUCTIVE: Enlarged uterus. Multiple uterine leiomyoma, varying from cystic to calcified, including 5 cm RIGHT subserosal leiomyoma, previously 7 cm. OTHER: Small amount of free fluid LEFT pelvis. No focal fluid collection or intraperitoneal free air. MUSCULOSKELETAL: Nonacute.  Small fat containing umbilical hernia. IMPRESSION: 1. Acute appendicitis without complication. 2. Enlarged leiomyomatous uterus. 3. Acute findings discussed with and reconfirmed by PA.HANNAH MUTHERSBAUGH on 06/06/2017 at 12:15 am. Electronically Signed   By: Elon Alas M.D.   On: 06/07/2017 00:17    ASSESSMENT: 56 y.o. [facility] woman status post left breast biopsy 06/26/2015 for a clinical T1b N0, stage IA invasive ductal carcinoma, grade 2, estrogen and progesterone receptor positive, HER-2 amplified, with a low MIB-1  (1) status post left lumpectomy and sentinel lymph node sampling 07/28/2015 for a  pT1b pN0, stage IA invasive ductal carcinoma, with negative margins.   (2) standard therapy (antiestrogens and  immunotherapy with chemotherapy followed by radiation and anti-estrogens)  refused by patient after extensive discussion 08/22/2015  (3) iron deficiency anemia secondary to menorrhagia, associated with fibroids   (a) status post Feraheme June 2018 (x2)  (b) scheduled for Feraheme 07/14/2017 and 07/21/2017  PLAN: Rossie is now just about 2 years out from definitive surgery for her breast cancer with no evidence of disease recurrence.  This is  favorable.  She remains at high risk since she has not accepted standard of care.  I think the discomfort she has in the left scapular area is going to be musculoskeletal.  We are obtaining a chest x-ray today to document that.  I expect the problem will have resolved by the time she returns here for Feraheme infusions 07/14/2017.  If it has not we will proceed to a CT scan of the chest.  Note that she did have a CT of the abdomen and pelvis October of this year which showed no evidence of metastatic disease.  She is scheduled for bilateral mammography with tomography and left axillary ultrasonography July 04, 2017   .  Assuming all goes well she will see me again in May.  She knows to call for any other issues that may develop before the next  Ab Leaming, Virgie Dad, MD  06/29/17 3:15 PM Medical Oncology and Hematology [facility] [address] [place], [place] [address] Tel. [phone]    Fax. [phone]  This document serves as a record of services personally performed by Chauncey Cruel, MD. It was created on his behalf by Margit Banda, a trained medical scribe. The creation of this record is based on the scribe's personal observations and the provider's statements to them.   I have reviewed the above documentation for accuracy and completeness, and I agree with the above.

## 2017-06-29 ENCOUNTER — Telehealth: Payer: Self-pay | Admitting: Oncology

## 2017-06-29 ENCOUNTER — Ambulatory Visit (HOSPITAL_BASED_OUTPATIENT_CLINIC_OR_DEPARTMENT_OTHER): Payer: Commercial Managed Care - PPO | Admitting: Oncology

## 2017-06-29 VITALS — BP 105/64 | HR 65 | Temp 97.4°F | Resp 18 | Ht 63.0 in | Wt 126.7 lb

## 2017-06-29 DIAGNOSIS — D500 Iron deficiency anemia secondary to blood loss (chronic): Secondary | ICD-10-CM

## 2017-06-29 DIAGNOSIS — M25512 Pain in left shoulder: Secondary | ICD-10-CM | POA: Diagnosis not present

## 2017-06-29 DIAGNOSIS — D259 Leiomyoma of uterus, unspecified: Secondary | ICD-10-CM

## 2017-06-29 DIAGNOSIS — R110 Nausea: Secondary | ICD-10-CM

## 2017-06-29 DIAGNOSIS — C50412 Malignant neoplasm of upper-outer quadrant of left female breast: Secondary | ICD-10-CM

## 2017-06-29 DIAGNOSIS — Z170 Estrogen receptor positive status [ER+]: Principal | ICD-10-CM

## 2017-06-29 DIAGNOSIS — Z853 Personal history of malignant neoplasm of breast: Secondary | ICD-10-CM

## 2017-06-29 NOTE — Telephone Encounter (Signed)
Called patient regarding current schedule Dr. Michela Pitcher it was ok to change to 12/14

## 2017-07-01 ENCOUNTER — Other Ambulatory Visit: Payer: Self-pay | Admitting: Internal Medicine

## 2017-07-04 ENCOUNTER — Other Ambulatory Visit: Payer: Self-pay | Admitting: Oncology

## 2017-07-04 ENCOUNTER — Ambulatory Visit
Admission: RE | Admit: 2017-07-04 | Discharge: 2017-07-04 | Disposition: A | Discharge status: Home / Self Care | Payer: Commercial Managed Care - PPO | Source: Ambulatory Visit | Attending: Obstetrics and Gynecology | Admitting: Obstetrics and Gynecology

## 2017-07-04 ENCOUNTER — Other Ambulatory Visit: Payer: Commercial Managed Care - PPO

## 2017-07-04 DIAGNOSIS — N6320 Unspecified lump in the left breast, unspecified quadrant: Secondary | ICD-10-CM

## 2017-07-09 ENCOUNTER — Encounter: Payer: Self-pay | Admitting: Family Medicine

## 2017-07-18 ENCOUNTER — Telehealth: Payer: Self-pay

## 2017-07-18 NOTE — Telephone Encounter (Signed)
RN. Elmyra Ricks approved date change for patient. Per 12/4 call que

## 2017-07-21 ENCOUNTER — Ambulatory Visit: Payer: Commercial Managed Care - PPO

## 2017-07-28 ENCOUNTER — Other Ambulatory Visit: Payer: Self-pay | Admitting: Oncology

## 2017-07-28 ENCOUNTER — Ambulatory Visit (HOSPITAL_COMMUNITY)
Admission: RE | Admit: 2017-07-28 | Discharge: 2017-07-28 | Disposition: A | Discharge status: Home / Self Care | Payer: Commercial Managed Care - PPO | Source: Ambulatory Visit | Attending: Oncology | Admitting: Oncology

## 2017-07-28 ENCOUNTER — Telehealth: Payer: Self-pay | Admitting: Oncology

## 2017-07-28 ENCOUNTER — Ambulatory Visit (HOSPITAL_BASED_OUTPATIENT_CLINIC_OR_DEPARTMENT_OTHER): Payer: Commercial Managed Care - PPO

## 2017-07-28 ENCOUNTER — Ambulatory Visit: Payer: Commercial Managed Care - PPO

## 2017-07-28 ENCOUNTER — Other Ambulatory Visit: Payer: Self-pay

## 2017-07-28 VITALS — BP 97/57 | HR 65 | Temp 97.8°F | Resp 16

## 2017-07-28 DIAGNOSIS — M25512 Pain in left shoulder: Secondary | ICD-10-CM

## 2017-07-28 DIAGNOSIS — Z170 Estrogen receptor positive status [ER+]: Principal | ICD-10-CM

## 2017-07-28 DIAGNOSIS — D509 Iron deficiency anemia, unspecified: Secondary | ICD-10-CM | POA: Diagnosis not present

## 2017-07-28 DIAGNOSIS — D500 Iron deficiency anemia secondary to blood loss (chronic): Secondary | ICD-10-CM

## 2017-07-28 DIAGNOSIS — D539 Nutritional anemia, unspecified: Secondary | ICD-10-CM

## 2017-07-28 DIAGNOSIS — C50412 Malignant neoplasm of upper-outer quadrant of left female breast: Secondary | ICD-10-CM

## 2017-07-28 MED ORDER — FERUMOXYTOL INJECTION 510 MG/17 ML
510.0000 mg | Freq: Once | INTRAVENOUS | Status: AC
  Administered 2017-07-28: 510 mg via INTRAVENOUS
  Filled 2017-07-28: qty 17

## 2017-07-28 MED ORDER — SODIUM CHLORIDE 0.9 % IV SOLN
INTRAVENOUS | Status: DC
  Administered 2017-07-28: 15:00:00 via INTRAVENOUS

## 2017-07-28 NOTE — Patient Instructions (Signed)

## 2017-07-28 NOTE — Telephone Encounter (Signed)
Patient came in with pain in her left shoulder she wanted xray

## 2017-07-28 NOTE — Progress Notes (Signed)
Call the patient with the results of her shoulder films she tells me the real problem is the scapula not the shoulder I have set her up for a chest x-ray 1221.  She tells me she has a "bleeder" in the surgical arm.  I think she will benefit from physical therapy and I have placed that order as well.

## 2017-08-04 ENCOUNTER — Other Ambulatory Visit: Payer: Self-pay | Admitting: Oncology

## 2017-08-04 ENCOUNTER — Ambulatory Visit (HOSPITAL_COMMUNITY)
Admission: RE | Admit: 2017-08-04 | Discharge: 2017-08-04 | Disposition: A | Discharge status: Home / Self Care | Payer: Commercial Managed Care - PPO | Source: Ambulatory Visit | Attending: Oncology | Admitting: Oncology

## 2017-08-04 ENCOUNTER — Ambulatory Visit (HOSPITAL_BASED_OUTPATIENT_CLINIC_OR_DEPARTMENT_OTHER): Payer: Commercial Managed Care - PPO

## 2017-08-04 VITALS — BP 109/64 | HR 63 | Temp 97.7°F | Resp 17

## 2017-08-04 DIAGNOSIS — D509 Iron deficiency anemia, unspecified: Secondary | ICD-10-CM

## 2017-08-04 DIAGNOSIS — D500 Iron deficiency anemia secondary to blood loss (chronic): Secondary | ICD-10-CM

## 2017-08-04 DIAGNOSIS — D539 Nutritional anemia, unspecified: Secondary | ICD-10-CM

## 2017-08-04 DIAGNOSIS — Z170 Estrogen receptor positive status [ER+]: Secondary | ICD-10-CM | POA: Diagnosis not present

## 2017-08-04 DIAGNOSIS — C50412 Malignant neoplasm of upper-outer quadrant of left female breast: Secondary | ICD-10-CM | POA: Diagnosis not present

## 2017-08-04 MED ORDER — SODIUM CHLORIDE 0.9 % IV SOLN
510.0000 mg | Freq: Once | INTRAVENOUS | Status: AC
  Administered 2017-08-04: 510 mg via INTRAVENOUS
  Filled 2017-08-04: qty 17

## 2017-08-04 MED ORDER — SODIUM CHLORIDE 0.9 % IV SOLN
Freq: Once | INTRAVENOUS | Status: AC
  Administered 2017-08-04: 10:00:00 via INTRAVENOUS

## 2017-08-04 NOTE — Patient Instructions (Signed)

## 2017-08-07 ENCOUNTER — Telehealth: Payer: Self-pay

## 2017-08-07 NOTE — Telephone Encounter (Signed)
Spoke with patient to let her know that chest xray was normal.  She voiced understanding with no questions or concerns at this time.

## 2017-08-07 NOTE — Telephone Encounter (Signed)
-----   Message from Gardenia Phlegm, NP sent at 08/07/2017  8:37 AM EST ----- Chest xray normal, please notify patient ----- Message ----- From: Interface, Rad Results In Sent: 08/04/2017   3:46 PM To: Chauncey Cruel, MD

## 2017-08-09 ENCOUNTER — Telehealth: Payer: Self-pay | Admitting: *Deleted

## 2017-08-09 NOTE — Telephone Encounter (Signed)
This RN received VM from pt stating " I would like to be seen by Faustino Congress for my PT ".  This RN discussed with pt need to be evaluated by a PT who is has a certification in lymphedema related to breast surgery due to concern for possible post surgical issues.  She can inform PT tomorrow that she would like to be seen by other PT- and if appropriate they can refer to other PT.  Saira verbalized understanding.

## 2017-08-10 ENCOUNTER — Ambulatory Visit: Payer: Commercial Managed Care - PPO | Attending: Oncology | Admitting: Physical Therapy

## 2017-08-10 ENCOUNTER — Encounter: Payer: Self-pay | Admitting: Physical Therapy

## 2017-08-10 ENCOUNTER — Other Ambulatory Visit: Payer: Self-pay

## 2017-08-10 DIAGNOSIS — R531 Weakness: Secondary | ICD-10-CM | POA: Diagnosis present

## 2017-08-10 DIAGNOSIS — M79602 Pain in left arm: Secondary | ICD-10-CM | POA: Diagnosis not present

## 2017-08-10 DIAGNOSIS — I890 Lymphedema, not elsewhere classified: Secondary | ICD-10-CM | POA: Insufficient documentation

## 2017-08-10 NOTE — Therapy (Signed)
[place], [place], [address] Phone: [phone]   Fax:  [phone]  Physical Therapy Evaluation  Patient Details  Name: Meagan [name] MRN: [id number] Date of Birth: May 22, 1961 Referring Provider: Dr. Jana Hakim    Encounter Date: 08/10/2017  PT End of Session - 08/10/17 1800    Visit Number  1    Number of Visits  9    Date for PT Re-Evaluation  09/10/17    PT Start Time  1100    PT Stop Time  1155    PT Time Calculation (min)  55 min    Activity Tolerance  Patient tolerated treatment well    Behavior During Therapy  [facility] for tasks assessed/performed       Past Medical History:  Diagnosis Date  . Arthritis of hand   . 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 Hilarie Fredrickson)  . Breast cancer, left ([place]) 06/26/2015   1 cm mass, + core bx; invasive ductal carcinoma: suggested tx plan was breast conservation surgery followed by HER-2 based therapy with accompanying chemotherapy, as well as radiation, followed by anti-estrogens.  However, as of 08/2015 f/u with Dr. Jana Hakim she has declined ALL chemo/med/radiation tx's and wants her port removed.  No sign of dz recurrence on onc f/u 06/2017.  . Endometrial polyp   . Endometriosis   . Fibroids   . GERD (gastroesophageal reflux disease)    +Barretts esophagus on endo 09/2012  . H/O hiatal hernia   . Helicobacter pylori (H. pylori)    positive serology 07/2012  . History of iron deficiency anemia    pt does not tolerate oral iron; plan for feraheme per oncologist as of 06/2016  . Hyperlipidemia   . Menorrhagia    has led to iron def anemia  . Rosacea   . SAB (spontaneous abortion)   . Shortness of breath    on exertion  . Sleep apnea    no CPAP    Past Surgical History:  Procedure Laterality Date  . BREAST BIOPSY  06/29/15   Core needle: + invasive mammary carcinoma  . BREAST LUMPECTOMY WITH RADIOACTIVE SEED AND SENTINEL LYMPH NODE  BIOPSY Left 07/28/2015   Path: invasive ductal carcinoma, neg margins, sentinal LN x 3 neg for tumor.  Procedure: LEFT BREAST LUMPECTOMY WITH RADIOACTIVE SEED AND SENTINEL LYMPH NODE BIOPSY;  Surgeon: Erroll Luna, MD;  Location: [facility];  Service: General;  Laterality: Left;  . D & C HYSTEROSCOPY/ POLYPECTOMY/ DX LAPAROSCOPY/ LYSIS ADHESIONS/ ABLATION ENDOMETRIOSIS  04-10-2009  . DILATATION & CURETTAGE/HYSTEROSCOPY WITH TRUECLEAR N/A 03/29/2013   Procedure: DILATATION & CURETTAGE/HYSTEROSCOPY WITH TRUECLEAR;  Surgeon: Marylynn Pearson, MD;  Location: [facility];  Service: Gynecology;  Laterality: N/A;  . DILATION AND CURETTAGE OF UTERUS    . ESOPHAGOGASTRODUODENOSCOPY  10/01/12; 06/2016   chronic inactive gastritis (H. pylori neg, no dysplasia or malig),+ Barretts esophagus (Dr. Hilarie Fredrickson).  2017 repeat showed Barrett's esoph, o/w normal.  Repeat 3 yrs recommended.  . EXP. LAP. LEFT OVARIAN CYSTECTOMY / LYSIS ADHESIONS/ MYOMECTOMY  2008  . HYSTEROSCOPY  2010  . HYSTEROSCOPY W/D&C  08/25/2011   Procedure: DILATATION AND CURETTAGE /HYSTEROSCOPY;  Surgeon: Marylynn Pearson;  Location: [facility];  Service: Gynecology;;  . LAPAROSCOPIC APPENDECTOMY N/A 06/07/2017   Procedure: APPENDECTOMY LAPAROSCOPIC;  Surgeon: Excell Seltzer, MD;  Location: [facility];  Service: General;  Laterality: N/A;  . MYOMECTOMY    . MYOMECTOMY ABDOMINAL APPROACH    .  POLYPECTOMY  2013   ENDOMETRIAL POLYP  . PORTACATH PLACEMENT Right 07/28/2015   Procedure: INSERTION PORT-A-CATH WITH ULTRA SOUND;  Surgeon: Erroll Luna, MD;  Location: [facility];  Service: General;  Laterality: Right;  . WISDOM TOOTH EXTRACTION      There were no vitals filed for this visit.   Subjective Assessment - 08/10/17 1114    Subjective  pain in back and shoulder blade and left shoulder and upper arm .  She says the symptoms started after curling up on the couch holding her computer in her lap.  She  also reports symptoms of pain in her knees and thickening around the DIP joints of left hand.  She is worried she has degenerative arthritis.  Pt is inquiring about getting dry needling with Meagan [name], PT    Patient is accompained by:  Family member attentive husband     Pertinent History  left breast cancer with lumpectomy in 2016 with lymph nodes removed.   No chemo or radiation .    Patient Stated Goals  to get rid of the pain     Currently in Pain?  Yes    Pain Score  6     Pain Location  Back    Pain Orientation  Left    Pain Descriptors / Indicators  Spasm    Pain Radiating Towards  left shoulder and sometimes better     Pain Onset  More than a month ago    Pain Frequency  Constant    Aggravating Factors   not sure, it doesn't go away     Pain Relieving Factors  tylenol makes it better, but she doesn't want to take it     Effect of Pain on Daily Activities  not too much , it just puts me in a bad mood          [facility] PT Assessment - 08/10/17 0001      Assessment   Medical Diagnosis  left breast cancer     Referring Provider  Dr. Jana Hakim     Onset Date/Surgical Date  07/28/15    Hand Dominance  Right      Precautions   Precautions  Other (comment)    Precaution Comments  none      Restrictions   Weight Bearing Restrictions  No      Balance Screen   Has the patient fallen in the past 6 months  No    Has the patient had a decrease in activity level because of a fear of falling?   No    Is the patient reluctant to leave their home because of a fear of falling?   No      Home Social worker  Private residence    Living Arrangements  Spouse/significant other    Available Help at Discharge  Available PRN/intermittently      Prior Function   Level of Independence  Independent    Vocation  Full time employment    Vocation Requirements  works at the [employer] , works  at an office     Leisure  walk every day 45 minutes to an hour        Cognition   Overall Cognitive Status  Within Functional Limits for tasks assessed      Observation/Other Assessments   Observations  pt with well healed scar with keloid formation at  left breast and right upper chest ( site of port placement and  removal for port that was not used)     Skin Integrity  intact     Other Surveys   Other Surveys    Quick DASH   22.73      Sensation   Light Touch  Not tested    Additional Comments  reports vague symptoms in left arm       Coordination   Gross Motor Movements are Fluid and Coordinated  Yes      Posture/Postural Control   Posture/Postural Control  No significant limitations    Posture Comments  generalized decrease muscle tone.  Pt states she is "sedentary"       ROM / Strength   AROM / PROM / Strength  AROM;Strength      AROM   Overall AROM   Within functional limits for tasks performed    Overall AROM Comments  c/o soreness in left shoulder with limits of shoulder flexion and external rotation       Strength   Overall Strength  Deficits    Overall Strength Comments  appears to have muscle atrophy in left arm and scapular area.  Husband reports pt does not use left arm much at all for functional activities.     Strength Assessment Site  Shoulder    Right/Left Shoulder  Right;Left    Right Shoulder Flexion  4/5    Right Shoulder ABduction  4/5    Left Shoulder Flexion  3/5    Left Shoulder ABduction  3/5      Palpation   Palpation comment  tight tender muscle spasm in left paraspinals just below scapula, mild firmness in left upper breast         LYMPHEDEMA/ONCOLOGY QUESTIONNAIRE - 08/10/17 1155      Right Upper Extremity Lymphedema   10 cm Proximal to Olecranon Process  24.5 cm    Olecranon Process  23 cm    15 cm Proximal to Ulnar Styloid Process  22.6 cm    Just Proximal to Ulnar Styloid Process  14.5 cm    Across Hand at [employer]  17 cm    At [place] of 2nd Digit  5.3 cm      Left Upper Extremity Lymphedema   10  cm Proximal to Olecranon Process  23.5 cm    Olecranon Process  22 cm    15 cm Proximal to Ulnar Styloid Process  21.5 cm    Just Proximal to Ulnar Styloid Process  14 cm    Across Hand at [employer]  17 cm    At [place] of 2nd Digit  5.5 cm        Quick Dash - 08/10/17 0001    Open a tight or new jar  Mild difficulty    Do heavy household chores (wash walls, wash floors)  Mild difficulty    Carry a shopping bag or briefcase  No difficulty    Wash your back  No difficulty    Use a knife to cut food  No difficulty    Recreational activities in which you take some force or impact through your arm, shoulder, or hand (golf, hammering, tennis)  Severe difficulty    During the past week, to what extent has your arm, shoulder or hand problem interfered with your normal social activities with family, friends, neighbors, or groups?  Slightly    During the past week, to what extent has your arm, shoulder or hand problem limited your work or other regular  daily activities  Not at all    Arm, shoulder, or hand pain.  Moderate    Tingling (pins and needles) in your arm, shoulder, or hand  Mild    Difficulty Sleeping  Mild difficulty    DASH Score  22.73 %       Objective measurements completed on examination: See above findings.                   PT Long Term Goals - 08/10/17 1801      PT LONG TERM GOAL #1   Title  Pt will report the pain in her left arm is decreased by 50% ( to 3/10) so that she can perform her daily activities easier     Baseline  6/10     Time  4    Period  Weeks    Status  New      PT LONG TERM GOAL #2   Title  Pt will be independent in a home exercise program for UE strength     Time  4    Period  Weeks    Status  New      PT LONG TERM GOAL #3   Title  Pt will be independent in lymphedema risk reduction practices and self MLD to left breast     Time  4    Period  Weeks    Status  New      PT LONG TERM GOAL #4   Title  Pt will report she is  independent in an exercise program for general fitness and increased strength     Time  4    Period  Weeks    Status  New      PT LONG TERM GOAL #5   Title  Pt will decrease Quick DASH score to < 10 indicating and improvment in function of left arm     Baseline  22.73    Time  4    Period  Weeks    Status  New             Plan - 08/10/17 1805    Clinical Impression Statement  56 yo female with historty of breast cancer with left lumpectomy with 3 axillary lymph nodes removed who deferred chemo and radiation who now presents with pain and muscle spasm interscapularly and right arm pain. She has decreased girth of left arm from reported disuse and wants to get her arm stronger.  She also has symptoms of fullness in left breast .  She wants to increase her general strength.. she will benefit form soft tissue work to her back, MLD to left breast and strengthening to her left upper quadrant and generally.     History and Personal Factors relevant to plan of care:  left breast cancer with 3 lymph nodes removed.     Clinical Presentation  Stable    Clinical Decision Making  Low    Rehab Potential  Good    Clinical Impairments Affecting Rehab Potential  generalized decreased strength     PT Frequency  2x / week    PT Duration  4 weeks    PT Treatment/Interventions  ADLs/Self Care Home Management;Scar mobilization;Dry needling;Manual techniques;Therapeutic activities;Patient/family education;Manual lymph drainage;Taping;Therapeutic exercise    PT Next Visit Plan  MLD to left breast and soft tissue work to left back. progress to supine scapular, rockwoods and strength ABC program  Give ABC class flyer and teach lymphedema risk reduction, if symptoms  persist, arrange for dry needling.     Consulted and Agree with Plan of Care  Patient       Patient will benefit from skilled therapeutic intervention in order to improve the following deficits and impairments:  Pain, Increased fascial  restricitons, Decreased knowledge of use of DME, Decreased scar mobility, Impaired UE functional use, Impaired perceived functional ability, Decreased strength, Increased edema  Visit Diagnosis: Pain of left arm - Plan: PT plan of care cert/re-cert  Lymphedema, not elsewhere classified - Plan: PT plan of care cert/re-cert  Generalized weakness - Plan: PT plan of care cert/re-cert     Problem List Patient Active Problem List   Diagnosis Date Noted  . Appendicitis 06/07/2017  . Globus sensation 09/23/2016  . Gastroesophageal reflux disease without esophagitis 09/23/2016  . Iron deficiency anemia 06/29/2016  . Iron deficiency anemia due to chronic blood loss 06/29/2016  . Malignant neoplasm of upper-outer quadrant of left breast in female, estrogen receptor positive ([place]) 07/14/2015  . Deficiency anemia 12/25/2014  . Barrett's esophagus 10/05/2012  . Gastritis 08/09/2012  . Constipation, chronic 08/09/2012  . Hyperlipidemia 08/01/2012  . Helicobacter pylori (H. pylori)    Meagan [name] PT  Meagan [name] 08/10/2017, [place], [place], [address] Phone: [phone]   Fax:  [phone]  Name: Meagan [name] MRN: [id number] Date of Birth: Feb 24, 1961

## 2017-08-11 ENCOUNTER — Ambulatory Visit: Payer: Commercial Managed Care - PPO | Admitting: Physical Therapy

## 2017-08-11 ENCOUNTER — Encounter: Payer: Self-pay | Admitting: Physical Therapy

## 2017-08-11 DIAGNOSIS — M79602 Pain in left arm: Secondary | ICD-10-CM

## 2017-08-11 DIAGNOSIS — I890 Lymphedema, not elsewhere classified: Secondary | ICD-10-CM

## 2017-08-11 DIAGNOSIS — R531 Weakness: Secondary | ICD-10-CM

## 2017-08-11 NOTE — Therapy (Signed)
[place], [place], [address] Phone: [phone]   Fax:  [phone]  Physical Therapy Treatment  Patient Details  Name: Meagan [name] MRN: [id number] Date of Birth: 06-Jul-1961 Referring Provider: Dr. Jana Hakim    Encounter Date: 08/11/2017  PT End of Session - 08/11/17 1245    Visit Number  2    Number of Visits  9    Date for PT Re-Evaluation  09/10/17    PT Start Time  0900 pt arrived late    PT Stop Time  0932    PT Time Calculation (min)  32 min    Activity Tolerance  Patient tolerated treatment well    Behavior During Therapy  [facility] for tasks assessed/performed       Past Medical History:  Diagnosis Date  . Arthritis of hand   . 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 Hilarie Fredrickson)  . Breast cancer, left ([place]) 06/26/2015   1 cm mass, + core bx; invasive ductal carcinoma: suggested tx plan was breast conservation surgery followed by HER-2 based therapy with accompanying chemotherapy, as well as radiation, followed by anti-estrogens.  However, as of 08/2015 f/u with Dr. Jana Hakim she has declined ALL chemo/med/radiation tx's and wants her port removed.  No sign of dz recurrence on onc f/u 06/2017.  . Endometrial polyp   . Endometriosis   . Fibroids   . GERD (gastroesophageal reflux disease)    +Barretts esophagus on endo 09/2012  . H/O hiatal hernia   . Helicobacter pylori (H. pylori)    positive serology 07/2012  . History of iron deficiency anemia    pt does not tolerate oral iron; plan for feraheme per oncologist as of 06/2016  . Hyperlipidemia   . Menorrhagia    has led to iron def anemia  . Rosacea   . SAB (spontaneous abortion)   . Shortness of breath    on exertion  . Sleep apnea    no CPAP    Past Surgical History:  Procedure Laterality Date  . BREAST BIOPSY  06/29/15   Core needle: + invasive mammary carcinoma  . BREAST LUMPECTOMY WITH RADIOACTIVE SEED AND  SENTINEL LYMPH NODE BIOPSY Left 07/28/2015   Path: invasive ductal carcinoma, neg margins, sentinal LN x 3 neg for tumor.  Procedure: LEFT BREAST LUMPECTOMY WITH RADIOACTIVE SEED AND SENTINEL LYMPH NODE BIOPSY;  Surgeon: Erroll Luna, MD;  Location: [facility];  Service: General;  Laterality: Left;  . D & C HYSTEROSCOPY/ POLYPECTOMY/ DX LAPAROSCOPY/ LYSIS ADHESIONS/ ABLATION ENDOMETRIOSIS  04-10-2009  . DILATATION & CURETTAGE/HYSTEROSCOPY WITH TRUECLEAR N/A 03/29/2013   Procedure: DILATATION & CURETTAGE/HYSTEROSCOPY WITH TRUECLEAR;  Surgeon: Marylynn Pearson, MD;  Location: [facility];  Service: Gynecology;  Laterality: N/A;  . DILATION AND CURETTAGE OF UTERUS    . ESOPHAGOGASTRODUODENOSCOPY  10/01/12; 06/2016   chronic inactive gastritis (H. pylori neg, no dysplasia or malig),+ Barretts esophagus (Dr. Hilarie Fredrickson).  2017 repeat showed Barrett's esoph, o/w normal.  Repeat 3 yrs recommended.  . EXP. LAP. LEFT OVARIAN CYSTECTOMY / LYSIS ADHESIONS/ MYOMECTOMY  2008  . HYSTEROSCOPY  2010  . HYSTEROSCOPY W/D&C  08/25/2011   Procedure: DILATATION AND CURETTAGE /HYSTEROSCOPY;  Surgeon: Marylynn Pearson;  Location: [facility];  Service: Gynecology;;  . LAPAROSCOPIC APPENDECTOMY N/A 06/07/2017   Procedure: APPENDECTOMY LAPAROSCOPIC;  Surgeon: Excell Seltzer, MD;  Location: [facility];  Service: General;  Laterality: N/A;  . MYOMECTOMY    . MYOMECTOMY ABDOMINAL APPROACH    .  POLYPECTOMY  2013   ENDOMETRIAL POLYP  . PORTACATH PLACEMENT Right 07/28/2015   Procedure: INSERTION PORT-A-CATH WITH ULTRA SOUND;  Surgeon: Erroll Luna, MD;  Location: [facility];  Service: General;  Laterality: Right;  . WISDOM TOOTH EXTRACTION      There were no vitals filed for this visit.  Subjective Assessment - 08/11/17 1239    Subjective  Pt reports she is doing okay this morning,  She has some pain in her back as she was curled up on the couch with her computer last night working on a  project.     Pertinent History  left breast cancer with lumpectomy in 2016 with lymph nodes removed.   No chemo or radiation .    Patient Stated Goals  to get rid of the pain     Currently in Pain?  Yes    Pain Score  -- did not rate             LYMPHEDEMA/ONCOLOGY QUESTIONNAIRE - 08/10/17 1155      Right Upper Extremity Lymphedema   10 cm Proximal to Olecranon Process  24.5 cm    Olecranon Process  23 cm    15 cm Proximal to Ulnar Styloid Process  22.6 cm    Just Proximal to Ulnar Styloid Process  14.5 cm    Across Hand at [employer]  17 cm    At [place] of 2nd Digit  5.3 cm      Left Upper Extremity Lymphedema   10 cm Proximal to Olecranon Process  23.5 cm    Olecranon Process  22 cm    15 cm Proximal to Ulnar Styloid Process  21.5 cm    Just Proximal to Ulnar Styloid Process  14 cm    Across Hand at [employer]  17 cm    At [place] of 2nd Digit  5.5 cm               [facility] Adult PT Treatment/Exercise - 08/11/17 0001      Lumbar Exercises: Supine   Ab Set  5 reps    Clam  5 reps    Heel Slides  5 reps    Bent Knee Raise  5 reps    Other Supine Lumbar Exercises  pelvic tilts      Manual Therapy   Soft tissue mobilization  in right sidelying, with biotone to left lumbar and thoracic paraspinals     Manual Lymphatic Drainage (MLD)  in supine, short neck, superficial and deep abdominals. right axillary nodes, anterior interaxillary anastamosis, left inguinal nodes and left axillo-inguinal anastamosis, left breast and chest, then to sidelying for posterior interaxillary anastamosis              PT Education - 08/11/17 1245    Education provided  Yes    Education Details  transverse abdominal series     Person(s) Educated  Patient    Methods  Explanation;Demonstration;Handout    Comprehension  Verbalized understanding;Returned demonstration          PT Long Term Goals - 08/10/17 1801      PT LONG TERM GOAL #1   Title  Pt will report the pain  in her left arm is decreased by 50% ( to 3/10) so that she can perform her daily activities easier     Baseline  6/10     Time  4    Period  Weeks    Status  New  PT LONG TERM GOAL #2   Title  Pt will be independent in a home exercise program for UE strength     Time  4    Period  Weeks    Status  New      PT LONG TERM GOAL #3   Title  Pt will be independent in lymphedema risk reduction practices and self MLD to left breast     Time  4    Period  Weeks    Status  New      PT LONG TERM GOAL #4   Title  Pt will report she is independent in an exercise program for general fitness and increased strength     Time  4    Period  Weeks    Status  New      PT LONG TERM GOAL #5   Title  Pt will decrease Quick DASH score to < 10 indicating and improvment in function of left arm     Baseline  22.73    Time  4    Period  Weeks    Status  New            Plan - 08/11/17 1246    Clinical Impression Statement  Pt tolerated treatment well and says she felt relief with soft tissue work. She was able to do beginning core execise     Clinical Impairments Affecting Rehab Potential  generalized decreased strength     PT Frequency  2x / week    PT Duration  4 weeks    PT Treatment/Interventions  ADLs/Self Care Home Management;Scar mobilization;Dry needling;Manual techniques;Therapeutic activities;Patient/family education;Manual lymph drainage;Taping;Therapeutic exercise    PT Next Visit Plan  continue MLD to left breast and soft tissue work to left back. discuss compression bras vs sports bras  progress to supine scapular, rockwoods and strength ABC program  Give ABC class flyer and teach lymphedema risk reduction, if symptoms persist, arrange for dry needling.        Patient will benefit from skilled therapeutic intervention in order to improve the following deficits and impairments:     Visit Diagnosis: Pain of left arm  Lymphedema, not elsewhere classified  Generalized  weakness     Problem List Patient Active Problem List   Diagnosis Date Noted  . Appendicitis 06/07/2017  . Globus sensation 09/23/2016  . Gastroesophageal reflux disease without esophagitis 09/23/2016  . Iron deficiency anemia 06/29/2016  . Iron deficiency anemia due to chronic blood loss 06/29/2016  . Malignant neoplasm of upper-outer quadrant of left breast in female, estrogen receptor positive ([place]) 07/14/2015  . Deficiency anemia 12/25/2014  . Barrett's esophagus 10/05/2012  . Gastritis 08/09/2012  . Constipation, chronic 08/09/2012  . Hyperlipidemia 08/01/2012  . Helicobacter pylori (H. pylori)    Donato Heinz. Owens Shark PT  [place] Levo 08/11/2017, 12:51 PM  [place], [place], [address] Phone: [phone]   Fax:  [phone]  Name: Meagan [name] MRN: [id number] Date of Birth: [birth year]

## 2017-08-11 NOTE — Patient Instructions (Signed)

## 2017-08-14 ENCOUNTER — Ambulatory Visit: Payer: Commercial Managed Care - PPO | Admitting: Physical Therapy

## 2017-08-30 ENCOUNTER — Other Ambulatory Visit: Payer: Self-pay | Admitting: *Deleted

## 2017-08-30 DIAGNOSIS — C50412 Malignant neoplasm of upper-outer quadrant of left female breast: Secondary | ICD-10-CM

## 2017-08-30 DIAGNOSIS — Z170 Estrogen receptor positive status [ER+]: Principal | ICD-10-CM

## 2017-09-04 ENCOUNTER — Encounter: Payer: Self-pay | Admitting: Physical Therapy

## 2017-09-04 ENCOUNTER — Ambulatory Visit: Payer: Commercial Managed Care - PPO | Attending: Oncology | Admitting: Physical Therapy

## 2017-09-04 ENCOUNTER — Ambulatory Visit: Payer: Commercial Managed Care - PPO | Admitting: Physical Therapy

## 2017-09-04 DIAGNOSIS — M79602 Pain in left arm: Secondary | ICD-10-CM | POA: Diagnosis present

## 2017-09-04 DIAGNOSIS — R531 Weakness: Secondary | ICD-10-CM | POA: Insufficient documentation

## 2017-09-04 DIAGNOSIS — I890 Lymphedema, not elsewhere classified: Secondary | ICD-10-CM | POA: Diagnosis present

## 2017-09-04 NOTE — Therapy (Addendum)
[facility] [address], [place], [place], [address] Phone: [phone]   Fax:  [phone]  Physical Therapy Treatment  Patient Details  Name: Meagan [name] MRN: [id number] Date of Birth: 1961/01/27 Referring Provider: Dr. Jana Hakim    Encounter Date: 09/04/2017  PT End of Session - 09/04/17 1115    Visit Number  3    Number of Visits  9    Date for PT Re-Evaluation  09/10/17    PT Start Time  1104    PT Stop Time  1147    PT Time Calculation (min)  43 min    Activity Tolerance  Patient tolerated treatment well    Behavior During Therapy  [facility] for tasks assessed/performed       Past Medical History:  Diagnosis Date  . Arthritis of hand   . 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 Hilarie Fredrickson)  . Breast cancer, left ([place]) 06/26/2015   1 cm mass, + core bx; invasive ductal carcinoma: suggested tx plan was breast conservation surgery followed by HER-2 based therapy with accompanying chemotherapy, as well as radiation, followed by anti-estrogens.  However, as of 08/2015 f/u with Dr. Jana Hakim she has declined ALL chemo/med/radiation tx's and wants her port removed.  No sign of dz recurrence on onc f/u 06/2017.  . Endometrial polyp   . Endometriosis   . Fibroids   . GERD (gastroesophageal reflux disease)    +Barretts esophagus on endo 09/2012  . H/O hiatal hernia   . Helicobacter pylori (H. pylori)    positive serology 07/2012  . History of iron deficiency anemia    pt does not tolerate oral iron; plan for feraheme per oncologist as of 06/2016  . Hyperlipidemia   . Menorrhagia    has led to iron def anemia  . Rosacea   . SAB (spontaneous abortion)   . Shortness of breath    on exertion  . Sleep apnea    no CPAP    Past Surgical History:  Procedure Laterality Date  . BREAST BIOPSY  06/29/15   Core needle: + invasive mammary carcinoma  . BREAST LUMPECTOMY WITH RADIOACTIVE SEED AND SENTINEL LYMPH  NODE BIOPSY Left 07/28/2015   Path: invasive ductal carcinoma, neg margins, sentinal LN x 3 neg for tumor.  Procedure: LEFT BREAST LUMPECTOMY WITH RADIOACTIVE SEED AND SENTINEL LYMPH NODE BIOPSY;  Surgeon: Erroll Luna, MD;  Location: [facility];  Service: General;  Laterality: Left;  . D & C HYSTEROSCOPY/ POLYPECTOMY/ DX LAPAROSCOPY/ LYSIS ADHESIONS/ ABLATION ENDOMETRIOSIS  04-10-2009  . DILATATION & CURETTAGE/HYSTEROSCOPY WITH TRUECLEAR N/A 03/29/2013   Procedure: DILATATION & CURETTAGE/HYSTEROSCOPY WITH TRUECLEAR;  Surgeon: Marylynn Pearson, MD;  Location: [facility];  Service: Gynecology;  Laterality: N/A;  . DILATION AND CURETTAGE OF UTERUS    . ESOPHAGOGASTRODUODENOSCOPY  10/01/12; 06/2016   chronic inactive gastritis (H. pylori neg, no dysplasia or malig),+ Barretts esophagus (Dr. Hilarie Fredrickson).  2017 repeat showed Barrett's esoph, o/w normal.  Repeat 3 yrs recommended.  . EXP. LAP. LEFT OVARIAN CYSTECTOMY / LYSIS ADHESIONS/ MYOMECTOMY  2008  . HYSTEROSCOPY  2010  . HYSTEROSCOPY W/D&C  08/25/2011   Procedure: DILATATION AND CURETTAGE /HYSTEROSCOPY;  Surgeon: Marylynn Pearson;  Location: [facility];  Service: Gynecology;;  . LAPAROSCOPIC APPENDECTOMY N/A 06/07/2017   Procedure: APPENDECTOMY LAPAROSCOPIC;  Surgeon: Excell Seltzer, MD;  Location: [facility];  Service: General;  Laterality: N/A;  . MYOMECTOMY    . MYOMECTOMY ABDOMINAL APPROACH    .  POLYPECTOMY  2013   ENDOMETRIAL POLYP  . PORTACATH PLACEMENT Right 07/28/2015   Procedure: INSERTION PORT-A-CATH WITH ULTRA SOUND;  Surgeon: Erroll Luna, MD;  Location: [facility];  Service: General;  Laterality: Right;  . WISDOM TOOTH EXTRACTION      There were no vitals filed for this visit.  Subjective Assessment - 09/04/17 1107    Subjective  Pt states she is having pain at both shoulders posterior shoulder joints.  She states she is having pain behind shoulder blades.    Pertinent History  left breast  cancer with lumpectomy in 2016 with lymph nodes removed.   No chemo or radiation .    Limitations  Sitting    How long can you sit comfortably?  one hour    Patient Stated Goals  to get rid of the pain    Currently in Pain?  Yes    Pain Score  5     Pain Location  Shoulder    Pain Orientation  Left;Right Lt>Rt    Pain Descriptors / Indicators  Spasm    Pain Type  Chronic pain    Pain Radiating Towards  left shoulder around shoulder blade and moves over the to right    Pain Onset  More than a month ago    Pain Frequency  Intermittent    Aggravating Factors   bending forward    Pain Relieving Factors  sitting up straight makes it feel less painful, tylenol or ibuprphen                      [facility] Adult PT Treatment/Exercise - 09/04/17 0001      Shoulder Exercises: ROM/Strengthening   Other ROM/Strengthening Exercises  upper trap stretch      Manual Therapy   Soft tissue mobilization  left rhomboids, thoracic multifidi, upper trap       Trigger Point Dry Needling - 09/04/17 1151    Consent Given?  Yes    Education Handout Provided  No verbally explained information; verbally communicated unders    Muscles Treated Upper Body  Rhomboids;Infraspinatus thoracic multifidi T2-4    Rhomboids Response  Twitch response elicited;Palpable increased muscle length    Infraspinatus Response  Twitch response elicited;Palpable increased muscle length           PT Education - 09/04/17 1356    Education provided  Yes    Education Details  upper trap stretch    Person(s) Educated  Patient    Methods  Explanation;Demonstration;Handout;Verbal cues    Comprehension  Verbalized understanding;Returned demonstration          PT Long Term Goals - 09/04/17 1147      PT LONG TERM GOAL #1   Title  Pt will report the pain in her left arm is decreased by 50% ( to 3/10) so that she can perform her daily activities easier     Baseline  6/10     Time  4    Period  Weeks    Status   On-going      PT LONG TERM GOAL #2   Title  Pt will be independent in a home exercise program for UE strength     Time  4    Period  Weeks    Status  On-going      PT LONG TERM GOAL #3   Title  Pt will be independent in lymphedema risk reduction practices and self MLD to left breast  Time  4    Period  Weeks    Status  Achieved      PT LONG TERM GOAL #4   Title  Pt will report she is independent in an exercise program for general fitness and increased strength     Time  4    Period  Weeks    Status  On-going      PT LONG TERM GOAL #5   Title  Pt will decrease Quick DASH score to < 10 indicating and improvment in function of left arm     Time  4    Period  Weeks    Status  On-going            Plan - 09/04/17 1357    Clinical Impression Statement  Pt tolerated treatment well.  She was educated in dry needling and what to expect.  Pt responded well to treatment and reported feeling better but still had some pain after treatment.  Pt was educated in upper trap stretch.  Pt will benefit from skilled PT to continue working on posture and improved soft tissue length.    PT Treatment/Interventions  ADLs/Self Care Home Management;Scar mobilization;Dry needling;Manual techniques;Therapeutic activities;Patient/family education;Manual lymph drainage;Taping;Therapeutic exercise    PT Next Visit Plan  dry needling to upper traps, suboccipitals, rhomboids, thoracic extension stretches and posture strengthening    Consulted and Agree with Plan of Care  Patient       Patient will benefit from skilled therapeutic intervention in order to improve the following deficits and impairments:  Pain, Increased fascial restricitons, Decreased knowledge of use of DME, Decreased scar mobility, Impaired UE functional use, Impaired perceived functional ability, Decreased strength, Increased edema  Visit Diagnosis: Pain of left arm  Lymphedema, not elsewhere classified  Generalized  weakness     Problem List Patient Active Problem List   Diagnosis Date Noted  . Appendicitis 06/07/2017  . Globus sensation 09/23/2016  . Gastroesophageal reflux disease without esophagitis 09/23/2016  . Iron deficiency anemia 06/29/2016  . Iron deficiency anemia due to chronic blood loss 06/29/2016  . Malignant neoplasm of upper-outer quadrant of left breast in female, estrogen receptor positive ([place]) 07/14/2015  . Deficiency anemia 12/25/2014  . Barrett's esophagus 10/05/2012  . Gastritis 08/09/2012  . Constipation, chronic 08/09/2012  . Hyperlipidemia 08/01/2012  . Helicobacter pylori (H. pylori)     Zannie Cove, PT 09/04/2017, 2:01 PM  [facility] [address], [place], [place], [address] Phone: [phone]   Fax:  [phone]  Name: Meagan [name] MRN: [id number] Date of Birth: 04-26-1961  PHYSICAL THERAPY DISCHARGE SUMMARY  Visits from Start of Care: 3  Current functional level related to goals / functional outcomes: See above   Remaining deficits: See above details   Education / Equipment: HEP  Plan: Patient agrees to discharge.  Patient goals were not met. Patient is being discharged due to not returning since the last visit.  ?????     [employer], PT 11/29/17 8:28 AM

## 2017-09-04 NOTE — Patient Instructions (Signed)
   UPPER TRAP STRETCH - HOLDING CHAIR  While sitting in a chair, hold the seat with one hand and bend your head towards the opposite side for a gentle stretch to the side of the neck. Hold 20 sec and repeat 3 x each side  [facility] [address] [place], [place] [address] Phone # [phone] Fax [phone]

## 2017-09-18 ENCOUNTER — Ambulatory Visit: Payer: Commercial Managed Care - PPO | Admitting: Physical Therapy

## 2017-09-18 DIAGNOSIS — L659 Nonscarring hair loss, unspecified: Secondary | ICD-10-CM | POA: Diagnosis not present

## 2017-09-18 DIAGNOSIS — L910 Hypertrophic scar: Secondary | ICD-10-CM | POA: Diagnosis not present

## 2017-09-18 DIAGNOSIS — L719 Rosacea, unspecified: Secondary | ICD-10-CM | POA: Diagnosis not present

## 2017-10-17 DIAGNOSIS — Z01419 Encounter for gynecological examination (general) (routine) without abnormal findings: Secondary | ICD-10-CM | POA: Diagnosis not present

## 2017-10-17 DIAGNOSIS — Z6822 Body mass index (BMI) 22.0-22.9, adult: Secondary | ICD-10-CM | POA: Diagnosis not present

## 2017-11-14 DIAGNOSIS — M545 Low back pain: Secondary | ICD-10-CM | POA: Diagnosis not present

## 2017-11-14 DIAGNOSIS — R102 Pelvic and perineal pain: Secondary | ICD-10-CM | POA: Diagnosis not present

## 2017-11-14 DIAGNOSIS — R140 Abdominal distension (gaseous): Secondary | ICD-10-CM | POA: Diagnosis not present

## 2017-12-26 ENCOUNTER — Other Ambulatory Visit: Payer: Self-pay | Admitting: *Deleted

## 2017-12-26 DIAGNOSIS — Z170 Estrogen receptor positive status [ER+]: Principal | ICD-10-CM

## 2017-12-26 DIAGNOSIS — C50412 Malignant neoplasm of upper-outer quadrant of left female breast: Secondary | ICD-10-CM

## 2017-12-26 NOTE — Progress Notes (Signed)
[place]  [phone] [phone]     ID: Meagan [name] DOB: Jan 16, 1961  MR#: [id number]  TKZ#:[id number]  Patient Care Team: Tammi Sou, MD as PCP - General (Family Medicine) Erroll Luna, MD as Consulting Physician (General Surgery) Magrinat, Virgie Dad, MD as Consulting Physician (Oncology) Sylvan Cheese, NP as Nurse Practitioner (Hematology and Oncology) Pyrtle, Lajuan Lines, MD as Consulting Physician (Gastroenterology) Excell Seltzer, MD as Consulting Physician (General Surgery) PCP: Tammi Sou, MD GYN: Marylynn Pearson MD OTHER MD: Thea Silversmith MD  CHIEF COMPLAINT:  Triple positive breast cancer  CURRENT TREATMENT: Observation   BREAST CANCER HISTORY: From the original intake note:  Meagan [name] had screening mammography in September 2015 showing left breast calcifications. She was recalled for diagnostic left mammogram 05/06/2014 at the breast Center. This found the breast density to be category C. Magnified views found the calcifications to be consistent with "milk of calcium". Accordingly she was set up for routine screening mammography a year later. However, sometime in July 2016 she noted a bump in her left breast. She eventually brought it to the attention of her primary care physician, who felt some areas of concern in the upper outer quadrants of both breasts.  Screening mammography 06/09/2015 was unremarkable. Bilateral diagnostic mammography with tomography at the breast Center 06/11/2015 found a 7 mm benign cyst in the right breast at the 10:00 position. In the left breast there was a hypoechoic oval mass at the 1:00 position 3 cm from the nipple measuring 10 mm. Biopsy of the left breast mass 06/26/2015 showed ([id number]) an invasive ductal carcinoma, E-cadherin positive, grade 2, estrogen receptor 80% positive, progesterone receptor 90% positive, both with strong staining intensity, with an MIB-1 of 5%, and HER-2  amplified, the signals ratio being 2.27 and the number per cell 5.45.  Bilateral breast MRI 07/06/2015 found a 1 cm mass in the upper outer left breast with central clip artifact. Laterally and inferior to this there was a TRAM track area of enhancement. This was consistent with benign biopsy tract changes. There were no abnormal appearing lymph nodes. There were 2 subcentimeter foci in the liver which were noted to be hepatic cysts in a prior abdominal CT  Her subsequent history is as detailed below.   INTERVAL HISTORY: Meagan [name] returns today for follow-up of her estrogen receptor positive breast cancer accompanied by her significant other, Eric. She continues under observation alone. She reports that she is doing well.  She has some itching on the right anterior chest from the port scar. She puts face cream on it.  It is not helping much   REVIEW OF SYSTEMS: Meagan [name] reports that she and her husband visited family in [place] and [place]. She continues to work. She also walks about an hour everyday for exercise. In the morning, she has a breakfast cereal bar and tea. She also has a fruit salad as a breakfast snack. For lunch, she eats a chicken salad and chicken broth. For lunch snack, she eats another bar and tea, and for dinner she has chicken broth. She has a dry cough because they visited family who smokes, which irritated her lungs. She also thinks this could be due to allergies and change of temperature from travelling.  She denies unusual headaches, visual changes, nausea, vomiting, or dizziness. There has been no unusual cough, phlegm production, or pleurisy. This been no change in bowel or bladder habits. She denies unexplained fatigue or unexplained weight loss, bleeding, rash, or  fever. A detailed review of systems was otherwise stable.    PAST MEDICAL HISTORY: Past Medical History:  Diagnosis Date  . Arthritis of hand   . 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 Hilarie Fredrickson)  . Breast  cancer, left ([place]) 06/26/2015   1 cm mass, + core bx; invasive ductal carcinoma: suggested tx plan was breast conservation surgery followed by HER-2 based therapy with accompanying chemotherapy, as well as radiation, followed by anti-estrogens.  However, as of 08/2015 f/u with Dr. Jana Hakim she has declined ALL chemo/med/radiation tx's and wants her port removed.  No sign of dz recurrence on onc f/u 06/2017.  . Endometrial polyp   . Endometriosis   . Fibroids   . GERD (gastroesophageal reflux disease)    +Barretts esophagus on endo 09/2012  . H/O hiatal hernia   . Helicobacter pylori (H. pylori)    positive serology 07/2012  . History of iron deficiency anemia    pt does not tolerate oral iron; plan for feraheme per oncologist as of 06/2016  . Hyperlipidemia   . Menorrhagia    has led to iron def anemia  . Rosacea   . SAB (spontaneous abortion)   . Shortness of breath    on exertion  . Sleep apnea    no CPAP    PAST SURGICAL HISTORY: Past Surgical History:  Procedure Laterality Date  . BREAST BIOPSY  06/29/15   Core needle: + invasive mammary carcinoma  . BREAST LUMPECTOMY WITH RADIOACTIVE SEED AND SENTINEL LYMPH NODE BIOPSY Left 07/28/2015   Path: invasive ductal carcinoma, neg margins, sentinal LN x 3 neg for tumor.  Procedure: LEFT BREAST LUMPECTOMY WITH RADIOACTIVE SEED AND SENTINEL LYMPH NODE BIOPSY;  Surgeon: Erroll Luna, MD;  Location: [facility];  Service: General;  Laterality: Left;  . D & C HYSTEROSCOPY/ POLYPECTOMY/ DX LAPAROSCOPY/ LYSIS ADHESIONS/ ABLATION ENDOMETRIOSIS  04-10-2009  . DILATATION & CURETTAGE/HYSTEROSCOPY WITH TRUECLEAR N/A 03/29/2013   Procedure: DILATATION & CURETTAGE/HYSTEROSCOPY WITH TRUECLEAR;  Surgeon: Marylynn Pearson, MD;  Location: [facility];  Service: Gynecology;  Laterality: N/A;  . DILATION AND CURETTAGE OF UTERUS    . ESOPHAGOGASTRODUODENOSCOPY  10/01/12; 06/2016   chronic inactive gastritis (H. pylori neg, no dysplasia or malig),+  Barretts esophagus (Dr. Hilarie Fredrickson).  2017 repeat showed Barrett's esoph, o/w normal.  Repeat 3 yrs recommended.  . EXP. LAP. LEFT OVARIAN CYSTECTOMY / LYSIS ADHESIONS/ MYOMECTOMY  2008  . HYSTEROSCOPY  2010  . HYSTEROSCOPY W/D&C  08/25/2011   Procedure: DILATATION AND CURETTAGE /HYSTEROSCOPY;  Surgeon: Marylynn Pearson;  Location: [facility];  Service: Gynecology;;  . LAPAROSCOPIC APPENDECTOMY N/A 06/07/2017   Procedure: APPENDECTOMY LAPAROSCOPIC;  Surgeon: Excell Seltzer, MD;  Location: [facility];  Service: General;  Laterality: N/A;  . MYOMECTOMY    . MYOMECTOMY ABDOMINAL APPROACH    . POLYPECTOMY  2013   ENDOMETRIAL POLYP  . PORTACATH PLACEMENT Right 07/28/2015   Procedure: INSERTION PORT-A-CATH WITH ULTRA SOUND;  Surgeon: Erroll Luna, MD;  Location: [facility];  Service: General;  Laterality: Right;  . WISDOM TOOTH EXTRACTION      FAMILY HISTORY Family History  Problem Relation Age of Onset  . Prostate cancer Father   . Hypertension Father   . Anuerysm Father   . Heart disease Father   . Heart disease Mother   . Fibroids Sister        uterine  . Uterine cancer Maternal Grandmother   . Heart disease Maternal Aunt   .  Heart disease Maternal Uncle   . Heart disease Paternal Uncle   . Stomach cancer Other        cousin  . Diabetes Other        half sister and brother  . Esophageal cancer Neg Hx   . Colon cancer Neg Hx    the patient's father died from a brain aneurysm at the age of 51. The patient's mother is living, age 52. The patient has 3 brothers, one sister. The patient's father was diagnosed with prostate cancer shortly before his death. On the mother'side, the grandmother may have had uterine cancer. The patient is not aware of any breast or ovarian cancers in the family  GYNECOLOGIC HISTORY:  No LMP recorded. Menarche age 42, the patient is G4 P0. She tells me she had 4 miscarriages felt to be due to fibroids. She had a myomectomy  procedure but then was unable to become pregnant. She still having periods. At this point however she feels she is "too old" to have children and if she became menopausal with treatment that would not be a problem  SOCIAL HISTORY:  Mikeisha works for [employer]. She is originally from [place], [place]. She is married but separated. Her significant other, Randall Hiss, is [place]. He also works for [employer].    ADVANCED DIRECTIVES: We discussed the fact that if the patient is not legally divorced, her husband, though separated, might claim to be her healthcare power of attorney. Accordingly I have given her the appropriate forms for her to complete and notarize at her discretion.   HEALTH MAINTENANCE: Social History   Tobacco Use  . Smoking status: Never Smoker  . Smokeless tobacco: Never Used  Substance Use Topics  . Alcohol use: No  . Drug use: No     Colonoscopy: 08/13/2013/Pyrtle  PAP: [facility]  Bone density:  Lipid panel:  No Known Allergies  Current Outpatient Medications  Medication Sig Dispense Refill  . acetaminophen (TYLENOL) 325 MG tablet Take 650 mg by mouth every 6 (six) hours as needed for mild pain or moderate pain.    Marland Kitchen FIBER SELECT GUMMIES CHEW Chew 2 capsules by mouth daily.    Marland Kitchen ibuprofen (ADVIL,MOTRIN) 200 MG tablet You can take 2-3 tablets every 6 hours as needed for pain, use this as your first line pain medicine.  You can alternate this with plain Tylenol also.  Use the prescribed pain medicine last.  You can buy this at any drug store over the counter. (Patient not taking: Reported on 08/10/2017)    . oxyCODONE (OXY IR/ROXICODONE) 5 MG immediate release tablet Take 1-2 tablets (5-10 mg total) by mouth every 4 (four) hours as needed for moderate pain. (Patient not taking: Reported on 08/10/2017) 15 tablet 0  . pantoprazole (PROTONIX) 40 MG tablet Take 1 tablet (40 mg total) by mouth daily after breakfast.    . pantoprazole (PROTONIX) 40 MG tablet TAKE 1  TABLET BY MOUTH TWICE A DAY 60 tablet 2  . polyethylene glycol powder (MIRALAX) powder Use daily as needed to correct constipation.  Follow package instructions.  If this continues to be a problem see your primary care physician for further evaluation and treatment.  You can buy this over-the-counter at any drugstore. (Patient not taking: Reported on 08/10/2017)     No current facility-administered medications for this visit.     OBJECTIVE: Middle-aged Latin American woman who appears well  Vitals:   12/27/17 1445  BP: 113/74  Pulse: 70  Resp:  19  Temp: 97.8 F (36.6 C)  SpO2: 100%     Body mass index is 22.11 kg/m.    ECOG FS:1 - Symptomatic but completely ambulatory  Sclerae unicteric, pupils round and equal Oropharynx clear and moist No cervical or supraclavicular adenopathy Lungs no rales or rhonchi; she kept a mild dry cough through the visit today Heart regular rate and rhythm Abd soft, nontender, positive bowel sounds MSK no focal spinal tenderness, no upper extremity lymphedema Neuro: nonfocal, well oriented, appropriate affect Breasts: The right breast is benign.  The left breast is status post lumpectomy.  There is no evidence of local recurrence.  Both axillae are benign.  LAB RESULTS:  CMP     Component Value Date/Time   NA 140 12/27/2017 1421   NA 138 06/22/2017 1553   K 3.9 12/27/2017 1421   K 3.9 06/22/2017 1553   CL 108 12/27/2017 1421   CO2 28 12/27/2017 1421   CO2 23 06/22/2017 1553   GLUCOSE 84 12/27/2017 1421   GLUCOSE 109 06/22/2017 1553   BUN 11 12/27/2017 1421   BUN 11.2 06/22/2017 1553   CREATININE 0.84 12/27/2017 1421   CREATININE 0.8 06/22/2017 1553   CALCIUM 9.5 12/27/2017 1421   CALCIUM 9.3 06/22/2017 1553   PROT 7.0 12/27/2017 1421   PROT 7.4 06/22/2017 1553   ALBUMIN 3.9 12/27/2017 1421   ALBUMIN 3.9 06/22/2017 1553   AST 14 12/27/2017 1421   AST 18 06/22/2017 1553   ALT 10 12/27/2017 1421   ALT 22 06/22/2017 1553   ALKPHOS 62  12/27/2017 1421   ALKPHOS 58 06/22/2017 1553   BILITOT 0.4 12/27/2017 1421   BILITOT 0.67 06/22/2017 1553   GFRNONAA >60 12/27/2017 1421   GFRAA >60 12/27/2017 1421    INo results found for: SPEP, UPEP  Lab Results  Component Value Date   WBC 6.6 12/27/2017   NEUTROABS 4.3 12/27/2017   HGB 11.2 (L) 12/27/2017   HCT 33.4 (L) 12/27/2017   MCV 91.9 12/27/2017   PLT 217 12/27/2017      Chemistry      Component Value Date/Time   NA 140 12/27/2017 1421   NA 138 06/22/2017 1553   K 3.9 12/27/2017 1421   K 3.9 06/22/2017 1553   CL 108 12/27/2017 1421   CO2 28 12/27/2017 1421   CO2 23 06/22/2017 1553   BUN 11 12/27/2017 1421   BUN 11.2 06/22/2017 1553   CREATININE 0.84 12/27/2017 1421   CREATININE 0.8 06/22/2017 1553      Component Value Date/Time   CALCIUM 9.5 12/27/2017 1421   CALCIUM 9.3 06/22/2017 1553   ALKPHOS 62 12/27/2017 1421   ALKPHOS 58 06/22/2017 1553   AST 14 12/27/2017 1421   AST 18 06/22/2017 1553   ALT 10 12/27/2017 1421   ALT 22 06/22/2017 1553   BILITOT 0.4 12/27/2017 1421   BILITOT 0.67 06/22/2017 1553       No results found for: LABCA2  No components found for: LABCA125  No results for input(s): INR in the last 168 hours.  Urinalysis    Component Value Date/Time   COLORURINE YELLOW 06/08/2017 [place] 06/08/2017 1152   LABSPEC 1.020 06/08/2017 1152   PHURINE 5.0 06/08/2017 1152   GLUCOSEU NEGATIVE 06/08/2017 1152   HGBUR SMALL (A) 06/08/2017 1152   BILIRUBINUR NEGATIVE 06/08/2017 1152   [place] 06/08/2017 1152   PROTEINUR NEGATIVE 06/08/2017 1152   UROBILINOGEN 0.2 05/02/2015 1455   NITRITE NEGATIVE 06/08/2017 1152  LEUKOCYTESUR SMALL (A) 06/08/2017 1152    STUDIES: Diagnostic mammography with tomography and left breast ultrasonography on 07/04/2017 at [facility] showed breast density category C. There was no evidence of malignancy in either breast. There was no mammographic or targeted  sonographic abnormality at the lumpectomy site to explain the patient's pain. No evidence of left axillary lymphadenopathy  ASSESSMENT: 57 y.o. [facility] woman status post left breast biopsy 06/26/2015 for a clinical T1b N0, stage IA invasive ductal carcinoma, grade 2, estrogen and progesterone receptor positive, HER-2 amplified, with a low MIB-1  (1) status post left lumpectomy and sentinel lymph node sampling 07/28/2015 for a  pT1b pN0, stage IA invasive ductal carcinoma, with negative margins.   (2) standard therapy (antiestrogens and  immunotherapy with chemotherapy followed by radiation and anti-estrogens)  refused by patient after extensive discussion 08/22/2015  (3) iron deficiency anemia secondary to menorrhagia, associated with fibroids   (a) status post Feraheme June 2018 (x2)  (b) received Feraheme again December 2018  PLAN: Margeaux is now 2-1/2 years out from definitive surgery for her breast cancer with no evidence of disease recurrence.  This is favorable.  As far as her iron deficiency anemia is concerned she is not taking iron supplementations because she cannot tolerate them.  She has tolerated Feraheme well.  We are going to check her counts in 6 months and 12 months and of course if her ferritin drops again we can always repeat the Feraheme.  This will likely need to continue until she is fully postmenopausal  She is very dissatisfied with her scars both in the left breast and the right anterior chest wall.  She would like these revised if possible.  We are referring her to plastics with that in mind  She will have her next set of mammograms November of this year.  Otherwise she will return to see me in a year.  She knows to call for any problems that may develop before the next visit.     Magrinat, Virgie Dad, MD  12/27/17 3:09 PM Medical Oncology and Hematology [facility] [address] [place], [place] [address] Tel. [phone]    Fax.  [phone]  This document serves as a record of services personally performed by Lurline Del, MD. It was created on his behalf by Sheron Nightingale, a trained medical scribe. The creation of this record is based on the scribe's personal observations and the provider's statements to them.   I have reviewed the above documentation for accuracy and completeness, and I agree with the above.

## 2017-12-27 ENCOUNTER — Inpatient Hospital Stay: Payer: Commercial Managed Care - PPO | Attending: Oncology | Admitting: Oncology

## 2017-12-27 ENCOUNTER — Inpatient Hospital Stay: Payer: Commercial Managed Care - PPO

## 2017-12-27 ENCOUNTER — Encounter: Payer: Self-pay | Admitting: Oncology

## 2017-12-27 VITALS — BP 113/74 | HR 70 | Temp 97.8°F | Resp 19 | Ht 63.0 in | Wt 124.8 lb

## 2017-12-27 DIAGNOSIS — C50412 Malignant neoplasm of upper-outer quadrant of left female breast: Secondary | ICD-10-CM

## 2017-12-27 DIAGNOSIS — Z923 Personal history of irradiation: Secondary | ICD-10-CM | POA: Diagnosis not present

## 2017-12-27 DIAGNOSIS — D500 Iron deficiency anemia secondary to blood loss (chronic): Secondary | ICD-10-CM

## 2017-12-27 DIAGNOSIS — D509 Iron deficiency anemia, unspecified: Secondary | ICD-10-CM | POA: Insufficient documentation

## 2017-12-27 DIAGNOSIS — Z9221 Personal history of antineoplastic chemotherapy: Secondary | ICD-10-CM | POA: Insufficient documentation

## 2017-12-27 DIAGNOSIS — Z170 Estrogen receptor positive status [ER+]: Principal | ICD-10-CM

## 2017-12-27 DIAGNOSIS — Z853 Personal history of malignant neoplasm of breast: Secondary | ICD-10-CM | POA: Insufficient documentation

## 2017-12-27 LAB — CMP (CANCER CENTER ONLY)
ALK PHOS: 62 U/L (ref 40–150)
ALT: 10 U/L (ref 0–55)
ANION GAP: 4 (ref 3–11)
AST: 14 U/L (ref 5–34)
Albumin: 3.9 g/dL (ref 3.5–5.0)
BILIRUBIN TOTAL: 0.4 mg/dL (ref 0.2–1.2)
BUN: 11 mg/dL (ref 7–26)
CALCIUM: 9.5 mg/dL (ref 8.4–10.4)
CO2: 28 mmol/L (ref 22–29)
CREATININE: 0.84 mg/dL (ref 0.60–1.10)
Chloride: 108 mmol/L (ref 98–109)
GFR, Est AFR Am: >60 mL/min (ref >60)
GFR, Estimated: >60 mL/min (ref >60)
GLUCOSE: 84 mg/dL (ref 70–140)
Potassium: 3.9 mmol/L (ref 3.5–5.1)
Sodium: 140 mmol/L (ref 136–145)
TOTAL PROTEIN: 7 g/dL (ref 6.4–8.3)

## 2017-12-27 LAB — CBC WITH DIFFERENTIAL (CANCER CENTER ONLY)
BASOS ABS: 0.1 10*3/uL (ref 0.0–0.1)
BASOS PCT: 1 %
EOS PCT: 6 %
Eosinophils Absolute: 0.4 10*3/uL (ref 0.0–0.5)
HCT: 33.4 % — ABNORMAL LOW (ref 34.8–46.6)
Hemoglobin: 11.2 g/dL — ABNORMAL LOW (ref 11.6–15.9)
LYMPHS PCT: 21 %
Lymphs Abs: 1.4 10*3/uL (ref 0.9–3.3)
MCH: 30.9 pg (ref 25.1–34.0)
MCHC: 33.6 g/dL (ref 31.5–36.0)
MCV: 91.9 fL (ref 79.5–101.0)
Monocytes Absolute: 0.4 10*3/uL (ref 0.1–0.9)
Monocytes Relative: 7 %
NEUTROS ABS: 4.3 10*3/uL (ref 1.5–6.5)
Neutrophils Relative %: 65 %
PLATELETS: 217 10*3/uL (ref 145–400)
RBC: 3.63 MIL/uL — AB (ref 3.70–5.45)
RDW: 12.7 % (ref 11.2–14.5)
WBC: 6.6 10*3/uL (ref 3.9–10.3)

## 2017-12-27 LAB — FERRITIN: Ferritin: 89 ng/mL (ref 9–269)

## 2017-12-29 ENCOUNTER — Telehealth: Payer: Self-pay | Admitting: Oncology

## 2017-12-29 NOTE — Telephone Encounter (Signed)
Left message re November 2019 amd May 2020 appointments. Referral to Dr. Harlow Mares (plastic surgery) sent via [facility].

## 2018-01-03 ENCOUNTER — Other Ambulatory Visit: Payer: Self-pay | Admitting: *Deleted

## 2018-01-04 ENCOUNTER — Telehealth: Payer: Self-pay

## 2018-01-04 NOTE — Telephone Encounter (Signed)
Informed pt her records were faxed yesterday for referral to Dr. Harlow Mares. She should hear from them soon. We also adjusted times to her next scheduled appointments.  Cyndia Bent RN

## 2018-01-05 ENCOUNTER — Telehealth: Payer: Self-pay | Admitting: Oncology

## 2018-01-05 NOTE — Telephone Encounter (Signed)
Faxed ROI to [facility] on 01/05/18, Release ID [id number]

## 2018-01-16 DIAGNOSIS — L659 Nonscarring hair loss, unspecified: Secondary | ICD-10-CM | POA: Diagnosis not present

## 2018-01-16 DIAGNOSIS — L910 Hypertrophic scar: Secondary | ICD-10-CM | POA: Diagnosis not present

## 2018-01-16 DIAGNOSIS — L719 Rosacea, unspecified: Secondary | ICD-10-CM | POA: Diagnosis not present

## 2018-01-23 ENCOUNTER — Encounter: Payer: Self-pay | Admitting: Family Medicine

## 2018-01-31 ENCOUNTER — Other Ambulatory Visit: Payer: Self-pay | Admitting: Internal Medicine

## 2018-02-12 DIAGNOSIS — L910 Hypertrophic scar: Secondary | ICD-10-CM | POA: Diagnosis not present

## 2018-02-20 DIAGNOSIS — Z630 Problems in relationship with spouse or partner: Secondary | ICD-10-CM | POA: Diagnosis not present

## 2018-02-22 DIAGNOSIS — Z630 Problems in relationship with spouse or partner: Secondary | ICD-10-CM | POA: Diagnosis not present

## 2018-03-01 DIAGNOSIS — Z630 Problems in relationship with spouse or partner: Secondary | ICD-10-CM | POA: Diagnosis not present

## 2018-03-15 DIAGNOSIS — Z630 Problems in relationship with spouse or partner: Secondary | ICD-10-CM | POA: Diagnosis not present

## 2018-05-08 ENCOUNTER — Other Ambulatory Visit: Payer: Self-pay | Admitting: *Deleted

## 2018-05-08 DIAGNOSIS — Z170 Estrogen receptor positive status [ER+]: Principal | ICD-10-CM

## 2018-05-08 DIAGNOSIS — C50412 Malignant neoplasm of upper-outer quadrant of left female breast: Secondary | ICD-10-CM

## 2018-05-11 ENCOUNTER — Telehealth: Payer: Self-pay | Admitting: Internal Medicine

## 2018-05-11 ENCOUNTER — Ambulatory Visit (HOSPITAL_COMMUNITY)
Admission: RE | Admit: 2018-05-11 | Discharge: 2018-05-11 | Disposition: A | Discharge status: Home / Self Care | Payer: Commercial Managed Care - PPO | Source: Ambulatory Visit | Attending: Adult Health | Admitting: Adult Health

## 2018-05-11 ENCOUNTER — Other Ambulatory Visit: Payer: Self-pay | Admitting: Adult Health

## 2018-05-11 ENCOUNTER — Other Ambulatory Visit: Payer: Self-pay

## 2018-05-11 ENCOUNTER — Telehealth: Payer: Self-pay

## 2018-05-11 DIAGNOSIS — C50412 Malignant neoplasm of upper-outer quadrant of left female breast: Secondary | ICD-10-CM

## 2018-05-11 DIAGNOSIS — Z853 Personal history of malignant neoplasm of breast: Secondary | ICD-10-CM

## 2018-05-11 DIAGNOSIS — M25552 Pain in left hip: Secondary | ICD-10-CM | POA: Diagnosis present

## 2018-05-11 DIAGNOSIS — D259 Leiomyoma of uterus, unspecified: Secondary | ICD-10-CM | POA: Insufficient documentation

## 2018-05-11 DIAGNOSIS — M25551 Pain in right hip: Secondary | ICD-10-CM | POA: Insufficient documentation

## 2018-05-11 DIAGNOSIS — Z170 Estrogen receptor positive status [ER+]: Principal | ICD-10-CM

## 2018-05-11 MED ORDER — PANTOPRAZOLE SODIUM 40 MG PO TBEC: 40.0000 mg | DELAYED_RELEASE_TABLET | Freq: Two times a day (BID) | ORAL | 1 refills | Status: AC

## 2018-05-11 NOTE — Telephone Encounter (Signed)
Patient has been scheduled for follow up with Dr Hilarie Fredrickson (it has been nearly 2 years since her last follow up). She has been given enough pantoprazole refills until that appointment.

## 2018-05-11 NOTE — Telephone Encounter (Signed)
Pt requesting rf for pantoprazole sent to [facility] in [place].

## 2018-05-11 NOTE — Telephone Encounter (Signed)
Pt called with c/o left breast pain and left hip pain that has been going on for about 2 months and getting worse. No redness or swelling breast and no nipple drainage. Leg pain is "where the top of the leg joins the hip".  No leg swelling. Pt advised that she needs a hip xray and a breast exam. Pt offered different appt times today and next Monday. Pt decided on Monday appt but advised to come in today for hip xray so results will be available on Monday. Pt verbalizes understanding of instructions.
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
## 2018-05-14 ENCOUNTER — Encounter: Payer: Self-pay | Admitting: Adult Health

## 2018-05-14 ENCOUNTER — Telehealth: Payer: Self-pay

## 2018-05-14 ENCOUNTER — Inpatient Hospital Stay: Payer: Commercial Managed Care - PPO | Attending: Adult Health | Admitting: Adult Health

## 2018-05-14 ENCOUNTER — Telehealth: Payer: Self-pay | Admitting: Adult Health

## 2018-05-14 ENCOUNTER — Inpatient Hospital Stay: Payer: Commercial Managed Care - PPO

## 2018-05-14 VITALS — BP 125/77 | HR 65 | Temp 97.7°F | Resp 20 | Ht 63.0 in | Wt 117.5 lb

## 2018-05-14 DIAGNOSIS — Z170 Estrogen receptor positive status [ER+]: Secondary | ICD-10-CM | POA: Insufficient documentation

## 2018-05-14 DIAGNOSIS — Z8049 Family history of malignant neoplasm of other genital organs: Secondary | ICD-10-CM

## 2018-05-14 DIAGNOSIS — C50412 Malignant neoplasm of upper-outer quadrant of left female breast: Secondary | ICD-10-CM | POA: Diagnosis not present

## 2018-05-14 DIAGNOSIS — M25551 Pain in right hip: Secondary | ICD-10-CM | POA: Diagnosis not present

## 2018-05-14 DIAGNOSIS — N644 Mastodynia: Secondary | ICD-10-CM | POA: Diagnosis not present

## 2018-05-14 DIAGNOSIS — D500 Iron deficiency anemia secondary to blood loss (chronic): Secondary | ICD-10-CM

## 2018-05-14 DIAGNOSIS — R350 Frequency of micturition: Secondary | ICD-10-CM | POA: Diagnosis not present

## 2018-05-14 DIAGNOSIS — R300 Dysuria: Secondary | ICD-10-CM

## 2018-05-14 DIAGNOSIS — Z800 Family history of malignant neoplasm of digestive organs: Secondary | ICD-10-CM | POA: Diagnosis not present

## 2018-05-14 DIAGNOSIS — Z8262 Family history of osteoporosis: Secondary | ICD-10-CM

## 2018-05-14 LAB — CBC WITH DIFFERENTIAL/PLATELET
Basophils Absolute: 0 10*3/uL (ref 0.0–0.1)
Basophils Relative: 1 %
EOS ABS: 0.1 10*3/uL (ref 0.0–0.5)
Eosinophils Relative: 2 %
HCT: 40.9 % (ref 34.8–46.6)
HEMOGLOBIN: 13.6 g/dL (ref 11.6–15.9)
Lymphocytes Relative: 15 %
Lymphs Abs: 0.9 10*3/uL (ref 0.9–3.3)
MCH: 30.2 pg (ref 25.1–34.0)
MCHC: 33.3 g/dL (ref 31.5–36.0)
MCV: 90.6 fL (ref 79.5–101.0)
MONO ABS: 0.3 10*3/uL (ref 0.1–0.9)
MONOS PCT: 6 %
NEUTROS PCT: 76 %
Neutro Abs: 4.5 10*3/uL (ref 1.5–6.5)
Platelets: 182 10*3/uL (ref 145–400)
RBC: 4.51 MIL/uL (ref 3.70–5.45)
RDW: 13.5 % (ref 11.2–14.5)
WBC: 5.9 10*3/uL (ref 3.9–10.3)

## 2018-05-14 LAB — URINALYSIS, COMPLETE (UACMP) WITH MICROSCOPIC
Bilirubin Urine: NEGATIVE
GLUCOSE, UA: NEGATIVE mg/dL
Hgb urine dipstick: NEGATIVE
Ketones, ur: NEGATIVE mg/dL
Leukocytes, UA: NEGATIVE
Nitrite: NEGATIVE
PH: 8 (ref 5.0–8.0)
Protein, ur: NEGATIVE mg/dL
SPECIFIC GRAVITY, URINE: 1.004 — AB (ref 1.005–1.030)

## 2018-05-14 LAB — COMPREHENSIVE METABOLIC PANEL
ALBUMIN: 4.5 g/dL (ref 3.5–5.0)
ALK PHOS: 68 U/L (ref 38–126)
ALT: 11 U/L (ref 0–44)
AST: 14 U/L — AB (ref 15–41)
Anion gap: 6 (ref 5–15)
BUN: 9 mg/dL (ref 6–20)
CALCIUM: 10.1 mg/dL (ref 8.9–10.3)
CHLORIDE: 105 mmol/L (ref 98–111)
CO2: 30 mmol/L (ref 22–32)
CREATININE: 0.81 mg/dL (ref 0.44–1.00)
GFR calc Af Amer: >60 mL/min (ref >60)
GFR calc non Af Amer: >60 mL/min (ref >60)
GLUCOSE: 86 mg/dL (ref 70–99)
Potassium: 4.5 mmol/L (ref 3.5–5.1)
SODIUM: 141 mmol/L (ref 135–145)
Total Bilirubin: 1 mg/dL (ref 0.3–1.2)
Total Protein: 7.7 g/dL (ref 6.5–8.1)

## 2018-05-14 LAB — FERRITIN: FERRITIN: 20 ng/mL (ref 11–307)

## 2018-05-14 NOTE — Telephone Encounter (Signed)
[facility] will call patient to schedule appts.  Otherwise, no new orders per 9/30 los.

## 2018-05-14 NOTE — Patient Instructions (Signed)
Take one Aleve, and one extra strength tylenol (500mg ), twice a day for your pain.  Your hip xray is normal.   Tests ordered: bone density, left breast mammogram, left breast ultrasound.  Urinalysis and urine culture.    I will have our social worker call you to get you connected with a good therapist in the community.

## 2018-05-14 NOTE — Telephone Encounter (Signed)
-----   Message from Gardenia Phlegm, NP sent at 05/14/2018 10:50 AM EDT ----- Urine normal ----- Message ----- From: Buel Ream, [facility] [place] Sent: 05/14/2018  10:48 AM EDT To: Gardenia Phlegm, NP

## 2018-05-14 NOTE — Telephone Encounter (Signed)
Spoke with pt informing of normal urinalysis results.  Pt voiced understanding with no questions at this time.

## 2018-05-15 ENCOUNTER — Encounter: Payer: Self-pay | Admitting: *Deleted

## 2018-05-15 LAB — URINE CULTURE: Culture: <10000 — AB

## 2018-05-15 NOTE — Progress Notes (Signed)
[place] Work  Hol[employer] representative received referral from APP for counseling/terapy options in the community.  CSW contacted patient at home to offer support and assess for needs.  Patient stated she was recently separated and going through the divorce process.  CSW discussed counseling options in the community, and patient expressed interest in referral information.  CSW sent patient a list of therapist and practices in the community.  Patient plans to review information and contact therapist/practice to schedule an appointment.  CSW encouraged patient to call with questions or concerns.     Johnnye Lana, MSW, LCSW, OSW-C Clinical Social Worker [facility] [phone]

## 2018-06-04 ENCOUNTER — Encounter: Payer: Self-pay | Admitting: Family Medicine

## 2018-06-27 ENCOUNTER — Encounter: Payer: Self-pay | Admitting: *Deleted

## 2018-07-03 ENCOUNTER — Ambulatory Visit: Payer: Commercial Managed Care - PPO | Admitting: Internal Medicine

## 2018-07-04 ENCOUNTER — Telehealth: Payer: Self-pay | Admitting: Internal Medicine

## 2018-07-04 NOTE — Telephone Encounter (Signed)
Advised patient that unfortunately, we are unable to continue to refill pantoprazole as she no showed her appointment yesterday and it has been 2 years since we have seen her. Patient advised that she can purchase ppi over the counter and take until being seen.

## 2018-07-11 ENCOUNTER — Inpatient Hospital Stay: Payer: Commercial Managed Care - PPO | Attending: Adult Health

## 2018-10-06 IMAGING — CR DG CHEST 2V
2 series · 2 of 2 positions shown · non-contrast
Comparison: 07/28/2015

CLINICAL DATA: History of breast cancer, left scapular pain

EXAM:
CHEST  2 VIEW

[w chest pa]
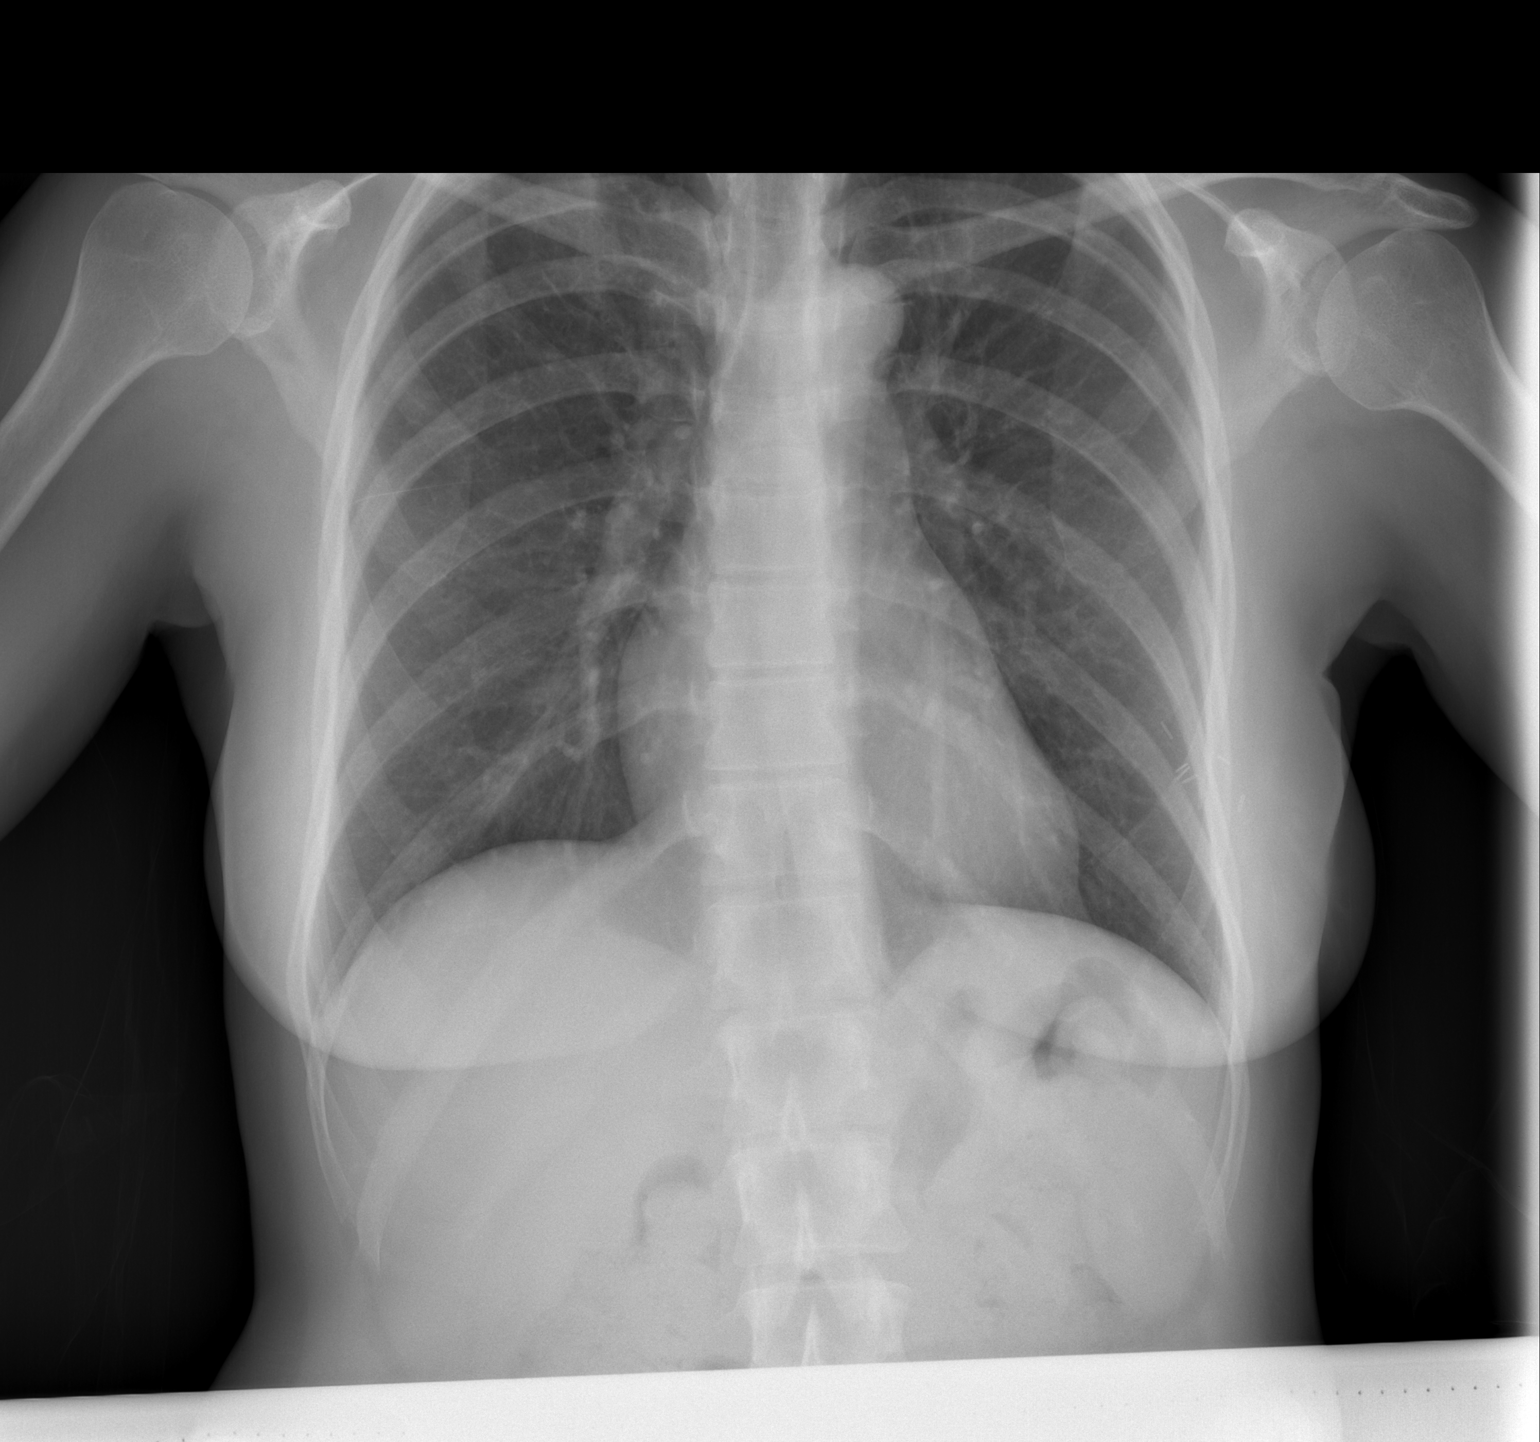

[w chest lat]
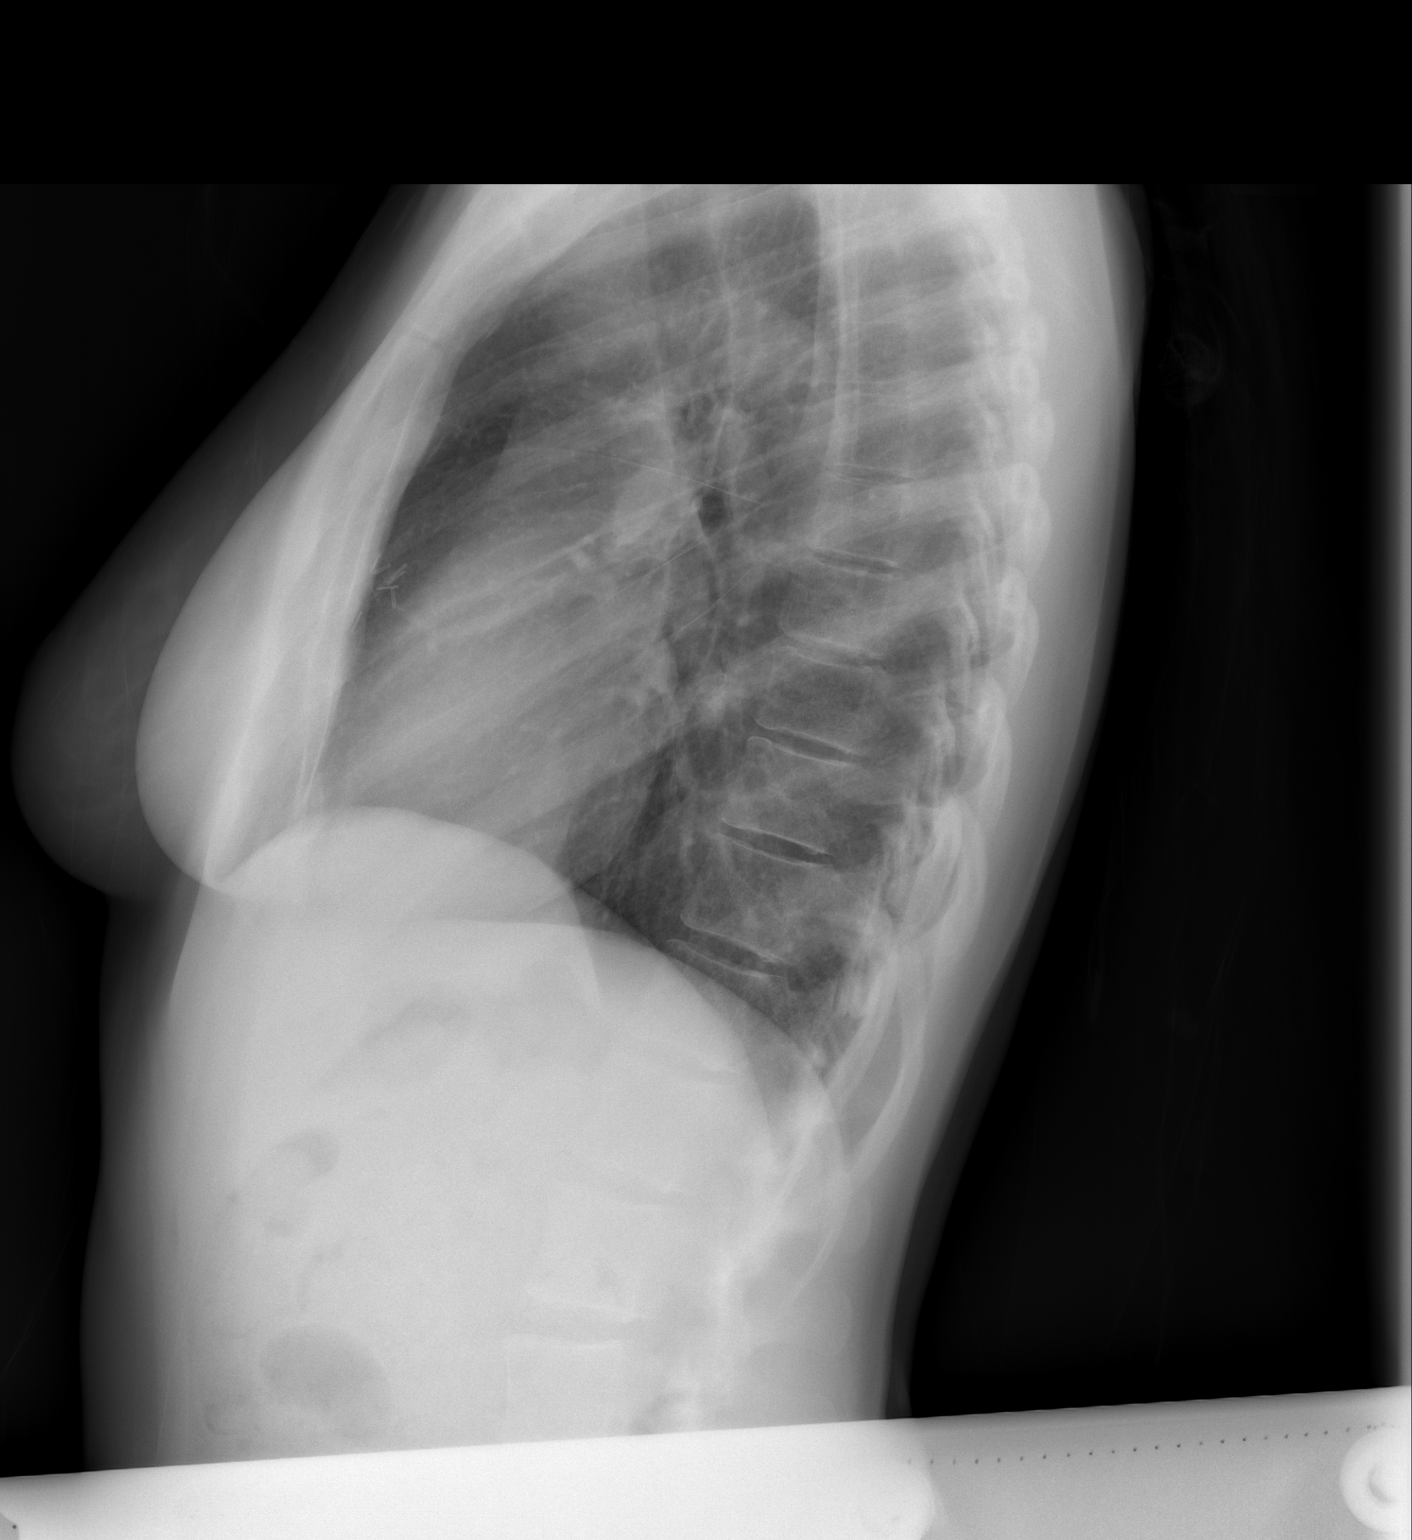

[2 of 2 positions shown; findings below may reference images not displayed]

FINDINGS: The heart size and mediastinal contours are within normal limits.
Both lungs are clear. The visualized skeletal structures are
unremarkable.
IMPRESSION: No active cardiopulmonary disease.

## 2018-12-21 ENCOUNTER — Telehealth: Payer: Self-pay | Admitting: Oncology

## 2018-12-21 NOTE — Telephone Encounter (Signed)
R/s appt per 5/08 sch message- pt is aware of change and time

## 2018-12-25 NOTE — Progress Notes (Signed)
[facility]  [phone] [phone]) [phone]     ID: Meagan [name] DOB: February 05, 1961  MR#: [id number]  WUX#:[id number]  Patient Care Team: System, Pcp Not In as PCP - General Meagan Luna, MD as Consulting Physician (General Surgery) Meagan [name], Meagan Dad, MD as Consulting Physician (Oncology) Meagan Cheese, NP as Nurse Practitioner (Hematology and Oncology) Meagan [name], Meagan Lines, MD as Consulting Physician (Gastroenterology) Meagan Seltzer, MD as Consulting Physician (General Surgery) Meagan Reese, MD as Consulting Physician (Plastic Surgery) PCP: System, Pcp Not In GYN: Meagan Pearson MD OTHER MD: Meagan Silversmith MD  CHIEF COMPLAINT:  Triple positive breast cancer  CURRENT TREATMENT: Observation   INTERVAL HISTORY: Meagan [name] returns today for follow-up of her triple positive breast cancer.    [name] tells me she had mammography she thinks in June or July 2019, but I do not find any record of that.  The last 1 I find is from November 2018.  Her last menstrual period was December 2019.  She has had no symptoms suggestive of recurrent anemia.  REVIEW OF SYSTEMS: [name] has had some shooting pains in her left breast which have greatly concerned her for cancer recurrence.  Is also had some left upper back discomfort.  She is exercising regularly, chiefly by walking, between 45 minutes and an hour 3 or 4 days a week.  She has had no unusual headaches, visual changes, nausea, vomiting, cough, phlegm production, pleurisy, shortness of breath, change in bowel or bladder habits, or any problems with fever or rash.  A detailed review of systems was otherwise stable  BREAST CANCER HISTORY: From the original intake note:  [name] had screening mammography in September 2015 showing left breast calcifications. She was recalled for diagnostic left mammogram 05/06/2014 at the breast Center. This found the breast density to be category C. Magnified views found the calcifications  to be consistent with "milk of calcium". Accordingly she was set up for routine screening mammography a year later. However, sometime in July 2016 she noted a bump in her left breast. She eventually brought it to the attention of her primary care physician, who felt some areas of concern in the upper outer quadrants of both breasts.  Screening mammography 06/09/2015 was unremarkable. Bilateral diagnostic mammography with tomography at the breast Center 06/11/2015 found a 7 mm benign cyst in the right breast at the 10:00 position. In the left breast there was a hypoechoic oval mass at the 1:00 position 3 cm from the nipple measuring 10 mm. Biopsy of the left breast mass 06/26/2015 showed ([id number]) an invasive ductal carcinoma, E-cadherin positive, grade 2, estrogen receptor 80% positive, progesterone receptor 90% positive, both with strong staining intensity, with an MIB-1 of 5%, and HER-2 amplified, the signals ratio being 2.27 and the number per cell 5.45.  Bilateral breast MRI 07/06/2015 found a 1 cm mass in the upper outer left breast with central clip artifact. Laterally and inferior to this there was a TRAM track area of enhancement. This was consistent with benign biopsy tract changes. There were no abnormal appearing lymph nodes. There were 2 subcentimeter foci in the liver which were noted to be hepatic cysts in a prior abdominal CT  Her subsequent history is as detailed below.    PAST MEDICAL HISTORY: Past Medical History:  Diagnosis Date  . Arthritis of hand   . 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 Hilarie [name])  . Breast cancer, left ([facility]) 06/26/2015   1 cm mass, + core  bx; invasive ductal carcinoma: suggested tx plan was breast conservation surgery followed by HER-2 based therapy with accompanying chemotherapy, as well as radiation, followed by anti-estrogens.  However, as of 08/2015 f/u with Dr. Jana [name] she has declined ALL chemo/med/radiation tx's and wants her port  removed.  No sign of dz recurrence on onc f/u  12/2017.   . Endometrial polyp   . Endometriosis   . Fibroids   . GERD (gastroesophageal reflux disease)    +Barretts esophagus on endo 09/2012  . Globus sensation   . H/O hiatal hernia   . Helicobacter pylori (H. pylori)    positive serology 07/2012  . History of iron deficiency anemia    pt does not tolerate oral iron; plan for feraheme per oncologist as of 06/2016  . Hyperlipidemia   . Menorrhagia    has led to iron def anemia.  Required feraheme on 2 occasions.  . Rosacea   . SAB (spontaneous abortion)   . Shortness of breath    on exertion  . Sleep apnea    no CPAP    PAST SURGICAL HISTORY: Past Surgical History:  Procedure Laterality Date  . BREAST BIOPSY  06/29/15   Core needle: + invasive mammary carcinoma  . BREAST LUMPECTOMY WITH RADIOACTIVE SEED AND SENTINEL LYMPH NODE BIOPSY Left 07/28/2015   Path: invasive ductal carcinoma, neg margins, sentinal LN x 3 neg for tumor.  Procedure: LEFT BREAST LUMPECTOMY WITH RADIOACTIVE SEED AND SENTINEL LYMPH NODE BIOPSY;  Surgeon: Meagan Luna, MD;  Location: [facility];  Service: General;  Laterality: Left;  . D & C HYSTEROSCOPY/ POLYPECTOMY/ DX LAPAROSCOPY/ LYSIS ADHESIONS/ ABLATION ENDOMETRIOSIS  04-10-2009  . DILATATION & CURETTAGE/HYSTEROSCOPY WITH TRUECLEAR N/A 03/29/2013   Procedure: DILATATION & CURETTAGE/HYSTEROSCOPY WITH TRUECLEAR;  Surgeon: Meagan Pearson, MD;  Location: [facility];  Service: Gynecology;  Laterality: N/A;  . DILATION AND CURETTAGE OF UTERUS    . ESOPHAGOGASTRODUODENOSCOPY  10/01/12; 06/2016   chronic inactive gastritis (H. pylori neg, no dysplasia or malig),+ Barretts esophagus (Dr. Hilarie [name]).  2017 repeat showed Barrett's esoph, o/w normal.  Repeat 3 yrs recommended.  . EXP. LAP. LEFT OVARIAN CYSTECTOMY / LYSIS ADHESIONS/ MYOMECTOMY  2008  . HYSTEROSCOPY  2010  . HYSTEROSCOPY W/D&C  08/25/2011   Procedure: DILATATION AND CURETTAGE /HYSTEROSCOPY;   Surgeon: Meagan [name];  Location: [facility];  Service: Gynecology;;  . LAPAROSCOPIC APPENDECTOMY N/A 06/07/2017   Procedure: APPENDECTOMY LAPAROSCOPIC;  Surgeon: Meagan Seltzer, MD;  Location: [facility];  Service: General;  Laterality: N/A;  . MYOMECTOMY    . MYOMECTOMY ABDOMINAL APPROACH    . POLYPECTOMY  2013   ENDOMETRIAL POLYP  . PORTACATH PLACEMENT Right 07/28/2015   Procedure: INSERTION PORT-A-CATH WITH ULTRA SOUND;  Surgeon: Meagan Luna, MD;  Location: [facility];  Service: General;  Laterality: Right;  . WISDOM TOOTH EXTRACTION      FAMILY HISTORY: Family History  Problem Relation Age of Onset  . Prostate cancer Father   . Hypertension Father   . Anuerysm Father   . Heart disease Father   . Heart disease Mother   . Fibroids Sister        uterine  . Uterine cancer Maternal Grandmother   . Heart disease Maternal Aunt   . Heart disease Maternal Uncle   . Heart disease Paternal Uncle   . Stomach cancer Other        cousin  . Diabetes Other        half sister and brother  .  Esophageal cancer Neg Hx   . Colon cancer Neg Hx    the patient's father died from a brain aneurysm at the age of 72. The patient's mother is living, age 94. The patient has 3 brothers, one sister. The patient's father was diagnosed with prostate cancer shortly before his death. On the mother'side, the grandmother may have had uterine cancer. The patient is not aware of any breast or ovarian cancers in the family  GYNECOLOGIC HISTORY:  No LMP recorded. Menarche age 29, the patient is G4 P0. She tells me she had 4 miscarriages felt to be due to fibroids. She had a myomectomy procedure after which she was unable to become pregnant.  Her last menstrual period was December 2019  SOCIAL HISTORY:  Khaniya works for 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 She is originally from [place], [place].    ADVANCED DIRECTIVES: We discussed the fact that if the patient  is not legally divorced, her husband, though separated, might claim to be her healthcare power of attorney. Accordingly I have given her the appropriate forms for her to complete and notarize at her discretion.--This was again reviewed on her 12/26/2018 visit   HEALTH MAINTENANCE: Social History   Tobacco Use  . Smoking status: Never Smoker  . Smokeless tobacco: Never Used  Substance Use Topics  . Alcohol use: No  . Drug use: No     Colonoscopy: 08/13/2013/Meagan [name]  PAP: [facility]  Bone density:  Lipid panel:  No Known Allergies  Current Outpatient Medications  Medication Sig Dispense Refill  . FIBER SELECT GUMMIES CHEW Chew 2 capsules by mouth daily.    . pantoprazole (PROTONIX) 40 MG tablet Take 1 tablet (40 mg total) by mouth 2 (two) times daily. MUST KEEP 06/2018 OFFICE VISIT FOR FURTHER REFILLS 60 tablet 1   No current facility-administered medications for this visit.     OBJECTIVE: Middle-aged Latin woman who appears younger than stated age 17:   12/26/18 1438  BP: 107/75  Pulse: 61  Resp: 18  Temp: 97.6 F (36.4 C)  SpO2: 100%     Body mass index is 21.01 kg/m.    ECOG FS:1 - Symptomatic but completely ambulatory  Sclerae unicteric, EOMs intact Masked No cervical or supraclavicular adenopathy Lungs no rales or rhonchi Heart regular rate and rhythm Abd soft, nontender, positive bowel sounds MSK no focal spinal tenderness, no upper extremity lymphedema Neuro: nonfocal, well oriented, appropriate affect Breasts: The right breast is unremarkable.  The left breast is status post lumpectomy.  There is no evidence of disease recurrence.  Both axillae are benign.  LAB RESULTS:  CMP     Component Value Date/Time   NA 140 12/26/2018 1421   NA 138 06/22/2017 1553   K 4.2 12/26/2018 1421   K 3.9 06/22/2017 1553   CL 103 12/26/2018 1421   CO2 30 12/26/2018 1421   CO2 23 06/22/2017 1553   GLUCOSE 91 12/26/2018 1421   GLUCOSE 109 06/22/2017 1553   BUN 14  12/26/2018 1421   BUN 11.2 06/22/2017 1553   CREATININE 0.82 12/26/2018 1421   CREATININE 0.84 12/27/2017 1421   CREATININE 0.8 06/22/2017 1553   CALCIUM 10.0 12/26/2018 1421   CALCIUM 9.3 06/22/2017 1553   PROT 7.3 12/26/2018 1421   PROT 7.4 06/22/2017 1553   ALBUMIN 3.9 12/26/2018 1421   ALBUMIN 3.9 06/22/2017 1553   AST 17 12/26/2018 1421   AST 14 12/27/2017 1421   AST 18 06/22/2017 1553   ALT  14 12/26/2018 1421   ALT 10 12/27/2017 1421   ALT 22 06/22/2017 1553   ALKPHOS 82 12/26/2018 1421   ALKPHOS 58 06/22/2017 1553   BILITOT 0.7 12/26/2018 1421   BILITOT 0.4 12/27/2017 1421   BILITOT 0.67 06/22/2017 1553   GFRNONAA >60 12/26/2018 1421   GFRNONAA >60 12/27/2017 1421   GFRAA >60 12/26/2018 1421   GFRAA >60 12/27/2017 1421    INo results found for: SPEP, UPEP  Lab Results  Component Value Date   WBC 4.4 12/26/2018   NEUTROABS 2.6 12/26/2018   HGB 12.6 12/26/2018   HCT 38.2 12/26/2018   MCV 91.0 12/26/2018   PLT 190 12/26/2018      Chemistry      Component Value Date/Time   NA 140 12/26/2018 1421   NA 138 06/22/2017 1553   K 4.2 12/26/2018 1421   K 3.9 06/22/2017 1553   CL 103 12/26/2018 1421   CO2 30 12/26/2018 1421   CO2 23 06/22/2017 1553   BUN 14 12/26/2018 1421   BUN 11.2 06/22/2017 1553   CREATININE 0.82 12/26/2018 1421   CREATININE 0.84 12/27/2017 1421   CREATININE 0.8 06/22/2017 1553      Component Value Date/Time   CALCIUM 10.0 12/26/2018 1421   CALCIUM 9.3 06/22/2017 1553   ALKPHOS 82 12/26/2018 1421   ALKPHOS 58 06/22/2017 1553   AST 17 12/26/2018 1421   AST 14 12/27/2017 1421   AST 18 06/22/2017 1553   ALT 14 12/26/2018 1421   ALT 10 12/27/2017 1421   ALT 22 06/22/2017 1553   BILITOT 0.7 12/26/2018 1421   BILITOT 0.4 12/27/2017 1421   BILITOT 0.67 06/22/2017 1553       No results found for: LABCA2  No components found for: LABCA125  No results for input(s): INR in the last 168 hours.  Urinalysis    Component Value  Date/Time   COLORURINE STRAW (A) 05/14/2018 1018   APPEARANCEUR CLEAR 05/14/2018 1018   LABSPEC 1.004 (L) 05/14/2018 1018   PHURINE 8.0 05/14/2018 1018   GLUCOSEU NEGATIVE 05/14/2018 1018   HGBUR NEGATIVE 05/14/2018 1018   BILIRUBINUR NEGATIVE 05/14/2018 1018   KETONESUR NEGATIVE 05/14/2018 1018   PROTEINUR NEGATIVE 05/14/2018 1018   UROBILINOGEN 0.2 05/02/2015 1455   NITRITE NEGATIVE 05/14/2018 1018   LEUKOCYTESUR NEGATIVE 05/14/2018 1018    STUDIES: No results found.   ASSESSMENT: 58 y.o. [facility] woman status post left breast biopsy 06/26/2015 for a clinical T1b N0, stage IA invasive ductal carcinoma, grade 2, estrogen and progesterone receptor positive, HER-2 amplified, with a low MIB-1  (1) status post left lumpectomy and sentinel lymph node sampling 07/28/2015 for a  pT1b pN0, stage IA invasive ductal carcinoma, with negative margins.   (2) standard therapy (antiestrogens and  immunotherapy with chemotherapy followed by radiation and anti-estrogens)  refused by patient after extensive discussion 08/22/2015  (3) iron deficiency anemia secondary to menorrhagia, associated with fibroids   (a) status post Feraheme June 2018 (x2)  (b) received Feraheme again December 2018  PLAN: Tyishia is now 3-1/2 years out from definitive surgery for breast cancer with no evidence of disease recurrence.  This is very favorable.  She has not accepted standard of care therapy and we are following her with observation alone.  To the best of my ability to tell she has not had a mammogram in nearly 2 years.  I have entered the order for her bilateral mammography at the breast center within the next month.  I reassured  her that the pain she is feeling in her left breast is very common, it is due to the treatment she received and is not indicative of breast cancer recurrence  Lab work is excellent and now that her periods have stopped very likely iron deficiency will not be a problem in the future   I encouraged her to go ahead and complete a healthcare power of attorney form and get it notarized.  I have made her a tentative appointment to see me again in a year.  She knows to call for any other issues that may develop before then.   Wilber Bihari, NP  12/26/18 3:20 PM Medical Oncology and Hematology [facility] [address] [place], [place] [address] Tel. [phone]    Fax. [phone]

## 2018-12-26 ENCOUNTER — Inpatient Hospital Stay: Payer: Commercial Managed Care - PPO | Attending: Oncology

## 2018-12-26 ENCOUNTER — Inpatient Hospital Stay (HOSPITAL_BASED_OUTPATIENT_CLINIC_OR_DEPARTMENT_OTHER): Payer: Commercial Managed Care - PPO | Admitting: Oncology

## 2018-12-26 ENCOUNTER — Other Ambulatory Visit: Payer: Self-pay

## 2018-12-26 VITALS — BP 107/75 | HR 61 | Temp 97.6°F | Resp 18 | Ht 63.0 in | Wt 118.6 lb

## 2018-12-26 DIAGNOSIS — Z800 Family history of malignant neoplasm of digestive organs: Secondary | ICD-10-CM

## 2018-12-26 DIAGNOSIS — D500 Iron deficiency anemia secondary to blood loss (chronic): Secondary | ICD-10-CM

## 2018-12-26 DIAGNOSIS — N644 Mastodynia: Secondary | ICD-10-CM

## 2018-12-26 DIAGNOSIS — Z8049 Family history of malignant neoplasm of other genital organs: Secondary | ICD-10-CM | POA: Diagnosis not present

## 2018-12-26 DIAGNOSIS — C50412 Malignant neoplasm of upper-outer quadrant of left female breast: Secondary | ICD-10-CM | POA: Insufficient documentation

## 2018-12-26 DIAGNOSIS — Z170 Estrogen receptor positive status [ER+]: Secondary | ICD-10-CM | POA: Insufficient documentation

## 2018-12-26 LAB — CBC WITH DIFFERENTIAL/PLATELET
Abs Immature Granulocytes: 0.01 10*3/uL (ref 0.00–0.07)
Basophils Absolute: 0 10*3/uL (ref 0.0–0.1)
Basophils Relative: 1 %
Eosinophils Absolute: 0.2 10*3/uL (ref 0.0–0.5)
Eosinophils Relative: 5 %
HCT: 38.2 % (ref 36.0–46.0)
Hemoglobin: 12.6 g/dL (ref 12.0–15.0)
Immature Granulocytes: 0 %
Lymphocytes Relative: 28 %
Lymphs Abs: 1.2 10*3/uL (ref 0.7–4.0)
MCH: 30 pg (ref 26.0–34.0)
MCHC: 33 g/dL (ref 30.0–36.0)
MCV: 91 fL (ref 80.0–100.0)
Monocytes Absolute: 0.4 10*3/uL (ref 0.1–1.0)
Monocytes Relative: 8 %
Neutro Abs: 2.6 10*3/uL (ref 1.7–7.7)
Neutrophils Relative %: 58 %
Platelets: 190 10*3/uL (ref 150–400)
RBC: 4.2 MIL/uL (ref 3.87–5.11)
RDW: 12.5 % (ref 11.5–15.5)
WBC: 4.4 10*3/uL (ref 4.0–10.5)
nRBC: 0 % (ref 0.0–0.2)

## 2018-12-26 LAB — COMPREHENSIVE METABOLIC PANEL
ALT: 14 U/L (ref 0–44)
AST: 17 U/L (ref 15–41)
Albumin: 3.9 g/dL (ref 3.5–5.0)
Alkaline Phosphatase: 82 U/L (ref 38–126)
Anion gap: 7 (ref 5–15)
BUN: 14 mg/dL (ref 6–20)
CO2: 30 mmol/L (ref 22–32)
Calcium: 10 mg/dL (ref 8.9–10.3)
Chloride: 103 mmol/L (ref 98–111)
Creatinine, Ser: 0.82 mg/dL (ref 0.44–1.00)
GFR calc Af Amer: >60 mL/min (ref >60)
GFR calc non Af Amer: >60 mL/min (ref >60)
Glucose, Bld: 91 mg/dL (ref 70–99)
Potassium: 4.2 mmol/L (ref 3.5–5.1)
Sodium: 140 mmol/L (ref 135–145)
Total Bilirubin: 0.7 mg/dL (ref 0.3–1.2)
Total Protein: 7.3 g/dL (ref 6.5–8.1)

## 2018-12-27 ENCOUNTER — Encounter: Payer: Self-pay | Admitting: Oncology

## 2018-12-27 ENCOUNTER — Other Ambulatory Visit: Payer: Self-pay | Admitting: Oncology

## 2018-12-27 LAB — FERRITIN: Ferritin: 22 ng/mL (ref 11–307)

## 2018-12-31 ENCOUNTER — Telehealth: Payer: Self-pay | Admitting: Oncology

## 2018-12-31 ENCOUNTER — Ambulatory Visit: Payer: Commercial Managed Care - PPO | Admitting: Oncology

## 2018-12-31 NOTE — Telephone Encounter (Signed)
Scheduled appts per sch msg.

## 2019-01-04 ENCOUNTER — Encounter: Payer: Self-pay | Admitting: Family Medicine

## 2019-04-01 ENCOUNTER — Inpatient Hospital Stay: Payer: Commercial Managed Care - PPO

## 2019-04-09 ENCOUNTER — Telehealth: Payer: Self-pay | Admitting: *Deleted

## 2019-04-09 NOTE — Telephone Encounter (Signed)
This RN received VM from pt stating she would like an appointment to see provider - due to " my left elbow and L arm hurts "  This RN returned call and obtained an identified VM- message left informing pt an appointment will be requested - if more urgent concerns needed to call again.

## 2019-04-10 ENCOUNTER — Telehealth: Payer: Self-pay | Admitting: Oncology

## 2019-04-10 NOTE — Telephone Encounter (Signed)
Scheduled appt per 8/25 sch message - unable to reach pt . Left message with appt date and time

## 2019-04-15 ENCOUNTER — Telehealth: Payer: Self-pay

## 2019-04-15 ENCOUNTER — Other Ambulatory Visit: Payer: Self-pay

## 2019-04-15 ENCOUNTER — Ambulatory Visit (HOSPITAL_COMMUNITY)
Admission: RE | Admit: 2019-04-15 | Discharge: 2019-04-15 | Disposition: A | Discharge status: Home / Self Care | Payer: Commercial Managed Care - PPO | Source: Ambulatory Visit | Attending: Adult Health | Admitting: Adult Health

## 2019-04-15 ENCOUNTER — Encounter: Payer: Self-pay | Admitting: Adult Health

## 2019-04-15 ENCOUNTER — Inpatient Hospital Stay: Payer: Commercial Managed Care - PPO | Attending: Adult Health | Admitting: Adult Health

## 2019-04-15 VITALS — BP 113/76 | HR 64 | Temp 97.8°F | Resp 18 | Ht 63.0 in | Wt 117.3 lb

## 2019-04-15 DIAGNOSIS — N920 Excessive and frequent menstruation with regular cycle: Secondary | ICD-10-CM | POA: Diagnosis not present

## 2019-04-15 DIAGNOSIS — Z170 Estrogen receptor positive status [ER+]: Secondary | ICD-10-CM | POA: Insufficient documentation

## 2019-04-15 DIAGNOSIS — D500 Iron deficiency anemia secondary to blood loss (chronic): Secondary | ICD-10-CM | POA: Insufficient documentation

## 2019-04-15 DIAGNOSIS — Z923 Personal history of irradiation: Secondary | ICD-10-CM | POA: Diagnosis not present

## 2019-04-15 DIAGNOSIS — Z9221 Personal history of antineoplastic chemotherapy: Secondary | ICD-10-CM | POA: Diagnosis not present

## 2019-04-15 DIAGNOSIS — C50412 Malignant neoplasm of upper-outer quadrant of left female breast: Secondary | ICD-10-CM | POA: Diagnosis not present

## 2019-04-15 DIAGNOSIS — Z853 Personal history of malignant neoplasm of breast: Secondary | ICD-10-CM | POA: Diagnosis not present

## 2019-04-15 NOTE — Telephone Encounter (Signed)
Spoke with patient to inform that no cancer seen on x-rays and per NP she should be seen by her PCP for a work up on her issues.  Patient voiced understanding and asked if results could be mailed to her.  Nurse will put those in the mail for patient.

## 2019-04-15 NOTE — Telephone Encounter (Signed)
-----   Message from Gardenia Phlegm, NP sent at 04/15/2019  2:56 PM EDT ----- No cancer on xrays.  She needs to see primary care for work up of her issues ----- Message ----- From: Interface, Rad Results In Sent: 04/15/2019   1:26 PM EDT To: Gardenia Phlegm, NP

## 2019-04-15 NOTE — Progress Notes (Signed)
[place]  [phone] [phone]     ID: Meagan [name] DOB: 11/20/1960  MR#: [id number]  LFY#:[id number]  Patient Care Team: System, Pcp Not In as PCP - General Erroll Luna, MD as Consulting Physician (General Surgery) Magrinat, Virgie Dad, MD as Consulting Physician (Oncology) Pyrtle, Lajuan Lines, MD as Consulting Physician (Gastroenterology) Crissie Reese, MD as Consulting Physician (Plastic Surgery) PCP: System, Pcp Not In GYN: Marylynn Pearson MD OTHER MD: Thea Silversmith MD  CHIEF COMPLAINT:  Triple positive breast cancer  CURRENT TREATMENT: Observation   INTERVAL HISTORY: Meagan [name] returns today for follow-up of her triple positive breast cancer.  She is on observation alone.  She notes that she has had increasing pain in her neck, shoulder, and left elbow.  The pain in her left elbow is constant.  This pain started 56 months ago.  She cannot recall a precipitating event.  No alleviating factors.  Touching the area makes it worse.  She hasn't had the pain evaluated, and hasn't taken anything to help the pain.  She notes pain in the joints of her fingers as well.    She has not undergone mammogram in over one year.  Her laast mammogram was on 07/04/2017 and showed no evidence of malignance and repeat mammogram was recommended for 06/2018.  She had breast density category C.   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  She exercises by walking daily for about 40 minutes.  She has a PCP, but isn't sure what their name is because she doesn't go frequently.  She says that she is up to date with colonoscopy, and she has f/u that is due with Dr. Hilarie Fredrickson for Barretts esophagus.  She hasn't yet called for an appointment.  She is up to date with pap smears, she is having these every 3 years.    Shey denies any fever, chills, usual headaches, vision changes.  She is without any bowel/bladder changes, nausea, vomiting, chest pain, palpitations,  cough, or shortness of breath.  A detailed ROS was otherwise non contributory.    BREAST CANCER HISTORY: From the original intake note:  Meagan [name] had screening mammography in September 2015 showing left breast calcifications. She was recalled for diagnostic left mammogram 05/06/2014 at the breast Center. This found the breast density to be category C. Magnified views found the calcifications to be consistent with "milk of calcium". Accordingly she was set up for routine screening mammography a year later. However, sometime in July 2016 she noted a bump in her left breast. She eventually brought it to the attention of her primary care physician, who felt some areas of concern in the upper outer quadrants of both breasts.  Screening mammography 06/09/2015 was unremarkable. Bilateral diagnostic mammography with tomography at the breast Center 06/11/2015 found a 7 mm benign cyst in the right breast at the 10:00 position. In the left breast there was a hypoechoic oval mass at the 1:00 position 3 cm from the nipple measuring 10 mm. Biopsy of the left breast mass 06/26/2015 showed ([id number]) an invasive ductal carcinoma, E-cadherin positive, grade 2, estrogen receptor 80% positive, progesterone receptor 90% positive, both with strong staining intensity, with an MIB-1 of 5%, and HER-2 amplified, the signals ratio being 2.27 and the number per cell 5.45.  Bilateral breast MRI 07/06/2015 found a 1 cm mass in the upper outer left breast with central clip artifact. Laterally and inferior to this there was a TRAM track area of enhancement. This  was consistent with benign biopsy tract changes. There were no abnormal appearing lymph nodes. There were 2 subcentimeter foci in the liver which were noted to be hepatic cysts in a prior abdominal CT  Her subsequent history is as detailed below.    PAST MEDICAL HISTORY: Past Medical History:  Diagnosis Date   Arthritis of hand    Barrett esophagus 06/2016   Needs  repeat EGD 06/2019 (Dr. Hilarie Fredrickson)   Breast cancer, left [facility]) 06/26/2015   Left lumpectomy with sentinal node sampling: suggested tx plan after surgery was HER-2 based therapy with accompanying chemotherapy, as well as radiation, followed by anti-estrogens.  However, as of 08/2015 and 12/2018 f/u with Dr. Jana Hakim she has declined ALL chemo/med/radiation tx's and wants her port removed.  No sign of dz recurrence on onc f/u 12/2017 and 12/2018.Marland Kitchen    Endometrial polyp    Endometriosis    Fibroids    GERD (gastroesophageal reflux disease)    +Barretts esophagus on endo 09/2012   Globus sensation    H/O hiatal hernia    Helicobacter pylori (H. pylori)    positive serology 07/2012   History of iron deficiency anemia    pt does not tolerate oral iron; plan for feraheme per oncologist as of 06/2016   Hyperlipidemia    Menorrhagia    has led to iron def anemia.  Required feraheme on 2 occasions.   Rosacea    SAB (spontaneous abortion)    Shortness of breath    on exertion   Sleep apnea    no CPAP    PAST SURGICAL HISTORY: Past Surgical History:  Procedure Laterality Date   BREAST BIOPSY  06/29/15   Core needle: + invasive mammary carcinoma   BREAST LUMPECTOMY WITH RADIOACTIVE SEED AND SENTINEL LYMPH NODE BIOPSY Left 07/28/2015   Path: invasive ductal carcinoma, neg margins, sentinal LN x 3 neg for tumor.  Procedure: LEFT BREAST LUMPECTOMY WITH RADIOACTIVE SEED AND SENTINEL LYMPH NODE BIOPSY;  Surgeon: Erroll Luna, MD;  Location: [facility];  Service: General;  Laterality: Left;   D & C HYSTEROSCOPY/ POLYPECTOMY/ DX LAPAROSCOPY/ LYSIS ADHESIONS/ ABLATION ENDOMETRIOSIS  04-10-2009   DILATATION & CURETTAGE/HYSTEROSCOPY WITH TRUECLEAR N/A 03/29/2013   Procedure: DILATATION & CURETTAGE/HYSTEROSCOPY WITH TRUECLEAR;  Surgeon: Marylynn Pearson, MD;  Location: [facility];  Service: Gynecology;  Laterality: N/A;   DILATION AND CURETTAGE OF UTERUS      ESOPHAGOGASTRODUODENOSCOPY  10/01/12; 06/2016   chronic inactive gastritis (H. pylori neg, no dysplasia or malig),+ Barretts esophagus (Dr. Hilarie Fredrickson).  2017 repeat showed Barrett's esoph, o/w normal.  Repeat 3 yrs recommended.   EXP. LAP. LEFT OVARIAN CYSTECTOMY / LYSIS ADHESIONS/ MYOMECTOMY  2008   HYSTEROSCOPY  2010   HYSTEROSCOPY W/D&C  08/25/2011   Procedure: DILATATION AND CURETTAGE /HYSTEROSCOPY;  Surgeon: Marylynn Pearson;  Location: [facility];  Service: Gynecology;;   LAPAROSCOPIC APPENDECTOMY N/A 06/07/2017   Procedure: APPENDECTOMY LAPAROSCOPIC;  Surgeon: Excell Seltzer, MD;  Location: [facility];  Service: General;  Laterality: N/A;   MYOMECTOMY     MYOMECTOMY ABDOMINAL APPROACH     POLYPECTOMY  2013   ENDOMETRIAL POLYP   PORTACATH PLACEMENT Right 07/28/2015   Procedure: INSERTION PORT-A-CATH WITH ULTRA SOUND;  Surgeon: Erroll Luna, MD;  Location: [place];  Service: General;  Laterality: Right;   WISDOM TOOTH EXTRACTION      FAMILY HISTORY: Family History  Problem Relation Age of Onset   Prostate cancer Father    Hypertension Father  Anuerysm Father    Heart disease Father    Heart disease Mother    Fibroids Sister        uterine   Uterine cancer Maternal Grandmother    Heart disease Maternal Aunt    Heart disease Maternal Uncle    Heart disease Paternal Uncle    Stomach cancer Other        cousin   Diabetes Other        half sister and brother   Esophageal cancer Neg Hx    Colon cancer Neg Hx    the patient's father died from a brain aneurysm at the age of 66. The patient's mother is living, age 31. The patient has 3 brothers, one sister. The patient's father was diagnosed with prostate cancer shortly before his death. On the mother'side, the grandmother may have had uterine cancer. The patient is not aware of any breast or ovarian cancers in the family  GYNECOLOGIC HISTORY:  No LMP recorded (lmp  unknown). Menarche age 67, the patient is G4 P0. She tells me she had 4 miscarriages felt to be due to fibroids. She had a myomectomy procedure after which she was unable to become pregnant.  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 She is originally from [place], [place].    ADVANCED DIRECTIVES: We discussed the fact that if the patient is not legally divorced, her husband, though separated, might claim to be her healthcare power of attorney. Accordingly I have given her the appropriate forms for her to complete and notarize at her discretion.--This was again reviewed on her 12/26/2018 visit   HEALTH MAINTENANCE: Social History   Tobacco Use   Smoking status: Never Smoker   Smokeless tobacco: Never Used  Substance Use Topics   Alcohol use: No   Drug use: No     Colonoscopy: 08/13/2013/Pyrtle  PAP: Julien Girt  Bone density:  Lipid panel:  No Known Allergies  Current Outpatient Medications  Medication Sig Dispense Refill   FIBER SELECT GUMMIES CHEW Chew 2 capsules by mouth daily.     pantoprazole (PROTONIX) 40 MG tablet Take 1 tablet (40 mg total) by mouth 2 (two) times daily. MUST KEEP 06/2018 OFFICE VISIT FOR FURTHER REFILLS 60 tablet 1   pantoprazole (PROTONIX) 20 MG tablet      venlafaxine XR (EFFEXOR-XR) 37.5 MG 24 hr capsule      No current facility-administered medications for this visit.     OBJECTIVE:  Vitals:   04/15/19 0943  BP: 113/76  Pulse: 64  Resp: 18  Temp: 97.8 F (36.6 C)  SpO2: 100%     Body mass index is 20.78 kg/m.    ECOG FS:1 - Symptomatic but completely ambulatory GENERAL: Patient is a well appearing female in no acute distress HEENT:  Sclerae anicteric.  Oropharynx clear and moist. No ulcerations or evidence of oropharyngeal candidiasis. Neck is supple.  NODES:  No cervical, supraclavicular, or axillary lymphadenopathy palpated.  BREAST EXAM:  Left  breast s/p lumpectomy, no sign of local recurrence, right breast benign LUNGS:  Clear to auscultation bilaterally.  No wheezes or rhonchi. HEART:  Regular rate and rhythm. No murmur appreciated. ABDOMEN:  Soft, nontender.  Positive, normoactive bowel sounds. No organomegaly palpated. MSK:  Left medial humeral head +TTP, no TTP in shoulder or cervical spine, no ROM difficulty, no TTP over epicondyles bilaterally EXTREMITIES:  No peripheral edema.   SKIN:  Clear  with no obvious rashes or skin changes. No nail dyscrasia. NEURO:  Nonfocal. Well oriented.  Appropriate affect.    LAB RESULTS:  CMP     Component Value Date/Time   NA 140 12/26/2018 1421   NA 138 06/22/2017 1553   K 4.2 12/26/2018 1421   K 3.9 06/22/2017 1553   CL 103 12/26/2018 1421   CO2 30 12/26/2018 1421   CO2 23 06/22/2017 1553   GLUCOSE 91 12/26/2018 1421   GLUCOSE 109 06/22/2017 1553   BUN 14 12/26/2018 1421   BUN 11.2 06/22/2017 1553   CREATININE 0.82 12/26/2018 1421   CREATININE 0.84 12/27/2017 1421   CREATININE 0.8 06/22/2017 1553   CALCIUM 10.0 12/26/2018 1421   CALCIUM 9.3 06/22/2017 1553   PROT 7.3 12/26/2018 1421   PROT 7.4 06/22/2017 1553   ALBUMIN 3.9 12/26/2018 1421   ALBUMIN 3.9 06/22/2017 1553   AST 17 12/26/2018 1421   AST 14 12/27/2017 1421   AST 18 06/22/2017 1553   ALT 14 12/26/2018 1421   ALT 10 12/27/2017 1421   ALT 22 06/22/2017 1553   ALKPHOS 82 12/26/2018 1421   ALKPHOS 58 06/22/2017 1553   BILITOT 0.7 12/26/2018 1421   BILITOT 0.4 12/27/2017 1421   BILITOT 0.67 06/22/2017 1553   GFRNONAA >60 12/26/2018 1421   GFRNONAA >60 12/27/2017 1421   GFRAA >60 12/26/2018 1421   GFRAA >60 12/27/2017 1421    INo results found for: SPEP, UPEP  Lab Results  Component Value Date   WBC 4.4 12/26/2018   NEUTROABS 2.6 12/26/2018   HGB 12.6 12/26/2018   HCT 38.2 12/26/2018   MCV 91.0 12/26/2018   PLT 190 12/26/2018      Chemistry      Component Value Date/Time   NA 140 12/26/2018  1421   NA 138 06/22/2017 1553   K 4.2 12/26/2018 1421   K 3.9 06/22/2017 1553   CL 103 12/26/2018 1421   CO2 30 12/26/2018 1421   CO2 23 06/22/2017 1553   BUN 14 12/26/2018 1421   BUN 11.2 06/22/2017 1553   CREATININE 0.82 12/26/2018 1421   CREATININE 0.84 12/27/2017 1421   CREATININE 0.8 06/22/2017 1553      Component Value Date/Time   CALCIUM 10.0 12/26/2018 1421   CALCIUM 9.3 06/22/2017 1553   ALKPHOS 82 12/26/2018 1421   ALKPHOS 58 06/22/2017 1553   AST 17 12/26/2018 1421   AST 14 12/27/2017 1421   AST 18 06/22/2017 1553   ALT 14 12/26/2018 1421   ALT 10 12/27/2017 1421   ALT 22 06/22/2017 1553   BILITOT 0.7 12/26/2018 1421   BILITOT 0.4 12/27/2017 1421   BILITOT 0.67 06/22/2017 1553       No results found for: LABCA2  No components found for: LABCA125  No results for input(s): INR in the last 168 hours.  Urinalysis    Component Value Date/Time   COLORURINE STRAW (A) 05/14/2018 1018   APPEARANCEUR CLEAR 05/14/2018 1018   LABSPEC 1.004 (L) 05/14/2018 1018   PHURINE 8.0 05/14/2018 1018   GLUCOSEU NEGATIVE 05/14/2018 1018   HGBUR NEGATIVE 05/14/2018 1018   BILIRUBINUR NEGATIVE 05/14/2018 1018   KETONESUR NEGATIVE 05/14/2018 1018   PROTEINUR NEGATIVE 05/14/2018 1018   UROBILINOGEN 0.2 05/02/2015 1455   NITRITE NEGATIVE 05/14/2018 1018   LEUKOCYTESUR NEGATIVE 05/14/2018 1018    STUDIES: No results found.   ASSESSMENT: 58 y.o. [facility] woman status post left breast biopsy 06/26/2015 for a clinical T1b N0, stage IA invasive  ductal carcinoma, grade 2, estrogen and progesterone receptor positive, HER-2 amplified, with a low MIB-1  (1) status post left lumpectomy and sentinel lymph node sampling 07/28/2015 for a  pT1b pN0, stage IA invasive ductal carcinoma, with negative margins.   (2) standard therapy (antiestrogens and  immunotherapy with chemotherapy followed by radiation and anti-estrogens)  refused by patient after extensive discussion 08/22/2015  (3)  iron deficiency anemia secondary to menorrhagia, associated with fibroids   (a) status post Feraheme June 2018 (x2)  (b) received Feraheme again December 2018  PLAN: Rashawn has no clinical signs of breast cancer recurrence.  She did not have a mammogram last year, despite it being scheduled, and her knowing that it needed to be done.  I placed orders for another mammogram today, and my nurse Cecille Rubin called the breast center, set it up, and gave her the information of the date/time/and phone number so that hopefully there are no further barriers into getting this done.    Janelys and I spent a good deal of time reviewing her issues with her arthralgias.  We will get xrays to evaluate.  I recommended that she get in with PCP to have a full panel of labs.  This likely is not cancer, and could certainly be an autoimmune issue, or osteoarthritis.  We will f/u with her on the xray results, and go from there.    Zakaria and I reviewed her health maintenance, healthy diet/exercise, which she is doing good in regards to diet and exercise.  I encouraged her to f/u with GI due to her h/o Barretts esophagus and f/u being due.    Anadelia will return in 1 year for labs and f/u.  She was recommended to continue with the appropriate pandemic precautions. She knows to call for any questions that may arise between now and her next appointment.  We are happy to see her sooner if needed.  A total of (30) minutes of face-to-face time was spent with this patient with greater than 50% of that time in counseling and care-coordination.   Wilber Bihari, NP  04/15/19 11:23 AM Medical Oncology and Hematology [facility] [address] [place], [place] [address] Tel. [phone]    Fax. [phone]

## 2019-04-16 ENCOUNTER — Telehealth: Payer: Self-pay | Admitting: Oncology

## 2019-04-16 NOTE — Telephone Encounter (Signed)
I talk with patient regarding schedule  

## 2019-04-26 ENCOUNTER — Telehealth: Payer: Self-pay | Admitting: *Deleted

## 2019-04-26 NOTE — Telephone Encounter (Signed)
This RN spoke with pt per her VM stating " I got a call that I have an appointment on Monday and then in Nov and Feb but I do not know what for "  This RN reviewed appointments were requested per MD due to history of iron deficeit anemia and need to monitor for possible further replacement.  Derinda stated she does not want to check these labs " and when I was at my last appointment ( 8/31 with [facility]/NP) I was told I wouldn't need to come in until next year so unless it is a lab that checks to see if the cancer has gone to another part of my body- I do not want to come "  This RN verified pt wants to cancel all 3 lab only appointments- informed pt of scheduled lab and MD for May 2021.  No other needs at this time.

## 2019-04-29 ENCOUNTER — Inpatient Hospital Stay: Payer: Commercial Managed Care - PPO

## 2019-07-01 ENCOUNTER — Other Ambulatory Visit: Payer: Commercial Managed Care - PPO

## 2019-08-01 ENCOUNTER — Encounter: Payer: Self-pay | Admitting: Internal Medicine

## 2019-08-13 ENCOUNTER — Ambulatory Visit: Payer: Commercial Managed Care - PPO | Admitting: Allergy and Immunology

## 2019-08-13 ENCOUNTER — Encounter: Payer: Self-pay | Admitting: Allergy and Immunology

## 2019-08-13 ENCOUNTER — Other Ambulatory Visit: Payer: Self-pay

## 2019-08-13 VITALS — BP 100/62 | HR 65 | Temp 96.1°F | Resp 18 | Ht 62.5 in | Wt 119.4 lb

## 2019-08-13 DIAGNOSIS — T7840XA Allergy, unspecified, initial encounter: Secondary | ICD-10-CM

## 2019-08-13 DIAGNOSIS — K9049 Malabsorption due to intolerance, not elsewhere classified: Secondary | ICD-10-CM | POA: Insufficient documentation

## 2019-08-13 DIAGNOSIS — L719 Rosacea, unspecified: Secondary | ICD-10-CM | POA: Diagnosis not present

## 2019-08-13 DIAGNOSIS — L309 Dermatitis, unspecified: Secondary | ICD-10-CM | POA: Diagnosis not present

## 2019-08-13 NOTE — Assessment & Plan Note (Signed)
Uncertain etiology.  This does not appear to be urticaria, atopic dermatitis, or allergic contact dermatitis.  Skin tests to select food allergens were negative today. The negative predictive value of food allergen skin testing is excellent (approximately 95%). While this does not appear to be an IgE mediated issue, skin testing does not rule out food intolerances. These etiologies are suggested when elimination of the responsible food leads to symptom resolution and re-introduction of the food is followed by the return of symptoms.   The patient has been encouraged to keep a careful symptom/food journal and eliminate any food suspected of correlating with symptoms. Should symptoms concerning for anaphylaxis arise, 911 is to be called immediately.  An information sheet regarding salicylate containing foods has been provided and discussed.   If cutaneous symptoms recur, persist, or progress, further dermatology evaluation may be warranted.

## 2019-08-13 NOTE — Patient Instructions (Signed)
Rash/dermatitis Uncertain etiology.  This does not appear to be urticaria, atopic dermatitis, or allergic contact dermatitis.  Skin tests to select food allergens were negative today. The negative predictive value of food allergen skin testing is excellent (approximately 95%). While this does not appear to be an IgE mediated issue, skin testing does not rule out food intolerances. These etiologies are suggested when elimination of the responsible food leads to symptom resolution and re-introduction of the food is followed by the return of symptoms.   The patient has been encouraged to keep a careful symptom/food journal and eliminate any food suspected of correlating with symptoms. Should symptoms concerning for anaphylaxis arise, 911 is to be called immediately.  An information sheet regarding salicylate containing foods has been provided and discussed.   If cutaneous symptoms recur, persist, or progress, further dermatology evaluation may be warranted.

## 2019-08-13 NOTE — Progress Notes (Signed)
New Patient Note  RE: Meagan [name] MRN: [id number] DOB: 1961-02-28 Date of Office Visit: 08/13/2019  Referring provider: No ref. provider found Primary care provider: Patient, No Pcp Per  Chief Complaint: Rash and Allergic Reaction  History of present illness: Meagan [name] is a 58 y.o. female presenting today for evaluation of rash.  She reports that approximately 2 weeks ago she developed a rash on her cheeks.  The rash is described as "look like pimples" and pruritic.  She has had rosacea over the past 7 years causing her cheeks to become flushed and hot.  Within the 24 hours prior to the onset of the rash 2 weeks ago she had consumed dark chocolate/hot chocolate and [place] food.  She also questions if the rash was a result of consuming acidic foods, including berries and oranges. She also questions if changes in hormones related to menopause may be contributing to the rash. She was initially prescribed oral ivermectin with symptom reduction, however 3 days later the rash returned.  At that time, she was prescribed topical metronidazole, however this medication burned so was discontinued.  Then she was started on ivermectin 1% cream and minocycline 1.5% foam with benefit.  In addition, she called her physician from [place] to prescribe amoxicillin.  Her dermatologist believes that this may be a form of rosacea, however the dermatologist is not certain of the etiology.   Assessment and plan: Rash/dermatitis Uncertain etiology.  This does not appear to be urticaria, atopic dermatitis, or allergic contact dermatitis.  Skin tests to select food allergens were negative today. The negative predictive value of food allergen skin testing is excellent (approximately 95%). While this does not appear to be an IgE mediated issue, skin testing does not rule out food intolerances. These etiologies are suggested when elimination of the responsible food leads to symptom resolution and re-introduction of the food  is followed by the return of symptoms.   The patient has been encouraged to keep a careful symptom/food journal and eliminate any food suspected of correlating with symptoms. Should symptoms concerning for anaphylaxis arise, 911 is to be called immediately.  An information sheet regarding salicylate containing foods has been provided and discussed.   If cutaneous symptoms recur, persist, or progress, further dermatology evaluation may be warranted.   Diagnostics: Food allergen skin testing: Negative despite a positive histamine control.    Physical examination: Blood pressure 100/62, pulse 65, temperature (!) 96.1 F (35.6 C), temperature source Skin, resp. rate 18, height 5' 2.5" (1.588 m), weight 119 lb 6.4 oz (54.2 kg), SpO2 99 %.  General: Alert, interactive, in no acute distress. Neck: Supple without lymphadenopathy. Lungs: Clear to auscultation without wheezing, rhonchi or rales. CV: Normal S1, S2 without murmurs. Abdomen: Nondistended, nontender. Skin: Warm and dry, without lesions or rashes. Extremities:  No clubbing, cyanosis or edema. Neuro:   Grossly intact.  Review of systems:  Review of systems negative except as noted in HPI / PMHx or noted below: Review of Systems  Constitutional: Negative.   HENT: Negative.   Eyes: Negative.   Respiratory: Negative.   Cardiovascular: Negative.   Gastrointestinal: Negative.   Genitourinary: Negative.   Musculoskeletal: Negative.   Skin: Negative.   Neurological: Negative.   Endo/Heme/Allergies: Negative.   Psychiatric/Behavioral: Negative.     Past medical history:  Past Medical History:  Diagnosis Date  . Arthritis of hand   . 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 Hilarie Fredrickson)  . Breast cancer,  left ([facility]) 06/26/2015   Left lumpectomy with sentinal node sampling: suggested tx plan after surgery was HER-2 based therapy with accompanying chemotherapy, as well as radiation, followed by anti-estrogens.   However, as of 08/2015 and 12/2018 f/u with Dr. Jana Hakim she has declined ALL chemo/med/radiation tx's and wants her port removed.  No sign of dz recurrence on onc f/u 12/2017 and 12/2018..   . Endometrial polyp   . Endometriosis   . Fibroids   . GERD (gastroesophageal reflux disease)    +Barretts esophagus on endo 09/2012  . Globus sensation   . H/O hiatal hernia   . Helicobacter pylori (H. pylori)    positive serology 07/2012  . History of iron deficiency anemia    pt does not tolerate oral iron; plan for feraheme per oncologist as of 06/2016  . Hyperlipidemia   . Menorrhagia    has led to iron def anemia.  Required feraheme on 2 occasions.  . Rosacea   . SAB (spontaneous abortion)   . Shortness of breath    on exertion  . Sleep apnea    no CPAP    Past surgical history:  Past Surgical History:  Procedure Laterality Date  . BREAST BIOPSY  06/29/15   Core needle: + invasive mammary carcinoma  . BREAST LUMPECTOMY WITH RADIOACTIVE SEED AND SENTINEL LYMPH NODE BIOPSY Left 07/28/2015   Path: invasive ductal carcinoma, neg margins, sentinal LN x 3 neg for tumor.  Procedure: LEFT BREAST LUMPECTOMY WITH RADIOACTIVE SEED AND SENTINEL LYMPH NODE BIOPSY;  Surgeon: Erroll Luna, MD;  Location: [facility];  Service: General;  Laterality: Left;  . D & C HYSTEROSCOPY/ POLYPECTOMY/ DX LAPAROSCOPY/ LYSIS ADHESIONS/ ABLATION ENDOMETRIOSIS  04-10-2009  . DILATATION & CURETTAGE/HYSTEROSCOPY WITH TRUECLEAR N/A 03/29/2013   Procedure: DILATATION & CURETTAGE/HYSTEROSCOPY WITH TRUECLEAR;  Surgeon: Marylynn Pearson, MD;  Location: [facility];  Service: Gynecology;  Laterality: N/A;  . DILATION AND CURETTAGE OF UTERUS    . ESOPHAGOGASTRODUODENOSCOPY  10/01/12; 06/2016   chronic inactive gastritis (H. pylori neg, no dysplasia or malig),+ Barretts esophagus (Dr. Hilarie Fredrickson).  2017 repeat showed Barrett's esoph, o/w normal.  Repeat 3 yrs recommended.  . EXP. LAP. LEFT OVARIAN CYSTECTOMY / LYSIS ADHESIONS/  MYOMECTOMY  2008  . HYSTEROSCOPY  2010  . HYSTEROSCOPY WITH D & C  08/25/2011   Procedure: DILATATION AND CURETTAGE /HYSTEROSCOPY;  Surgeon: Marylynn Pearson;  Location: [facility];  Service: Gynecology;;  . LAPAROSCOPIC APPENDECTOMY N/A 06/07/2017   Procedure: APPENDECTOMY LAPAROSCOPIC;  Surgeon: Excell Seltzer, MD;  Location: [facility];  Service: General;  Laterality: N/A;  . MYOMECTOMY    . MYOMECTOMY ABDOMINAL APPROACH    . POLYPECTOMY  2013   ENDOMETRIAL POLYP  . PORTACATH PLACEMENT Right 07/28/2015   Procedure: INSERTION PORT-A-CATH WITH ULTRA SOUND;  Surgeon: Erroll Luna, MD;  Location: [facility];  Service: General;  Laterality: Right;  . WISDOM TOOTH EXTRACTION      Family history: Family History  Problem Relation Age of Onset  . Prostate cancer Father   . Hypertension Father   . Anuerysm Father   . Heart disease Father   . Heart disease Mother   . Fibroids Sister        uterine  . Uterine cancer Maternal Grandmother   . Heart disease Maternal Aunt   . Heart disease Maternal Uncle   . Heart disease Paternal Uncle   . Stomach cancer Other        cousin  . Diabetes Other  half sister and brother  . Esophageal cancer Neg Hx   . Colon cancer Neg Hx     Social history: Social History   Socioeconomic History  . Marital status: Legally Separated    Spouse name: Not on file  . Number of children: 0  . Years of education: Not on file  . Highest education level: Not on file  Occupational History    Employer: [employer]  Tobacco Use  . Smoking status: Never Smoker  . Smokeless tobacco: Never Used  Substance and Sexual Activity  . Alcohol use: No  . Drug use: No  . Sexual activity: Yes    Birth control/protection: None  Other Topics Concern  . Not on file  Social History Narrative   Married but separated.  No children.   Orig from [place], [place].  Has lived in [place] since approx 2006.   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   No tobacco, occas alc use, no drug use.       Social Determinants of Health   Financial Resource Strain:   . Difficulty of Paying Living Expenses: Not on file  Food Insecurity:   . Worried About Charity fundraiser in the Last Year: Not on file  . Ran Out of Food in the Last Year: Not on file  Transportation Needs:   . Lack of Transportation (Medical): Not on file  . Lack of Transportation (Non-Medical): Not on file  Physical Activity:   . Days of Exercise per Week: Not on file  . Minutes of Exercise per Session: Not on file  Stress:   . Feeling of Stress : Not on file  Social Connections:   . Frequency of Communication with Friends and Family: Not on file  . Frequency of Social Gatherings with Friends and Family: Not on file  . Attends Religious Services: Not on file  . Active Member of Clubs or Organizations: Not on file  . Attends Archivist Meetings: Not on file  . Marital Status: Not on file  Intimate Partner Violence:   . Fear of Current or Ex-Partner: Not on file  . Emotionally Abused: Not on file  . Physically Abused: Not on file  . Sexually Abused: Not on file    Environmental History: The patient lives in a 56 year old house with vinyl floors, gas heat, and central air.  There is no known mold/water damage in the home.  There are no pets in the home.  She is a non-smoker.  Current Outpatient Medications  Medication Sig Dispense Refill  . FIBER SELECT GUMMIES CHEW Chew 2 capsules by mouth daily.    . pantoprazole (PROTONIX) 20 MG tablet     . pantoprazole (PROTONIX) 40 MG tablet Take 1 tablet (40 mg total) by mouth 2 (two) times daily. MUST KEEP 06/2018 OFFICE VISIT FOR FURTHER REFILLS 60 tablet 1  . venlafaxine XR (EFFEXOR-XR) 37.5 MG 24 hr capsule      No current facility-administered medications for this visit.    Known medication allergies: No Known Allergies  I appreciate the opportunity to take part  in Parilee's care. Please do not hesitate to contact me with questions.  Sincerely,   R. Edgar Frisk, MD

## 2019-10-01 ENCOUNTER — Other Ambulatory Visit: Payer: Commercial Managed Care - PPO

## 2019-12-26 ENCOUNTER — Inpatient Hospital Stay: Payer: Managed Care, Other (non HMO) | Attending: Oncology | Admitting: Oncology

## 2019-12-26 ENCOUNTER — Inpatient Hospital Stay: Payer: Managed Care, Other (non HMO)

## 2019-12-26 ENCOUNTER — Other Ambulatory Visit: Payer: Self-pay

## 2019-12-26 VITALS — BP 106/71 | HR 67 | Temp 97.2°F | Resp 20 | Ht 62.5 in | Wt 115.8 lb

## 2019-12-26 DIAGNOSIS — L719 Rosacea, unspecified: Secondary | ICD-10-CM | POA: Diagnosis not present

## 2019-12-26 DIAGNOSIS — D508 Other iron deficiency anemias: Secondary | ICD-10-CM

## 2019-12-26 DIAGNOSIS — C50412 Malignant neoplasm of upper-outer quadrant of left female breast: Secondary | ICD-10-CM

## 2019-12-26 DIAGNOSIS — D500 Iron deficiency anemia secondary to blood loss (chronic): Secondary | ICD-10-CM | POA: Insufficient documentation

## 2019-12-26 DIAGNOSIS — Z170 Estrogen receptor positive status [ER+]: Secondary | ICD-10-CM

## 2019-12-26 DIAGNOSIS — Z853 Personal history of malignant neoplasm of breast: Secondary | ICD-10-CM | POA: Insufficient documentation

## 2019-12-26 DIAGNOSIS — Z79899 Other long term (current) drug therapy: Secondary | ICD-10-CM | POA: Diagnosis not present

## 2019-12-26 LAB — CBC WITH DIFFERENTIAL/PLATELET
Abs Immature Granulocytes: 0.01 10*3/uL (ref 0.00–0.07)
Basophils Absolute: 0 10*3/uL (ref 0.0–0.1)
Basophils Relative: 1 %
Eosinophils Absolute: 0.2 10*3/uL (ref 0.0–0.5)
Eosinophils Relative: 4 %
HCT: 38.3 % (ref 36.0–46.0)
Hemoglobin: 12.7 g/dL (ref 12.0–15.0)
Immature Granulocytes: 0 %
Lymphocytes Relative: 25 %
Lymphs Abs: 1.2 10*3/uL (ref 0.7–4.0)
MCH: 29.9 pg (ref 26.0–34.0)
MCHC: 33.2 g/dL (ref 30.0–36.0)
MCV: 90.1 fL (ref 80.0–100.0)
Monocytes Absolute: 0.4 10*3/uL (ref 0.1–1.0)
Monocytes Relative: 7 %
Neutro Abs: 3.2 10*3/uL (ref 1.7–7.7)
Neutrophils Relative %: 63 %
Platelets: 188 10*3/uL (ref 150–400)
RBC: 4.25 MIL/uL (ref 3.87–5.11)
RDW: 13.1 % (ref 11.5–15.5)
WBC: 5.1 10*3/uL (ref 4.0–10.5)
nRBC: 0 % (ref 0.0–0.2)

## 2019-12-26 LAB — COMPREHENSIVE METABOLIC PANEL
ALT: 17 U/L (ref 0–44)
AST: 19 U/L (ref 15–41)
Albumin: 4 g/dL (ref 3.5–5.0)
Alkaline Phosphatase: 100 U/L (ref 38–126)
Anion gap: 8 (ref 5–15)
BUN: 12 mg/dL (ref 6–20)
CO2: 29 mmol/L (ref 22–32)
Calcium: 9.9 mg/dL (ref 8.9–10.3)
Chloride: 105 mmol/L (ref 98–111)
Creatinine, Ser: 0.85 mg/dL (ref 0.44–1.00)
GFR calc Af Amer: >60 mL/min (ref >60)
GFR calc non Af Amer: >60 mL/min (ref >60)
Glucose, Bld: 106 mg/dL — ABNORMAL HIGH (ref 70–99)
Potassium: 4.4 mmol/L (ref 3.5–5.1)
Sodium: 142 mmol/L (ref 135–145)
Total Bilirubin: 0.7 mg/dL (ref 0.3–1.2)
Total Protein: 7.4 g/dL (ref 6.5–8.1)

## 2019-12-26 LAB — FERRITIN: Ferritin: 23 ng/mL (ref 11–307)

## 2019-12-26 NOTE — Progress Notes (Signed)
[facility]  [phone] [phone]     ID: GILA LAUF DOB: 01-30-1961  MR#: [id number]  HFW#:[id number]  Patient Care Team: Patient, No Pcp Per as PCP - General (General Practice) Erroll Luna, MD as Consulting Physician (General Surgery) Cedrica Brune, Virgie Dad, MD as Consulting Physician (Oncology) Pyrtle, Lajuan Lines, MD as Consulting Physician (Gastroenterology) Crissie Reese, MD as Consulting Physician (Plastic Surgery) PCP: Patient, No Pcp Per GYN: Marylynn Pearson MD OTHER MD: Thea Silversmith MD  CHIEF COMPLAINT:  Triple positive breast cancer  CURRENT TREATMENT: Observation   INTERVAL HISTORY: [name] returns today for follow-up of her triple positive breast cancer.  The interval history is generally unremarkable.  She has been working from home.  She lives by herself with no pets.  She is still separated but not divorced from her husband.  She has not undergone mammogram in over one year.  Her last mammogram was on 07/04/2017 and showed no evidence of malignance and repeat mammogram was recommended for 06/2018.  She had breast density category C.   REVIEW OF SYSTEMS: Sharesa twisted her left ankle about 4 weeks ago and has been having problems since.  She goes to a chiropractor and was given a medication that she says is really helped her arthritis all over.  However even before she had this problem she was really not exercising regularly although she did take walks occasionally.  She has had no unusual headaches visual changes cough phlegm production pleurisy or shortness of breath.  There is been no change in bowel or bladder habits.  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  BREAST CANCER HISTORY: From the original intake note:  Dwight had screening mammography in September 2015 showing left breast calcifications. She was recalled for diagnostic left mammogram 05/06/2014 at the breast  Center. This found the breast density to be category C. Magnified views found the calcifications to be consistent with "milk of calcium". Accordingly she was set up for routine screening mammography a year later. However, sometime in July 2016 she noted a bump in her left breast. She eventually brought it to the attention of her primary care physician, who felt some areas of concern in the upper outer quadrants of both breasts.  Screening mammography 06/09/2015 was unremarkable. Bilateral diagnostic mammography with tomography at the breast Center 06/11/2015 found a 7 mm benign cyst in the right breast at the 10:00 position. In the left breast there was a hypoechoic oval mass at the 1:00 position 3 cm from the nipple measuring 10 mm. Biopsy of the left breast mass 06/26/2015 showed ([id number]) an invasive ductal carcinoma, E-cadherin positive, grade 2, estrogen receptor 80% positive, progesterone receptor 90% positive, both with strong staining intensity, with an MIB-1 of 5%, and HER-2 amplified, the signals ratio being 2.27 and the number per cell 5.45.  Bilateral breast MRI 07/06/2015 found a 1 cm mass in the upper outer left breast with central clip artifact. Laterally and inferior to this there was a TRAM track area of enhancement. This was consistent with benign biopsy tract changes. There were no abnormal appearing lymph nodes. There were 2 subcentimeter foci in the liver which were noted to be hepatic cysts in a prior abdominal CT  Her subsequent history is as detailed below.    PAST MEDICAL HISTORY: Past Medical History:  Diagnosis Date  . Arthritis of hand   . Barrett esophagus 06/2016   Needs  repeat EGD 06/2019 (Dr. Hilarie Fredrickson)  . Breast cancer, left ([facility]) 06/26/2015   Left lumpectomy with sentinal node sampling: suggested tx plan after surgery was HER-2 based therapy with accompanying chemotherapy, as well as radiation, followed by anti-estrogens.  However, as of 08/2015 and 12/2018 f/u with  Dr. Jana Hakim she has declined ALL chemo/med/radiation tx's and wants her port removed.  No sign of dz recurrence on onc f/u 12/2017 and 12/2018..   . Endometrial polyp   . Endometriosis   . Fibroids   . GERD (gastroesophageal reflux disease)    +Barretts esophagus on endo 09/2012  . Globus sensation   . H/O hiatal hernia   . Helicobacter pylori (H. pylori)    positive serology 07/2012  . History of iron deficiency anemia    pt does not tolerate oral iron; plan for feraheme per oncologist as of 06/2016  . Hyperlipidemia   . Menorrhagia    has led to iron def anemia.  Required feraheme on 2 occasions.  . Rosacea   . SAB (spontaneous abortion)   . Shortness of breath    on exertion  . Sleep apnea    no CPAP    PAST SURGICAL HISTORY: Past Surgical History:  Procedure Laterality Date  . BREAST BIOPSY  57/14/16   Core needle: + invasive mammary carcinoma  . BREAST LUMPECTOMY WITH RADIOACTIVE SEED AND SENTINEL LYMPH NODE BIOPSY Left 07/28/2015   Path: invasive ductal carcinoma, neg margins, sentinal LN x 3 neg for tumor.  Procedure: LEFT BREAST LUMPECTOMY WITH RADIOACTIVE SEED AND SENTINEL LYMPH NODE BIOPSY;  Surgeon: Erroll Luna, MD;  Location: [facility];  Service: General;  Laterality: Left;  . D & C HYSTEROSCOPY/ POLYPECTOMY/ DX LAPAROSCOPY/ LYSIS ADHESIONS/ ABLATION ENDOMETRIOSIS  04-10-2009  . DILATATION & CURETTAGE/HYSTEROSCOPY WITH TRUECLEAR N/A 03/29/2013   Procedure: DILATATION & CURETTAGE/HYSTEROSCOPY WITH TRUECLEAR;  Surgeon: Marylynn Pearson, MD;  Location: [facility];  Service: Gynecology;  Laterality: N/A;  . DILATION AND CURETTAGE OF UTERUS    . ESOPHAGOGASTRODUODENOSCOPY  10/01/12; 06/2016   chronic inactive gastritis (H. pylori neg, no dysplasia or malig),+ Barretts esophagus (Dr. Hilarie Fredrickson).  2017 repeat showed Barrett's esoph, o/w normal.  Repeat 3 yrs recommended.  . EXP. LAP. LEFT OVARIAN CYSTECTOMY / LYSIS ADHESIONS/ MYOMECTOMY  2008  . HYSTEROSCOPY  2010  .  HYSTEROSCOPY WITH D & C  08/25/2011   Procedure: DILATATION AND CURETTAGE /HYSTEROSCOPY;  Surgeon: Marylynn Pearson;  Location: [facility];  Service: Gynecology;;  . LAPAROSCOPIC APPENDECTOMY N/A 06/07/2017   Procedure: APPENDECTOMY LAPAROSCOPIC;  Surgeon: Excell Seltzer, MD;  Location: [facility];  Service: General;  Laterality: N/A;  . MYOMECTOMY    . MYOMECTOMY ABDOMINAL APPROACH    . POLYPECTOMY  2013   ENDOMETRIAL POLYP  . PORTACATH PLACEMENT Right 07/28/2015   Procedure: INSERTION PORT-A-CATH WITH ULTRA SOUND;  Surgeon: Erroll Luna, MD;  Location: [facility];  Service: General;  Laterality: Right;  . WISDOM TOOTH EXTRACTION      FAMILY HISTORY: Family History  Problem Relation Age of Onset  . Prostate cancer Father   . Hypertension Father   . Anuerysm Father   . Heart disease Father   . Heart disease Mother   . Fibroids Sister        uterine  . Uterine cancer Maternal Grandmother   . Heart disease Maternal Aunt   . Heart disease Maternal Uncle   . Heart disease Paternal Uncle   . Stomach cancer Other  cousin  . Diabetes Other        half sister and brother  . Esophageal cancer Neg Hx   . Colon cancer Neg Hx    the patient's father died from a brain aneurysm at the age of 8. The patient's mother is living, age 30. The patient has 3 brothers, one sister. The patient's father was diagnosed with prostate cancer shortly before his death. On the mother'side, the grandmother may have had uterine cancer. The patient is not aware of any breast or ovarian cancers in the family  GYNECOLOGIC HISTORY:  No LMP recorded. Menarche age 25, the patient is G4 P0. She tells me she had 4 miscarriages felt to be due to fibroids. She had a myomectomy procedure after which she was unable to become pregnant.  Her last menstrual period was December 2019  SOCIAL HISTORY:  Sanii works for 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 She is originally from  [place], [place].    ADVANCED DIRECTIVES: We discussed the fact that if the patient is not legally divorced, her husband, though separated, might claim to be her healthcare power of attorney. Accordingly I have given her the appropriate forms for her to complete and notarize at her discretion.--This was again reviewed on her 12/26/2018 visit in on her 12/26/2019 visit.  She states that she is comfortable with her separated husband being her healthcare power of attorney   HEALTH MAINTENANCE: Social History   Tobacco Use  . Smoking status: Never Smoker  . Smokeless tobacco: Never Used  Substance Use Topics  . Alcohol use: No  . Drug use: No     Colonoscopy: 08/13/2013/Pyrtle  PAP: [facility]  Bone density:  Lipid panel:  No Known Allergies  Current Outpatient Medications  Medication Sig Dispense Refill  . FIBER SELECT GUMMIES CHEW Chew 2 capsules by mouth daily.    . pantoprazole (PROTONIX) 20 MG tablet     . pantoprazole (PROTONIX) 40 MG tablet Take 1 tablet (40 mg total) by mouth 2 (two) times daily. MUST KEEP 06/2018 OFFICE VISIT FOR FURTHER REFILLS 60 tablet 1  . venlafaxine XR (EFFEXOR-XR) 37.5 MG 24 hr capsule      No current facility-administered medications for this visit.    OBJECTIVE: Latin woman in no acute distress Vitals:   12/26/19 1350  BP: 106/71  Pulse: 67  Resp: 20  Temp: (!) 97.2 F (36.2 C)  SpO2: 100%     Body mass index is 20.84 kg/m.    ECOG FS:1 - Symptomatic but completely ambulatory  Sclerae unicteric, EOMs intact Oropharynx clear and moist No cervical or supraclavicular adenopathy Lungs no rales or rhonchi Heart regular rate and rhythm Abd soft, nontender, positive bowel sounds MSK no focal spinal tenderness, no upper extremity lymphedema Neuro: nonfocal, well oriented, appropriate affect Breasts: The right breast is unremarkable.  The left breast is status post lumpectomy.  There is no evidence of local recurrence.  Both axillae are  benign.    LAB RESULTS:  CMP     Component Value Date/Time   NA 140 12/26/2018 1421   NA 138 06/22/2017 1553   K 4.2 12/26/2018 1421   K 3.9 06/22/2017 1553   CL 103 12/26/2018 1421   CO2 30 12/26/2018 1421   CO2 23 06/22/2017 1553   GLUCOSE 91 12/26/2018 1421   GLUCOSE 109 06/22/2017 1553   BUN 14 12/26/2018 1421   BUN 11.2 06/22/2017 1553   CREATININE 0.82 12/26/2018 1421   CREATININE 0.84  12/27/2017 1421   CREATININE 0.8 06/22/2017 1553   CALCIUM 10.0 12/26/2018 1421   CALCIUM 9.3 06/22/2017 1553   PROT 7.3 12/26/2018 1421   PROT 7.4 06/22/2017 1553   ALBUMIN 3.9 12/26/2018 1421   ALBUMIN 3.9 06/22/2017 1553   AST 17 12/26/2018 1421   AST 14 12/27/2017 1421   AST 18 06/22/2017 1553   ALT 14 12/26/2018 1421   ALT 10 12/27/2017 1421   ALT 22 06/22/2017 1553   ALKPHOS 82 12/26/2018 1421   ALKPHOS 58 06/22/2017 1553   BILITOT 0.7 12/26/2018 1421   BILITOT 0.4 12/27/2017 1421   BILITOT 0.67 06/22/2017 1553   GFRNONAA >60 12/26/2018 1421   GFRNONAA >60 12/27/2017 1421   GFRAA >60 12/26/2018 1421   GFRAA >60 12/27/2017 1421    INo results found for: SPEP, UPEP  Lab Results  Component Value Date   WBC 5.1 12/26/2019   NEUTROABS 3.2 12/26/2019   HGB 12.7 12/26/2019   HCT 38.3 12/26/2019   MCV 90.1 12/26/2019   PLT 188 12/26/2019      Chemistry      Component Value Date/Time   NA 140 12/26/2018 1421   NA 138 06/22/2017 1553   K 4.2 12/26/2018 1421   K 3.9 06/22/2017 1553   CL 103 12/26/2018 1421   CO2 30 12/26/2018 1421   CO2 23 06/22/2017 1553   BUN 14 12/26/2018 1421   BUN 11.2 06/22/2017 1553   CREATININE 0.82 12/26/2018 1421   CREATININE 0.84 12/27/2017 1421   CREATININE 0.8 06/22/2017 1553      Component Value Date/Time   CALCIUM 10.0 12/26/2018 1421   CALCIUM 9.3 06/22/2017 1553   ALKPHOS 82 12/26/2018 1421   ALKPHOS 58 06/22/2017 1553   AST 17 12/26/2018 1421   AST 14 12/27/2017 1421   AST 18 06/22/2017 1553   ALT 14 12/26/2018 1421    ALT 10 12/27/2017 1421   ALT 22 06/22/2017 1553   BILITOT 0.7 12/26/2018 1421   BILITOT 0.4 12/27/2017 1421   BILITOT 0.67 06/22/2017 1553       No results found for: LABCA2  No components found for: LABCA125  No results for input(s): INR in the last 168 hours.  Urinalysis    Component Value Date/Time   COLORURINE STRAW (A) 05/14/2018 1018   APPEARANCEUR CLEAR 05/14/2018 1018   LABSPEC 1.004 (L) 05/14/2018 1018   PHURINE 8.0 05/14/2018 1018   GLUCOSEU NEGATIVE 05/14/2018 1018   HGBUR NEGATIVE 05/14/2018 1018   BILIRUBINUR NEGATIVE 05/14/2018 1018   KETONESUR NEGATIVE 05/14/2018 1018   PROTEINUR NEGATIVE 05/14/2018 1018   UROBILINOGEN 0.2 05/02/2015 1455   NITRITE NEGATIVE 05/14/2018 1018   LEUKOCYTESUR NEGATIVE 05/14/2018 1018    STUDIES: No results found.   ASSESSMENT: 59 y.o. [facility] woman status post left breast biopsy 06/26/2015 for a clinical T1b N0, stage IA invasive ductal carcinoma, grade 2, estrogen and progesterone receptor positive, HER-2 amplified, with a low MIB-1  (1) status post left lumpectomy and sentinel lymph node sampling 07/28/2015 for a  pT1b pN0, stage IA invasive ductal carcinoma, with negative margins.   (2) standard therapy (antiestrogens and  immunotherapy with chemotherapy followed by radiation and anti-estrogens)  refused by patient after extensive discussion 08/22/2015  (3) iron deficiency anemia secondary to menorrhagia, associated with fibroids   (a) status post Feraheme June 2018 (x2)  (b) received Feraheme again December 2018  PLAN: Nilani is now 4-1/2 years out from definitive surgery for her breast cancer with no  evidence of disease activity.  This is favorable.  She is behind on mammography I went ahead and entered the order for her to have her diagnostic mammogram at the breast center within the next week or 2.  This will be her last diagnostic as she will be "graduating" to screening after this.  In fact she is graduating from  follow-up here.  All she needs in terms of breast cancer follow-up is a yearly physician breast exam and yearly mammography  I will be glad to see Hayzlee again at any point in the future if and when the need arises but as of now are making no further routine appointment for her here.  Total encounter time 25 minutes.Virgie Dad Aeisha Minarik MD   12/26/19 2:03 PM Medical Oncology and Hematology [facility] [address] [place], [place] [address] Tel. [phone]    Fax. [phone]

## 2019-12-27 ENCOUNTER — Telehealth: Payer: Self-pay | Admitting: Oncology

## 2019-12-27 NOTE — Telephone Encounter (Signed)
No changes made to pt's schedule per 5/13 los.

## 2020-03-20 ENCOUNTER — Telehealth: Payer: Self-pay | Admitting: *Deleted

## 2020-03-20 DIAGNOSIS — C50412 Malignant neoplasm of upper-outer quadrant of left female breast: Secondary | ICD-10-CM

## 2020-03-20 DIAGNOSIS — Z170 Estrogen receptor positive status [ER+]: Secondary | ICD-10-CM

## 2020-03-20 NOTE — Telephone Encounter (Signed)
This RN spoke with pt per her call stating she has had ongoing discomfort in her left shoulder and was inquiring about getting acupuncture " and want Dr Jana Hakim to give me the ok to do this ".  She was also asking about getting the Covid vaccine.  This RN inquired further with left arm issue - with pt denying any swelling or redness " just a pain in my shoulder and upper arm that makes me feel like a 59 year old lady " -  This RN discussed acupuncture with primary concern being related to LND and knowledge of the acupuncturist.  This RN discussed possible benefit per assessment by PT who [place] and requested a referral.  She stated her concern with the Covid vaccine is related to the " damage to the heart and my family has history of cardiac issues "  This RN discussed above with pt including facts that overall pt's tolerate the vaccine very well.  Referral for PT placed, all questions answered and call to the [facility] per pt stating she has not had her mammogram.  Note pt was to have her mammogram in May- the [facility] states they tried to call her x 2 with no return call- they will call her now.

## 2020-04-07 ENCOUNTER — Ambulatory Visit: Payer: Managed Care, Other (non HMO) | Admitting: Physical Therapy

## 2020-04-13 ENCOUNTER — Other Ambulatory Visit: Payer: Self-pay

## 2020-04-13 ENCOUNTER — Ambulatory Visit
Admission: RE | Admit: 2020-04-13 | Discharge: 2020-04-13 | Disposition: A | Discharge status: Home / Self Care | Payer: Managed Care, Other (non HMO) | Source: Ambulatory Visit | Attending: Oncology | Admitting: Oncology

## 2020-04-13 DIAGNOSIS — Z170 Estrogen receptor positive status [ER+]: Secondary | ICD-10-CM

## 2020-04-13 DIAGNOSIS — L719 Rosacea, unspecified: Secondary | ICD-10-CM

## 2020-04-13 DIAGNOSIS — D508 Other iron deficiency anemias: Secondary | ICD-10-CM

## 2020-04-13 DIAGNOSIS — C50412 Malignant neoplasm of upper-outer quadrant of left female breast: Secondary | ICD-10-CM

## 2020-04-14 ENCOUNTER — Inpatient Hospital Stay: Payer: Managed Care, Other (non HMO) | Admitting: Oncology

## 2020-04-30 ENCOUNTER — Other Ambulatory Visit: Payer: Self-pay

## 2020-04-30 ENCOUNTER — Encounter: Payer: Self-pay | Admitting: Rehabilitation

## 2020-04-30 ENCOUNTER — Ambulatory Visit: Payer: Managed Care, Other (non HMO) | Attending: Oncology | Admitting: Rehabilitation

## 2020-04-30 DIAGNOSIS — Z483 Aftercare following surgery for neoplasm: Secondary | ICD-10-CM | POA: Insufficient documentation

## 2020-04-30 DIAGNOSIS — G8929 Other chronic pain: Secondary | ICD-10-CM | POA: Diagnosis present

## 2020-04-30 DIAGNOSIS — M25512 Pain in left shoulder: Secondary | ICD-10-CM | POA: Insufficient documentation

## 2020-04-30 NOTE — Patient Instructions (Signed)
Access Code: [email or web address]: [email or web address]: 09/16/2021Prepared by: Marcene Brawn TevisExercises  Seated Scapular Retraction - 3 x daily - 7 x weekly - 1 sets - 10 reps - 3 second hold  Seated Scapular Clock (1 to 7) - 1 x daily - 7 x weekly - 10 reps  Supine Isometric Shoulder Extension with Towel - 1 x daily - 7 x weekly - 1-3 sets - 10 reps - 2-3 seconds hold  Supine Shoulder External Rotation with Dowel - 1 x daily - 7 x weekly - 1 sets - 10 reps - 5 seconds hold

## 2020-04-30 NOTE — Therapy (Signed)
[facility] [address] [place], [place], [address] Phone: [phone]   Fax:  [phone]  Physical Therapy Evaluation  Patient Details  Name: Meagan [name] MRN: [id number] Date of Birth: [birth year] Referring Provider (PT): Dr. Darnelle Catalan   Encounter Date: 04/30/2020   PT End of Session - 04/30/20 1501    Visit Number 1    Number of Visits 4    Date for PT Re-Evaluation 08/31/19    PT Start Time 1403    PT Stop Time 1455    PT Time Calculation (min) 52 min    Activity Tolerance Patient tolerated treatment well    Behavior During Therapy [facility] for tasks assessed/performed           Past Medical History:  Diagnosis Date  . Arthritis of hand   . 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 Rhea Belton)  . Breast cancer, left (HCC) 06/26/2015   Left lumpectomy with sentinal node sampling: suggested tx plan after surgery was HER-2 based therapy with accompanying chemotherapy, as well as radiation, followed by anti-estrogens.  However, as of 08/2015 and 12/2018 f/u with Dr. Darnelle Catalan she has declined ALL chemo/med/radiation tx's and wants her port removed.  No sign of dz recurrence on onc f/u 12/2017 and 12/2018..   . Endometrial polyp   . Endometriosis   . Fibroids   . GERD (gastroesophageal reflux disease)    +Barretts esophagus on endo 09/2012  . Globus sensation   . H/O hiatal hernia   . Helicobacter pylori (H. pylori)    positive serology 07/2012  . History of iron deficiency anemia    pt does not tolerate oral iron; plan for feraheme per oncologist as of 06/2016  . Hyperlipidemia   . Menorrhagia    has led to iron def anemia.  Required feraheme on 2 occasions.  . Rosacea   . SAB (spontaneous abortion)   . Shortness of breath    on exertion  . Sleep apnea    no CPAP    Past Surgical History:  Procedure Laterality Date  . BREAST BIOPSY  06/29/15   Core needle: + invasive mammary carcinoma  . BREAST  LUMPECTOMY Left 2016  . BREAST LUMPECTOMY WITH RADIOACTIVE SEED AND SENTINEL LYMPH NODE BIOPSY Left 07/28/2015   Path: invasive ductal carcinoma, neg margins, sentinal LN x 3 neg for tumor.  Procedure: LEFT BREAST LUMPECTOMY WITH RADIOACTIVE SEED AND SENTINEL LYMPH NODE BIOPSY;  Surgeon: Harriette Bouillon, MD;  Location: [facility];  Service: General;  Laterality: Left;  . D & C HYSTEROSCOPY/ POLYPECTOMY/ DX LAPAROSCOPY/ LYSIS ADHESIONS/ ABLATION ENDOMETRIOSIS  04-10-2009  . DILATATION & CURETTAGE/HYSTEROSCOPY WITH TRUECLEAR N/A 03/29/2013   Procedure: DILATATION & CURETTAGE/HYSTEROSCOPY WITH TRUECLEAR;  Surgeon: Zelphia Cairo, MD;  Location: [facility];  Service: Gynecology;  Laterality: N/A;  . DILATION AND CURETTAGE OF UTERUS    . ESOPHAGOGASTRODUODENOSCOPY  10/01/12; 06/2016   chronic inactive gastritis (H. pylori neg, no dysplasia or malig),+ Barretts esophagus (Dr. Rhea Belton).  2017 repeat showed Barrett's esoph, o/w normal.  Repeat 3 yrs recommended.  . EXP. LAP. LEFT OVARIAN CYSTECTOMY / LYSIS ADHESIONS/ MYOMECTOMY  2008  . HYSTEROSCOPY  2010  . HYSTEROSCOPY WITH D & C  08/25/2011   Procedure: DILATATION AND CURETTAGE /HYSTEROSCOPY;  Surgeon: Zelphia Cairo;  Location: [facility];  Service: Gynecology;;  . LAPAROSCOPIC APPENDECTOMY N/A 06/07/2017   Procedure: APPENDECTOMY LAPAROSCOPIC;  Surgeon: Glenna Fellows, MD;  Location: [facility];  Service: General;  Laterality: N/A;  . MYOMECTOMY    . MYOMECTOMY ABDOMINAL APPROACH    . POLYPECTOMY  2013   ENDOMETRIAL POLYP  . PORTACATH PLACEMENT Right 07/28/2015   Procedure: INSERTION PORT-A-CATH WITH ULTRA SOUND;  Surgeon: Harriette Bouillon, MD;  Location: [facility];  Service: General;  Laterality: Right;  . WISDOM TOOTH EXTRACTION      There were no vitals filed for this visit.    Subjective Assessment - 04/30/20 1358    Subjective I am having some pain with moving the left shoulder overhead.  I used to  be able to wash my back and now.  It has been getting worse and worse x 1 year.  It was maybe after working with my desk raised.  I have also been having hand pain on both sides x 3 months    Pertinent History History of left lumpectomy and SLNB with 3 lymph nodes removed in 2016 by Dr. Luisa Hart.  Refusal of recommended radiation and chemotherapy.  Other history includes hysteroscopy with D&C    Limitations House hold activities;Lifting    Patient Stated Goals decrease the pain    Currently in Pain? Yes   I don't have any pain at rest just moving   Pain Score 9    every time I try to raise my arm or put it behind my back   Pain Location Shoulder    Pain Orientation Left;Right    Pain Descriptors / Indicators Shooting    Pain Type Chronic pain    Pain Radiating Towards the upper arms    Pain Onset More than a month ago    Pain Frequency Intermittent    Aggravating Factors  movement of arms over head and behind the head    Pain Relieving Factors I have tried heat, cold, sometimes I take Ibuprofen which helps a bit    Effect of Pain on Daily Activities I feel like a 59 year old              [facility] PT Assessment - 04/30/20 0001      Assessment   Medical Diagnosis left breast cancer    Referring Provider (PT) Dr. Darnelle Catalan    Onset Date/Surgical Date 08/15/17    Hand Dominance Right    Prior Therapy yes post surgery      Precautions   Precaution Comments lymphedema      Restrictions   Weight Bearing Restrictions No      Balance Screen   Has the patient fallen in the past 6 months No    Has the patient had a decrease in activity level because of a fear of falling?  No    Is the patient reluctant to leave their home because of a fear of falling?  No      Home Tourist information centre manager residence    Living Arrangements Spouse/significant other    Available Help at Discharge Family      Prior Function   Level of Independence Independent    Vocation Full time  employment    Vocation Requirements desk job    Leisure I try to move my shoulder but it hurts too much       Cognition   Overall Cognitive Status Within Functional Limits for tasks assessed      Coordination   Gross Motor Movements are Fluid and Coordinated Yes      Posture/Postural Control   Posture/Postural Control Postural limitations    Postural Limitations  Rounded Shoulders;Forward head      ROM / Strength   AROM / PROM / Strength AROM;PROM;Strength      AROM   AROM Assessment Site Shoulder    Right/Left Shoulder Right;Left    Right Shoulder Extension 53 Degrees   pn   Right Shoulder Flexion 175 Degrees   pn   Right Shoulder ABduction 178 Degrees   pain   Left Shoulder Extension 25 Degrees   pain   Left Shoulder Flexion 140 Degrees   pn   Left Shoulder ABduction 40 Degrees   pn   Left Shoulder External Rotation --   unable due to pain with abduction     PROM   PROM Assessment Site Shoulder    Right/Left Shoulder Right;Left    Right Shoulder Flexion 175 Degrees    Right Shoulder ABduction 175 Degrees    Right Shoulder Internal Rotation --   to belly   Right Shoulder External Rotation 90 Degrees    Left Shoulder Flexion 140 Degrees    Left Shoulder ABduction 80 Degrees    Left Shoulder Internal Rotation --   to belly   Left Shoulder External Rotation 5 Degrees   at 15deg of abduction                   Quick Dash - 04/30/20 0001    Open a tight or new jar Severe difficulty    Do heavy household chores (wash walls, wash floors) Unable    Carry a shopping bag or briefcase Mild difficulty    Wash your back Unable    Use a knife to cut food No difficulty    Recreational activities in which you take some force or impact through your arm, shoulder, or hand (golf, hammering, tennis) Severe difficulty    During the past week, to what extent has your arm, shoulder or hand problem interfered with your normal social activities with family, friends, neighbors, or  groups? Quite a bit    During the past week, to what extent has your arm, shoulder or hand problem limited your work or other regular daily activities Quite a bit    Arm, shoulder, or hand pain. Extreme    Tingling (pins and needles) in your arm, shoulder, or hand None    Difficulty Sleeping Mild difficulty    DASH Score 59.09 %            Objective measurements completed on examination: See above findings.       [place] Adult PT Treatment/Exercise - 04/30/20 0001      Exercises   Exercises Shoulder      Shoulder Exercises: ROM/Strengthening   Prot/Ret//Elev/Dep seated scapular clock x 2    Other ROM/Strengthening Exercises dowel ER too painful seated, better supinen with towel roll x 10    Other ROM/Strengthening Exercises seated scapular retractions x 6      Shoulder Exercises: Isometric Strengthening   Extension 5X5"    Extension Limitations supine                  PT Education - 04/30/20 1500    Education Details HEP, benefits of another injection for the shoulder    Person(s) Educated Patient    Methods Explanation;Demonstration;Tactile cues;Verbal cues;Handout    Comprehension Verbalized understanding;Returned demonstration;Verbal cues required;Tactile cues required;Need further instruction               PT Long Term Goals - 04/30/20 1508      PT LONG  TERM GOAL #1   Title Pt will report the pain in her left arm is decreased by 50% ( to 3/10) so that she can perform her daily activities easier     Baseline 9/10 with movement    Time 16    Period Weeks    Status New      PT LONG TERM GOAL #2   Title Pt will improve Lt shoulder PROM into extension to at least 45 degrees    Baseline 0-5 deg with pain    Time 16    Period Weeks    Status New      PT LONG TERM GOAL #3   Title Pt will improve Lt shoulder abduction to at least 160degrees to improve mobility reaching overhead    Baseline 40    Time 16    Period Weeks    Status New      PT LONG  TERM GOAL #4   Title Pt will be ind with final HEP to consist of scapular strengthening    Time 16    Period Weeks    Status New      PT LONG TERM GOAL #5   Title Pt will decrease Quick DASH score to < 30 indicating and improvment in function of left arm    Baseline 59%                  Plan - 04/30/20 1501    Clinical Impression Statement Pt returns to PT here with continued Lt shoulder pain and limited mobility. Pt has had shoulder pain since lumpectomy surgery but has worsening of pain and ROM x 3 years.  Limitations in all directions AROM/PROM most limited in PROM into ER at 0-5deg with pain.  Pt demonstrates no axillary tightness with PROM but more so impingement type pain in the posterior shoulder even before end feel met.  Tightness in the latissimus evident in sidelying and some pain near insertion, no sig ttp to the subacromial space or tendons of the rotator cuff.  Pt does have lack of [place] rhythm and evident muscle weakness of the shoulder girdle.  Pt had success in pain relief from a cortisone shoulder injection a few years ago so pt was encouraged to try this again.  Pt is also only able to do PT 1x per month so pt was made aware that this would not be very beneficial but will try for now.    Personal Factors and Comorbidities Behavior Pattern;Time since onset of injury/illness/exacerbation;Past/Current Experience;Fitness    Examination-Activity Limitations Lift;Reach Overhead    Examination-Participation Restrictions Cleaning;Laundry;Shop;Driving    Stability/Clinical Decision Making Stable/Uncomplicated    Clinical Decision Making Low    Rehab Potential Fair    PT Frequency Monthy    PT Duration --   16 weeks   PT Treatment/Interventions ADLs/Self Care Home Management;Iontophoresis 4mg /ml Dexamethasone;Therapeutic exercise;Patient/family education;Manual techniques;Manual lymph drainage;Passive range of motion;Dry needling;Taping    PT Next Visit Plan get injection? how is  the shoulder pain/AROM? Perform PROM/AAROM, scapular motion and strengthening to decrease impingement, taping, ionto PRN    PT Home Exercise Plan [email or web address]    Consulted and Agree with Plan of Care Patient           Patient will benefit from skilled therapeutic intervention in order to improve the following deficits and impairments:  Pain, Decreased range of motion  Visit Diagnosis: Chronic left shoulder pain  Aftercare following surgery for neoplasm     Problem List Patient Active  Problem List   Diagnosis Date Noted  . Rash/dermatitis 08/13/2019  . Allergic reaction 08/13/2019  . Rosacea 08/13/2019  . Dermatitis 08/13/2019  . Appendicitis 06/07/2017  . Globus sensation 09/23/2016  . Gastroesophageal reflux disease without esophagitis 09/23/2016  . Iron deficiency anemia 06/29/2016  . Iron deficiency anemia due to chronic blood loss 06/29/2016  . Malignant neoplasm of upper-outer quadrant of left breast in female, estrogen receptor positive ([place]) 07/14/2015  . Deficiency anemia 12/25/2014  . Barrett's esophagus 10/05/2012  . Gastritis 08/09/2012  . Constipation, chronic 08/09/2012  . Hyperlipidemia 08/01/2012  . Helicobacter pylori (H. pylori)     Stark Bray 04/30/2020, 3:13 PM  [place], [place], [address] Phone: [phone]   Fax:  [phone]  Name: Meagan [name] MRN: [id number] Date of Birth: Jun 23, 1961

## 2020-05-05 ENCOUNTER — Ambulatory Visit: Payer: Managed Care, Other (non HMO) | Admitting: Physical Therapy

## 2020-05-25 ENCOUNTER — Ambulatory Visit: Payer: Managed Care, Other (non HMO) | Attending: Oncology

## 2020-06-30 ENCOUNTER — Ambulatory Visit: Payer: Managed Care, Other (non HMO) | Admitting: Internal Medicine

## 2020-06-30 ENCOUNTER — Encounter: Payer: Self-pay | Admitting: Internal Medicine

## 2020-06-30 VITALS — BP 100/66 | HR 80 | Ht 63.0 in | Wt 118.0 lb

## 2020-06-30 DIAGNOSIS — K2270 Barrett's esophagus without dysplasia: Secondary | ICD-10-CM | POA: Diagnosis not present

## 2020-06-30 DIAGNOSIS — K219 Gastro-esophageal reflux disease without esophagitis: Secondary | ICD-10-CM | POA: Diagnosis not present

## 2020-06-30 NOTE — Patient Instructions (Signed)
You have been scheduled for an endoscopy. Please follow written instructions given to you at your visit today. If you use inhalers (even only as needed), please bring them with you on the day of your procedure.  If you are age 57 or younger, your body mass index should be between 19-25. Your Body mass index is 20.9 kg/m. If this is out of the aformentioned range listed, please consider follow up with your Primary Care Provider.   Due to recent changes in healthcare laws, you may see the results of your imaging and laboratory studies on MyChart before your provider has had a chance to review them.  We understand that in some cases there may be results that are confusing or concerning to you. Not all laboratory results come back in the same time frame and the provider may be waiting for multiple results in order to interpret others.  Please give [place] 48 hours in order for your provider to thoroughly review all the results before contacting the office for clarification of your results.

## 2020-06-30 NOTE — Progress Notes (Signed)
   Subjective:    Patient ID: Meagan [name], female    DOB: March 02, 1961, 59 y.o.   MRN: [id number]  HPI Pammy Vesey is a 59 year old female with a past medical history of GERD with short segment Barrett's esophagus without dysplasia, prior H. pylori status post treatment, history of breast cancer who is here for follow-up.  She is here alone today.  She was last seen by our office in February 2018 by Alonza Bogus, PA-C.  She reports that for the most part she has been doing well.  She did develop COVID-19 pneumonia in late September 2021.  She recovered from this completely with the exception of she has not regained normal taste and smell.  From a GI perspective her heartburn has been less of an issue though she still have intermittent globus sensation.  She felt intermittent joint and muscle pain which she thought may be related to pantoprazole because it was the only medication she was taking.  She is no longer using this daily and more using it on an as-needed basis.  She denies dysphagia and odynophagia.  No abdominal pain.  Bowel movements are regular as long as she continues her fiber supplementation.  Her last upper endoscopy was performed on 07/14/2016 which revealed Barrett's esophagus without dysplasia.  There was inflamed gastric type mucosa in the distal esophagus but no goblet cell metaplasia or dysplasia.  There was no H. pylori on follow-up gastric biopsies.  Last colonoscopy was normal in December 2014.   Review of Systems As per HPI, otherwise negative  Current Medications, Allergies, Past Medical History, Past Surgical History, Family History and Social History were reviewed in [employer] record.     Objective:   Physical Exam BP 100/66   Pulse 80   Ht 5\' 3"  (1.6 m)   Wt 118 lb (53.5 kg)   LMP  (LMP Unknown)   BMI 20.90 kg/m  Gen: awake, alert, NAD HEENT: anicteric CV: RRR, no mrg Pulm: CTA b/l Abd: soft, NT/ND, +BS throughout Ext: no  c/c/e Neuro: nonfocal      Assessment & Plan:  59 year old female with a past medical history of GERD with short segment Barrett's esophagus without dysplasia, prior H. pylori status post treatment, history of breast cancer who is here for follow-up.  1. GERD with history of short-segment Barrett's without dysplasia --she is due for surveillance upper endoscopy at this time for Barrett's surveillance.  We discussed how PPI is indicated given her history of Barrett's as this lowers the risk of GERD, esophagitis, Barrett's progression to dysplasia and even malignancy.  Given that we are going to repeat upper endoscopy we can assess current state of Barrett's esophagus and determine if daily PPI is medically warranted.  I expect it will be. --Upper endoscopy in the [place]; we discussed the risk, benefits and alternatives and she is agreeable and wishes to proceed --Decision to be made regarding resuming pantoprazole or other PPI given possibility of side effects after endoscopy  2.  Colon cancer screening --average risk.  Normal colonoscopy in December 2014, repeat recommended December 2024

## 2020-07-29 ENCOUNTER — Telehealth: Payer: Self-pay

## 2020-07-29 NOTE — Telephone Encounter (Signed)
Pt is scheduled to see Dr. Hilarie Fredrickson on 07/30/20 at 10am. Pt was a no show for her covid test scheduled on 07/28/20. Tried to contact patient to reschedule test. Was unable to speak with patient. Left VM to have patient call office back to reschedule.

## 2020-07-30 ENCOUNTER — Encounter: Payer: Self-pay | Admitting: Internal Medicine

## 2020-07-30 ENCOUNTER — Ambulatory Visit (AMBULATORY_SURGERY_CENTER): Payer: Managed Care, Other (non HMO) | Admitting: Internal Medicine

## 2020-07-30 ENCOUNTER — Other Ambulatory Visit: Payer: Self-pay

## 2020-07-30 VITALS — BP 121/72 | HR 74 | Temp 97.1°F | Resp 14 | Ht 63.0 in | Wt 118.0 lb

## 2020-07-30 DIAGNOSIS — K2270 Barrett's esophagus without dysplasia: Secondary | ICD-10-CM | POA: Diagnosis not present

## 2020-07-30 DIAGNOSIS — K449 Diaphragmatic hernia without obstruction or gangrene: Secondary | ICD-10-CM

## 2020-07-30 MED ORDER — SODIUM CHLORIDE 0.9 % IV SOLN
500.0000 mL | Freq: Once | INTRAVENOUS | Status: AC
Start: 2020-07-30 — End: ?

## 2020-07-30 NOTE — Patient Instructions (Signed)
Read all of the handouts given to you by your recovery room nurse.  Thank-you for choosing [place] for your healthcare needs today.  YOU HAD AN ENDOSCOPIC PROCEDURE TODAY AT [facility]:   Refer to the procedure report that was given to you for any specific questions about what was found during the examination.  If the procedure report does not answer your questions, please call your gastroenterologist to clarify.  If you requested that your care partner not be given the details of your procedure findings, then the procedure report has been included in a sealed envelope for you to review at your convenience later.  YOU SHOULD EXPECT: Some feelings of bloating in the abdomen. Passage of more gas than usual.  Walking can help get rid of the air that was put into your GI tract during the procedure and reduce the bloating.   Please Note:  You might notice some irritation and congestion in your nose or some drainage.  This is from the oxygen used during your procedure.  There is no need for concern and it should clear up in a day or so.  SYMPTOMS TO REPORT IMMEDIATELY:    Following upper endoscopy (EGD)  Vomiting of blood or coffee ground material  New chest pain or pain under the shoulder blades  Painful or persistently difficult swallowing  New shortness of breath  Fever of 100F or higher  Black, tarry-looking stools  For urgent or emergent issues, a gastroenterologist can be reached at any hour by calling [phone]. Do not use MyChart messaging for urgent concerns.    DIET:  We do recommend a small meal at first, but then you may proceed to your regular diet.  Drink plenty of fluids but you should avoid alcoholic beverages for 24 hours.  ACTIVITY:  You should plan to take it easy for the rest of today and you should NOT DRIVE or use heavy machinery until tomorrow (because of the sedation medicines used during the test).    FOLLOW UP: Our staff will call the number listed  on your records 48-72 hours following your procedure to check on you and address any questions or concerns that you may have regarding the information given to you following your procedure. If we do not reach you, we will leave a message.  We will attempt to reach you two times.  During this call, we will ask if you have developed any symptoms of COVID 19. If you develop any symptoms (ie: fever, flu-like symptoms, shortness of breath, cough etc.) before then, please call [phone].  If you test positive for Covid 19 in the 2 weeks post procedure, please call and report this information to [place].    If any biopsies were taken you will be contacted by phone or by letter within the next 1-3 weeks.  Please call us at [phone] if you have not heard about the biopsies in 3 weeks.    SIGNATURES/CONFIDENTIALITY: You and/or your care partner have signed paperwork which will be entered into your electronic medical record.  These signatures attest to the fact that that the information above on your After Visit Summary has been reviewed and is understood.  Full responsibility of the confidentiality of this discharge information lies with you and/or your care-partner.

## 2020-07-30 NOTE — Progress Notes (Signed)
Report to PACU, RN, vss, BBS= Clear.  

## 2020-07-30 NOTE — Op Note (Signed)
[facility] Patient Name: Meagan [name] Procedure Date: 07/30/2020 10:06 AM MRN: [id number] Endoscopist: Jerene Bears , MD Age: 58 Referring MD:  Date of Birth: [birth year] Gender: Female Account #: [id number] Procedure:                Upper GI endoscopy Indications:              Follow-up of Barrett's esophagus, last EGD Nov 2017 Medicines:                Monitored Anesthesia Care Procedure:                Pre-Anesthesia Assessment:                           - Prior to the procedure, a History and Physical                            was performed, and patient medications and                            allergies were reviewed. The patient's tolerance of                            previous anesthesia was also reviewed. The risks                            and benefits of the procedure and the sedation                            options and risks were discussed with the patient.                            All questions were answered, and informed consent                            was obtained. Prior Anticoagulants: The patient has                            taken no previous anticoagulant or antiplatelet                            agents. ASA Grade Assessment: II - A patient with                            mild systemic disease. After reviewing the risks                            and benefits, the patient was deemed in                            satisfactory condition to undergo the procedure.                           After obtaining informed consent, the endoscope was  passed under direct vision. Throughout the                            procedure, the patient's blood pressure, pulse, and                            oxygen saturations were monitored continuously. The                            Endoscope was introduced through the mouth, and                            advanced to the second part of duodenum. The upper                            GI  endoscopy was accomplished without difficulty.                            The patient tolerated the procedure well. Scope In: Scope Out: Findings:                 The esophagus and gastroesophageal junction were                            examined with white light and narrow band imaging                            (NBI) from a forward view and retroflexed position.                            There were esophageal mucosal changes consistent                            with short-segment Barrett's esophagus. These                            changes involved the mucosa at the upper extent of                            the gastric folds (37 cm from the incisors)                            extending to the Z-line (35 cm from the incisors).                            Circumferential salmon-colored mucosa was present                            from 36 to 37 cm and islands of salmon-colored                            mucosa were present from 35 to 36 cm. The maximum  longitudinal extent of these esophageal mucosal                            changes was 2 cm in length. Mucosa was biopsied                            with a cold forceps for histology in a targeted                            manner at intervals of 1 cm. A total of 2 specimen                            bottles were sent to pathology.                           A 3 cm hiatal hernia was present.                           A few diminutive sessile polyps were found in the                            gastric fundus. Consistent with fundic gland                            polyps, benign.                           The exam of the stomach was otherwise normal.                           The examined duodenum was normal. Complications:            No immediate complications. Estimated Blood Loss:     Estimated blood loss was minimal. Impression:               - Esophageal mucosal changes consistent with                             short-segment Barrett's esophagus. Biopsied.                           - 3 cm hiatal hernia.                           - A few benign gastric polyps.                           - Normal examined duodenum. Recommendation:           - Patient has a contact number available for                            emergencies. The signs and symptoms of potential                            delayed complications were discussed  with the                            patient. Return to normal activities tomorrow.                            Written discharge instructions were provided to the                            patient.                           - Resume previous diet.                           - Continue present medications.                           - Await pathology results.                           - Repeat upper endoscopy interval to be determined                            after pathology studies are complete for                            surveillance of Barrett's esophagus. Jerene Bears, MD 07/30/2020 10:26:48 AM This report has been signed electronically.

## 2020-07-30 NOTE — Progress Notes (Signed)
Called to room to assist during endoscopic procedure.  Patient ID and intended procedure confirmed with present staff. Received instructions for my participation in the procedure from the performing physician.  

## 2020-07-30 NOTE — Progress Notes (Signed)
Vital signs checked by:CW ? ?The medical and surgical history was reviewed and verified with the patient. ? ?

## 2020-08-03 ENCOUNTER — Telehealth: Payer: Self-pay

## 2020-08-03 NOTE — Telephone Encounter (Signed)
°  Follow up Call-  Call back number 07/30/2020  Post procedure Call Back phone  # [phone]  Permission to leave phone message Yes  Some recent data might be hidden     Patient questions:  Do you have a fever, pain , or abdominal swelling? No. Pain Score  0 *  Have you tolerated food without any problems? Yes.    Have you been able to return to your normal activities? Yes.    Do you have any questions about your discharge instructions: Diet   No. Medications  No. Follow up visit  No.  Do you have questions or concerns about your Care? No.  Actions: * If pain score is 4 or above: No action needed, pain <4.  1. Have you developed a fever since your procedure? no  2.   Have you had an respiratory symptoms (SOB or cough) since your procedure? no  3.   Have you tested positive for COVID 19 since your procedure no  4.   Have you had any family members/close contacts diagnosed with the COVID 19 since your procedure? no   If yes to any of these questions please route to Joylene John, RN and Joella Prince, RN

## 2020-08-10 ENCOUNTER — Encounter: Payer: Self-pay | Admitting: Internal Medicine

## 2020-08-24 ENCOUNTER — Other Ambulatory Visit: Payer: Self-pay | Admitting: Orthopaedic Surgery

## 2020-08-24 DIAGNOSIS — M25512 Pain in left shoulder: Secondary | ICD-10-CM

## 2020-09-11 ENCOUNTER — Ambulatory Visit
Admission: RE | Admit: 2020-09-11 | Discharge: 2020-09-11 | Disposition: A | Discharge status: Home / Self Care | Payer: Managed Care, Other (non HMO) | Source: Ambulatory Visit | Attending: Orthopaedic Surgery | Admitting: Orthopaedic Surgery

## 2020-09-11 DIAGNOSIS — M25512 Pain in left shoulder: Secondary | ICD-10-CM

## 2020-12-24 ENCOUNTER — Telehealth: Payer: Self-pay | Admitting: *Deleted

## 2020-12-24 NOTE — Telephone Encounter (Signed)
This RN returned VM from the patient per her VM stating concern " because I had blood work and my white cell count is 3.3 - need to know what Dr Jana Hakim thinks "  Per call- Talley states she had lab done at work - " and the nurse there said I may want to let my doctor know and see what if he is concerned"  Per chart review pt has been released for follow up per her history of breast cancer.  Noted prior WBC's in normal range.  This RN discussed with pt wbc alone is difficult to address due to need for additional labs that show more information. Murray did not have other readings.   This RN informed pt she may want to follow up with her primary care MD first - which also may be less expensive then coming into this office ( part of her benefits for yearly physical ).  Chasitty states she will follow up with her primary MD.  This RN informed pt to call if she has further concerns.

## 2021-01-28 ENCOUNTER — Encounter: Payer: Self-pay | Admitting: Oncology

## 2021-02-02 ENCOUNTER — Ambulatory Visit: Payer: Managed Care, Other (non HMO) | Admitting: Podiatry

## 2021-09-03 ENCOUNTER — Telehealth: Payer: Self-pay | Admitting: Internal Medicine

## 2021-09-03 NOTE — Telephone Encounter (Signed)
Pt called in requesting that she had not received her protonix at her 2021 visit with JP. Pt refused to set up an OV, and was advised that she could get protonix over the counter. No further questions.

## 2021-09-03 NOTE — Telephone Encounter (Signed)
Patient called and stated that never got the proscription for Protonix back in 2021 when Dr. Levie Heritage did her Endoscopy. I advised patient that she would need to come in for a office visit. Patient requested to speak with the nurse. Seeking advice, please advise.

## 2022-10-06 ENCOUNTER — Other Ambulatory Visit: Payer: Self-pay | Admitting: Obstetrics and Gynecology

## 2022-10-06 DIAGNOSIS — Z1231 Encounter for screening mammogram for malignant neoplasm of breast: Secondary | ICD-10-CM

## 2022-11-17 ENCOUNTER — Inpatient Hospital Stay: Admission: RE | Admit: 2022-11-17 | Payer: Managed Care, Other (non HMO) | Source: Ambulatory Visit

## 2023-03-03 ENCOUNTER — Encounter: Payer: Self-pay | Admitting: Oncology

## 2023-03-10 ENCOUNTER — Ambulatory Visit: Payer: Managed Care, Other (non HMO) | Admitting: Family Medicine

## 2023-07-10 ENCOUNTER — Encounter: Payer: Self-pay | Admitting: Internal Medicine

## 2024-03-01 ENCOUNTER — Ambulatory Visit: Payer: Self-pay | Admitting: Student in an Organized Health Care Education/Training Program

## 2024-03-08 DIAGNOSIS — N951 Menopausal and female climacteric states: Secondary | ICD-10-CM | POA: Diagnosis not present

## 2024-03-08 DIAGNOSIS — Z6821 Body mass index (BMI) 21.0-21.9, adult: Secondary | ICD-10-CM | POA: Diagnosis not present

## 2024-03-08 DIAGNOSIS — Z0000 Encounter for general adult medical examination without abnormal findings: Secondary | ICD-10-CM | POA: Diagnosis not present

## 2024-03-08 DIAGNOSIS — Z86018 Personal history of other benign neoplasm: Secondary | ICD-10-CM | POA: Diagnosis not present

## 2024-03-11 ENCOUNTER — Other Ambulatory Visit: Payer: Self-pay | Admitting: Physician Assistant

## 2024-03-11 DIAGNOSIS — Z780 Asymptomatic menopausal state: Secondary | ICD-10-CM

## 2024-03-22 DIAGNOSIS — E875 Hyperkalemia: Secondary | ICD-10-CM | POA: Diagnosis not present

## 2024-05-22 ENCOUNTER — Other Ambulatory Visit: Payer: Self-pay

## 2024-06-21 ENCOUNTER — Other Ambulatory Visit: Payer: Self-pay
